# Patient Record
Sex: Female | Born: 1935 | Race: Black or African American | Hispanic: No | Marital: Married | Smoking: Never smoker
Health system: Southern US, Community
[De-identification: ages and names within clinical notes are randomized; demographics above are authoritative.]

## PROBLEM LIST (undated history)

## (undated) DIAGNOSIS — M1990 Unspecified osteoarthritis, unspecified site: Secondary | ICD-10-CM

## (undated) DIAGNOSIS — E876 Hypokalemia: Secondary | ICD-10-CM

## (undated) DIAGNOSIS — E538 Deficiency of other specified B group vitamins: Secondary | ICD-10-CM

## (undated) DIAGNOSIS — N189 Chronic kidney disease, unspecified: Secondary | ICD-10-CM

## (undated) DIAGNOSIS — I10 Essential (primary) hypertension: Secondary | ICD-10-CM

## (undated) DIAGNOSIS — K219 Gastro-esophageal reflux disease without esophagitis: Secondary | ICD-10-CM

## (undated) DIAGNOSIS — M810 Age-related osteoporosis without current pathological fracture: Secondary | ICD-10-CM

## (undated) DIAGNOSIS — M109 Gout, unspecified: Secondary | ICD-10-CM

## (undated) DIAGNOSIS — E559 Vitamin D deficiency, unspecified: Secondary | ICD-10-CM

## (undated) HISTORY — DX: Hypokalemia: E87.6

## (undated) HISTORY — DX: Deficiency of other specified B group vitamins: E53.8

## (undated) HISTORY — PX: JOINT REPLACEMENT: SHX530

## (undated) HISTORY — DX: Gout, unspecified: M10.9

## (undated) HISTORY — DX: Vitamin D deficiency, unspecified: E55.9

## (undated) HISTORY — DX: Essential (primary) hypertension: I10

## (undated) HISTORY — DX: Chronic kidney disease, unspecified: N18.9

## (undated) HISTORY — PX: ABDOMINAL HYSTERECTOMY: SHX81

## (undated) HISTORY — DX: Gastro-esophageal reflux disease without esophagitis: K21.9

## (undated) HISTORY — DX: Unspecified osteoarthritis, unspecified site: M19.90

## (undated) HISTORY — DX: Age-related osteoporosis without current pathological fracture: M81.0

---

## 2008-12-16 ENCOUNTER — Encounter: Payer: Self-pay | Admitting: General Practice

## 2009-01-12 ENCOUNTER — Encounter: Payer: Self-pay | Admitting: General Practice

## 2009-07-06 ENCOUNTER — Ambulatory Visit: Payer: Self-pay | Admitting: General Practice

## 2009-07-22 ENCOUNTER — Inpatient Hospital Stay: Payer: Self-pay | Admitting: General Practice

## 2009-12-15 ENCOUNTER — Ambulatory Visit: Payer: Self-pay | Admitting: General Practice

## 2009-12-30 ENCOUNTER — Inpatient Hospital Stay: Payer: Self-pay | Admitting: General Practice

## 2010-01-05 ENCOUNTER — Encounter: Payer: Self-pay | Admitting: Internal Medicine

## 2010-01-12 ENCOUNTER — Encounter: Payer: Self-pay | Admitting: Internal Medicine

## 2011-06-29 ENCOUNTER — Ambulatory Visit: Payer: Self-pay

## 2011-12-20 DIAGNOSIS — Q809 Congenital ichthyosis, unspecified: Secondary | ICD-10-CM | POA: Diagnosis not present

## 2011-12-20 DIAGNOSIS — G5790 Unspecified mononeuropathy of unspecified lower limb: Secondary | ICD-10-CM | POA: Diagnosis not present

## 2011-12-20 DIAGNOSIS — L819 Disorder of pigmentation, unspecified: Secondary | ICD-10-CM | POA: Diagnosis not present

## 2012-01-16 DIAGNOSIS — R6889 Other general symptoms and signs: Secondary | ICD-10-CM | POA: Diagnosis not present

## 2012-01-16 DIAGNOSIS — E559 Vitamin D deficiency, unspecified: Secondary | ICD-10-CM | POA: Diagnosis not present

## 2012-01-16 DIAGNOSIS — I10 Essential (primary) hypertension: Secondary | ICD-10-CM | POA: Diagnosis not present

## 2012-01-16 DIAGNOSIS — J069 Acute upper respiratory infection, unspecified: Secondary | ICD-10-CM | POA: Diagnosis not present

## 2012-05-03 DIAGNOSIS — H2510 Age-related nuclear cataract, unspecified eye: Secondary | ICD-10-CM | POA: Diagnosis not present

## 2012-06-01 ENCOUNTER — Ambulatory Visit: Payer: Self-pay

## 2012-06-01 DIAGNOSIS — M810 Age-related osteoporosis without current pathological fracture: Secondary | ICD-10-CM | POA: Diagnosis not present

## 2012-06-01 DIAGNOSIS — M109 Gout, unspecified: Secondary | ICD-10-CM | POA: Diagnosis not present

## 2012-06-01 DIAGNOSIS — R918 Other nonspecific abnormal finding of lung field: Secondary | ICD-10-CM | POA: Diagnosis not present

## 2012-06-01 DIAGNOSIS — Z1289 Encounter for screening for malignant neoplasm of other sites: Secondary | ICD-10-CM | POA: Diagnosis not present

## 2012-06-01 DIAGNOSIS — R6889 Other general symptoms and signs: Secondary | ICD-10-CM | POA: Diagnosis not present

## 2012-06-01 DIAGNOSIS — J984 Other disorders of lung: Secondary | ICD-10-CM | POA: Diagnosis not present

## 2012-06-01 DIAGNOSIS — M549 Dorsalgia, unspecified: Secondary | ICD-10-CM | POA: Diagnosis not present

## 2012-06-01 DIAGNOSIS — E876 Hypokalemia: Secondary | ICD-10-CM | POA: Diagnosis not present

## 2012-06-01 DIAGNOSIS — M1710 Unilateral primary osteoarthritis, unspecified knee: Secondary | ICD-10-CM | POA: Diagnosis not present

## 2012-06-01 DIAGNOSIS — I10 Essential (primary) hypertension: Secondary | ICD-10-CM | POA: Diagnosis not present

## 2012-06-01 DIAGNOSIS — Z0000 Encounter for general adult medical examination without abnormal findings: Secondary | ICD-10-CM | POA: Diagnosis not present

## 2012-06-01 DIAGNOSIS — E559 Vitamin D deficiency, unspecified: Secondary | ICD-10-CM | POA: Diagnosis not present

## 2012-06-05 DIAGNOSIS — E559 Vitamin D deficiency, unspecified: Secondary | ICD-10-CM | POA: Diagnosis not present

## 2012-06-12 DIAGNOSIS — E559 Vitamin D deficiency, unspecified: Secondary | ICD-10-CM | POA: Diagnosis not present

## 2012-06-19 DIAGNOSIS — E559 Vitamin D deficiency, unspecified: Secondary | ICD-10-CM | POA: Diagnosis not present

## 2012-06-26 DIAGNOSIS — E559 Vitamin D deficiency, unspecified: Secondary | ICD-10-CM | POA: Diagnosis not present

## 2012-06-29 ENCOUNTER — Ambulatory Visit: Payer: Self-pay

## 2012-06-29 DIAGNOSIS — R928 Other abnormal and inconclusive findings on diagnostic imaging of breast: Secondary | ICD-10-CM | POA: Diagnosis not present

## 2012-06-29 DIAGNOSIS — Z1231 Encounter for screening mammogram for malignant neoplasm of breast: Secondary | ICD-10-CM | POA: Diagnosis not present

## 2012-07-27 DIAGNOSIS — E559 Vitamin D deficiency, unspecified: Secondary | ICD-10-CM | POA: Diagnosis not present

## 2012-08-30 DIAGNOSIS — E559 Vitamin D deficiency, unspecified: Secondary | ICD-10-CM | POA: Diagnosis not present

## 2012-09-28 DIAGNOSIS — E559 Vitamin D deficiency, unspecified: Secondary | ICD-10-CM | POA: Diagnosis not present

## 2012-10-30 DIAGNOSIS — E559 Vitamin D deficiency, unspecified: Secondary | ICD-10-CM | POA: Diagnosis not present

## 2012-12-03 DIAGNOSIS — E538 Deficiency of other specified B group vitamins: Secondary | ICD-10-CM | POA: Diagnosis not present

## 2012-12-03 DIAGNOSIS — I10 Essential (primary) hypertension: Secondary | ICD-10-CM | POA: Diagnosis not present

## 2013-01-30 DIAGNOSIS — L989 Disorder of the skin and subcutaneous tissue, unspecified: Secondary | ICD-10-CM | POA: Diagnosis not present

## 2013-01-30 DIAGNOSIS — L280 Lichen simplex chronicus: Secondary | ICD-10-CM | POA: Diagnosis not present

## 2013-01-30 DIAGNOSIS — L738 Other specified follicular disorders: Secondary | ICD-10-CM | POA: Diagnosis not present

## 2013-06-04 DIAGNOSIS — I10 Essential (primary) hypertension: Secondary | ICD-10-CM | POA: Diagnosis not present

## 2013-06-04 DIAGNOSIS — Z0000 Encounter for general adult medical examination without abnormal findings: Secondary | ICD-10-CM | POA: Diagnosis not present

## 2013-06-04 DIAGNOSIS — E538 Deficiency of other specified B group vitamins: Secondary | ICD-10-CM | POA: Diagnosis not present

## 2013-06-04 DIAGNOSIS — E7800 Pure hypercholesterolemia, unspecified: Secondary | ICD-10-CM | POA: Diagnosis not present

## 2013-07-02 ENCOUNTER — Ambulatory Visit: Payer: Self-pay

## 2013-07-02 DIAGNOSIS — Z1231 Encounter for screening mammogram for malignant neoplasm of breast: Secondary | ICD-10-CM | POA: Diagnosis not present

## 2013-08-22 DIAGNOSIS — H35369 Drusen (degenerative) of macula, unspecified eye: Secondary | ICD-10-CM | POA: Diagnosis not present

## 2013-09-30 DIAGNOSIS — L989 Disorder of the skin and subcutaneous tissue, unspecified: Secondary | ICD-10-CM | POA: Diagnosis not present

## 2013-11-19 DIAGNOSIS — E538 Deficiency of other specified B group vitamins: Secondary | ICD-10-CM | POA: Diagnosis not present

## 2013-11-19 DIAGNOSIS — M109 Gout, unspecified: Secondary | ICD-10-CM | POA: Diagnosis not present

## 2013-11-19 DIAGNOSIS — E559 Vitamin D deficiency, unspecified: Secondary | ICD-10-CM | POA: Diagnosis not present

## 2013-11-19 DIAGNOSIS — I10 Essential (primary) hypertension: Secondary | ICD-10-CM | POA: Diagnosis not present

## 2013-11-19 DIAGNOSIS — I129 Hypertensive chronic kidney disease with stage 1 through stage 4 chronic kidney disease, or unspecified chronic kidney disease: Secondary | ICD-10-CM | POA: Diagnosis not present

## 2013-12-18 DIAGNOSIS — J40 Bronchitis, not specified as acute or chronic: Secondary | ICD-10-CM | POA: Diagnosis not present

## 2013-12-27 ENCOUNTER — Ambulatory Visit: Payer: Self-pay

## 2013-12-27 DIAGNOSIS — K7689 Other specified diseases of liver: Secondary | ICD-10-CM | POA: Diagnosis not present

## 2013-12-27 DIAGNOSIS — J984 Other disorders of lung: Secondary | ICD-10-CM | POA: Diagnosis not present

## 2013-12-27 DIAGNOSIS — R918 Other nonspecific abnormal finding of lung field: Secondary | ICD-10-CM | POA: Diagnosis not present

## 2013-12-27 DIAGNOSIS — N183 Chronic kidney disease, stage 3 unspecified: Secondary | ICD-10-CM | POA: Diagnosis not present

## 2013-12-31 DIAGNOSIS — J189 Pneumonia, unspecified organism: Secondary | ICD-10-CM | POA: Diagnosis not present

## 2014-05-21 DIAGNOSIS — E559 Vitamin D deficiency, unspecified: Secondary | ICD-10-CM | POA: Diagnosis not present

## 2014-05-21 DIAGNOSIS — R7301 Impaired fasting glucose: Secondary | ICD-10-CM | POA: Diagnosis not present

## 2014-05-21 DIAGNOSIS — I10 Essential (primary) hypertension: Secondary | ICD-10-CM | POA: Diagnosis not present

## 2014-05-21 DIAGNOSIS — E538 Deficiency of other specified B group vitamins: Secondary | ICD-10-CM | POA: Diagnosis not present

## 2014-05-21 DIAGNOSIS — Z01419 Encounter for gynecological examination (general) (routine) without abnormal findings: Secondary | ICD-10-CM | POA: Diagnosis not present

## 2014-05-21 DIAGNOSIS — M8440XA Pathological fracture, unspecified site, initial encounter for fracture: Secondary | ICD-10-CM | POA: Diagnosis not present

## 2014-05-21 DIAGNOSIS — I129 Hypertensive chronic kidney disease with stage 1 through stage 4 chronic kidney disease, or unspecified chronic kidney disease: Secondary | ICD-10-CM | POA: Diagnosis not present

## 2014-05-21 DIAGNOSIS — Z0000 Encounter for general adult medical examination without abnormal findings: Secondary | ICD-10-CM | POA: Diagnosis not present

## 2014-05-21 DIAGNOSIS — N183 Chronic kidney disease, stage 3 unspecified: Secondary | ICD-10-CM | POA: Diagnosis not present

## 2014-06-03 DIAGNOSIS — Z1211 Encounter for screening for malignant neoplasm of colon: Secondary | ICD-10-CM | POA: Diagnosis not present

## 2014-06-26 ENCOUNTER — Ambulatory Visit: Payer: Self-pay | Admitting: Gastroenterology

## 2014-06-26 DIAGNOSIS — M810 Age-related osteoporosis without current pathological fracture: Secondary | ICD-10-CM | POA: Diagnosis not present

## 2014-06-26 DIAGNOSIS — D126 Benign neoplasm of colon, unspecified: Secondary | ICD-10-CM | POA: Diagnosis not present

## 2014-06-26 DIAGNOSIS — K5730 Diverticulosis of large intestine without perforation or abscess without bleeding: Secondary | ICD-10-CM | POA: Diagnosis not present

## 2014-06-26 DIAGNOSIS — K219 Gastro-esophageal reflux disease without esophagitis: Secondary | ICD-10-CM | POA: Diagnosis not present

## 2014-06-26 DIAGNOSIS — I10 Essential (primary) hypertension: Secondary | ICD-10-CM | POA: Diagnosis not present

## 2014-06-26 DIAGNOSIS — Z96659 Presence of unspecified artificial knee joint: Secondary | ICD-10-CM | POA: Diagnosis not present

## 2014-06-26 DIAGNOSIS — Z79899 Other long term (current) drug therapy: Secondary | ICD-10-CM | POA: Diagnosis not present

## 2014-06-26 DIAGNOSIS — M109 Gout, unspecified: Secondary | ICD-10-CM | POA: Diagnosis not present

## 2014-06-26 DIAGNOSIS — Z1211 Encounter for screening for malignant neoplasm of colon: Secondary | ICD-10-CM | POA: Diagnosis not present

## 2014-06-26 DIAGNOSIS — M4120 Other idiopathic scoliosis, site unspecified: Secondary | ICD-10-CM | POA: Diagnosis not present

## 2014-06-27 LAB — PATHOLOGY REPORT

## 2014-09-04 ENCOUNTER — Ambulatory Visit: Payer: Self-pay

## 2014-09-04 DIAGNOSIS — Z1231 Encounter for screening mammogram for malignant neoplasm of breast: Secondary | ICD-10-CM | POA: Diagnosis not present

## 2014-12-02 DIAGNOSIS — E538 Deficiency of other specified B group vitamins: Secondary | ICD-10-CM | POA: Diagnosis not present

## 2014-12-02 DIAGNOSIS — M19049 Primary osteoarthritis, unspecified hand: Secondary | ICD-10-CM | POA: Diagnosis not present

## 2014-12-02 DIAGNOSIS — R739 Hyperglycemia, unspecified: Secondary | ICD-10-CM | POA: Diagnosis not present

## 2014-12-02 DIAGNOSIS — I10 Essential (primary) hypertension: Secondary | ICD-10-CM | POA: Diagnosis not present

## 2015-05-28 ENCOUNTER — Other Ambulatory Visit: Payer: Self-pay | Admitting: Family Medicine

## 2015-06-02 ENCOUNTER — Telehealth: Payer: Self-pay | Admitting: Unknown Physician Specialty

## 2015-06-02 MED ORDER — LANSOPRAZOLE 30 MG PO CPDR: 30.0000 mg | DELAYED_RELEASE_CAPSULE | Freq: Every day | ORAL | Status: DC

## 2015-06-02 NOTE — Telephone Encounter (Signed)
Pt called stated she needs refill on Lansoprazole. Pharm is [facility]. Thanks.

## 2015-06-02 NOTE — Telephone Encounter (Signed)
Called and spoke to patient about medication. Patient stated she does not take omeprazole, she used to take prevacid which she stated is a substitute for the lansoprazole. She stated she takes one pill a day and that she got it from here.

## 2015-06-02 NOTE — Telephone Encounter (Signed)
Routing to provider  

## 2015-06-02 NOTE — Telephone Encounter (Signed)
It's not on the list.  Omeprazole is on the list.  Which is she on?  How much does she take? Did she get it from somewhere else?

## 2015-06-10 ENCOUNTER — Encounter: Payer: Self-pay | Admitting: Unknown Physician Specialty

## 2015-06-10 ENCOUNTER — Other Ambulatory Visit: Payer: Self-pay | Admitting: Unknown Physician Specialty

## 2015-06-10 ENCOUNTER — Telehealth: Payer: Self-pay

## 2015-06-10 ENCOUNTER — Ambulatory Visit (INDEPENDENT_AMBULATORY_CARE_PROVIDER_SITE_OTHER): Payer: Medicare Other | Admitting: Unknown Physician Specialty

## 2015-06-10 DIAGNOSIS — E538 Deficiency of other specified B group vitamins: Secondary | ICD-10-CM | POA: Insufficient documentation

## 2015-06-10 DIAGNOSIS — N183 Chronic kidney disease, stage 3 unspecified: Secondary | ICD-10-CM | POA: Insufficient documentation

## 2015-06-10 DIAGNOSIS — E559 Vitamin D deficiency, unspecified: Secondary | ICD-10-CM | POA: Insufficient documentation

## 2015-06-10 DIAGNOSIS — Z0000 Encounter for general adult medical examination without abnormal findings: Secondary | ICD-10-CM

## 2015-06-10 DIAGNOSIS — N644 Mastodynia: Secondary | ICD-10-CM

## 2015-06-10 DIAGNOSIS — M109 Gout, unspecified: Secondary | ICD-10-CM | POA: Insufficient documentation

## 2015-06-10 DIAGNOSIS — Z1239 Encounter for other screening for malignant neoplasm of breast: Secondary | ICD-10-CM

## 2015-06-10 DIAGNOSIS — I129 Hypertensive chronic kidney disease with stage 1 through stage 4 chronic kidney disease, or unspecified chronic kidney disease: Secondary | ICD-10-CM | POA: Diagnosis not present

## 2015-06-10 DIAGNOSIS — I10 Essential (primary) hypertension: Secondary | ICD-10-CM | POA: Insufficient documentation

## 2015-06-10 DIAGNOSIS — R300 Dysuria: Secondary | ICD-10-CM

## 2015-06-10 LAB — UA/M W/RFLX CULTURE, ROUTINE
BILIRUBIN UA: NEGATIVE
GLUCOSE, UA: NEGATIVE
Ketones, UA: NEGATIVE
Leukocytes, UA: NEGATIVE
Nitrite, UA: NEGATIVE
PROTEIN UA: NEGATIVE
RBC UA: NEGATIVE
Specific Gravity, UA: 1.025 (ref 1.005–1.030)
UUROB: 0.2 mg/dL (ref 0.2–1.0)
pH, UA: 5 (ref 5.0–7.5)

## 2015-06-10 MED ORDER — LANSOPRAZOLE 30 MG PO CPDR: 30.0000 mg | DELAYED_RELEASE_CAPSULE | Freq: Every day | ORAL | Status: DC

## 2015-06-10 MED ORDER — NIFEDIPINE ER 60 MG PO TB24: 60.0000 mg | ORAL_TABLET | Freq: Every day | ORAL | Status: DC

## 2015-06-10 NOTE — Progress Notes (Signed)
Patient: Cynthia Hurst, Female    DOB: 16-Mar-1935, 80 y.o.   MRN: [id number]  Visit Date: 06/10/2015  Today's Provider: Kathrine Haddock, NP   Chief Complaint  Patient presents with  . Medicare Wellness    Subjective:   Cynthia Hurst is a 80 y.o. female who presents today for her Subsequent Annual Wellness Visit.   Hypertension This is a chronic (High today.  108/68 last time checked at home) problem. Condition status: Has been stable but high this AM. Pertinent negatives include no anxiety, blurred vision, chest pain, headaches, malaise/fatigue, neck pain, orthopnea, palpitations, peripheral edema, PND, shortness of breath or sweats. There are no associated agents to hypertension. There are no compliance problems.     Review of Systems  Constitutional: Negative for malaise/fatigue.  Eyes: Negative for blurred vision.  Respiratory: Negative for shortness of breath.   Cardiovascular: Negative for chest pain, palpitations, orthopnea and PND.  Musculoskeletal: Negative for neck pain.  Neurological: Negative for headaches.    @MEDHX @  Past Surgical History  Procedure Laterality Date  . Joint replacement  [id number]    KNEE  . Abdominal hysterectomy      PARTIAL    Family History  Problem Relation Age of Onset  . Heart disease Mother   . Cancer Father   . Mental illness Father     History   Social History  . Marital Status: Married    Spouse Name: N/A  . Number of Children: N/A  . Years of Education: N/A   Occupational History  . Not on file.   Social History Main Topics  . Smoking status: Never Smoker   . Smokeless tobacco: Never Used  . Alcohol Use: No  . Drug Use: No  . Sexual Activity: No   Other Topics Concern  . Not on file   Social History Narrative    Outpatient Encounter Prescriptions as of 06/10/2015  Medication Sig  . allopurinol (ZYLOPRIM) 300 MG tablet Take 300 mg by mouth daily.  Marland Kitchen atenolol (TENORMIN) 100 MG tablet Take 100 mg by mouth  daily.  . cholecalciferol (VITAMIN D) 1000 UNITS tablet Take 1,000 Units by mouth daily.  . Cyanocobalamin (VITAMIN B 12 PO) Take 1 capsule by mouth daily.  . lansoprazole (PREVACID) 30 MG capsule Take 1 capsule (30 mg total) by mouth daily at 12 noon.  Marland Kitchen NIFEdipine (PROCARDIA-XL/ADALAT CC) 60 MG 24 hr tablet Take 1 tablet (60 mg total) by mouth daily.  . potassium chloride SA (K-DUR,KLOR-CON) 20 MEQ tablet Take 20 mEq by mouth daily.  . raloxifene (EVISTA) 60 MG tablet TAKE ONE TABLET BY MOUTH ONCE DAILY  . [DISCONTINUED] lansoprazole (PREVACID) 30 MG capsule Take 1 capsule (30 mg total) by mouth daily at 12 noon.  . [DISCONTINUED] NIFEdipine (PROCARDIA-XL/ADALAT CC) 60 MG 24 hr tablet Take 60 mg by mouth daily.   No facility-administered encounter medications on file as of 06/10/2015.    Functional Ability / Safety Screening 1.  Was the timed Get Up and Go test longer than 30 seconds?  no 2.  Does the patient need help with the phone, transportation, shopping,      preparing meals, housework, laundry, medications, or managing money?  no 3.  Does the patient's home have:  loose throw rugs in the hallway?   no      Grab bars in the bathroom? no      Handrails on the stairs?   no      Poor lighting?   no  4.  Has the patient noticed any hearing difficulties?   no  Fall Risk Assessment See under rooming  Depression Screen See under rooming Depression screen [facility] 2/9 06/10/2015  Decreased Interest 0  Down, Depressed, Hopeless 0  PHQ - 2 Score 0    Advanced Directives Does patient have a HCPOA?    yes If yes, name and contact information:  Does patient have a living will or MOST form?  yes  Objective:   Vitals: BP 140/75 mmHg  Pulse 60  Temp(Src) 97.6 F (36.4 C)  Ht 5\' 3"  (1.6 m)  Wt 188 lb 9.6 oz (85.548 kg)  BMI 33.42 kg/m2  SpO2 93%  LMP  (LMP Unknown) Body mass index is 33.42 kg/(m^2). No exam data present   BP 140/75 on recheck  Physical Exam  Constitutional: She  is oriented to person, place, and time. She appears well-developed and well-nourished. No distress.  HENT:  Head: Normocephalic and atraumatic.  Eyes: Conjunctivae and lids are normal. Right eye exhibits no discharge. Left eye exhibits no discharge. No scleral icterus.  Cardiovascular: Normal rate and regular rhythm.   Pulmonary/Chest: Effort normal and breath sounds normal. No respiratory distress.  Abdominal: Normal appearance and bowel sounds are normal. She exhibits no distension. There is no splenomegaly or hepatomegaly. There is no tenderness.  Musculoskeletal: Normal range of motion.  Neurological: She is alert and oriented to person, place, and time.  Skin: Skin is intact. No rash noted. No pallor.  Psychiatric: She has a normal mood and affect. Her behavior is normal. Judgment and thought content normal.   Mood/affect:  normal Appearance:  normal  Cognitive Testing - 6-CIT  Correct? Score   What year is it? yes 0 Yes = 0    No = 4  What month is it? yes 0 Yes = 0    No = 3  Remember:     [place]ia [place], [place], [place]     What time is it? yes 0 Yes = 0    No = 3  Count backwards from 20 to 1 yes 0 Correct = 0    1 error = 2   More than 1 error = 4  Say the months of the year in reverse. yes 0 Correct = 0    1 error = 2   More than 1 error = 4  What address did I ask you to remember? yes 0 Correct = 0  1 error = 2    2 error = 4    3 error = 6    4 error = 8    All wrong = 10       TOTAL SCORE  0/28   Interpretation:  Normal  Normal (0-7) Abnormal (8-28)    Assessment & Plan:     Annual Wellness Visit  Reviewed patient's Family Medical History Reviewed and updated list of patient's medical providers Assessment of cognitive impairment was done Assessed patient's functional ability Established a written schedule for health screening [place] Completed and Reviewed  Exercise Activities and Dietary recommendations Goals    None       Immunization History  Administered Date(s) Administered  . Pneumococcal Polysaccharide-23 03/23/2005    Health Maintenance  Topic Date Due  . TETANUS/TDAP  01/28/1956  . ZOSTAVAX  01/27/1997  . DEXA SCAN  01/27/2002  . PNA vac Low Risk Adult (2 of 2 - PCV13) 03/23/2006  . INFLUENZA VACCINE  07/13/2015  .  COLONOSCOPY  07/02/2024     Discussed health benefits of physical activity, and encouraged her to engage in regular exercise appropriate for her age and condition.     Meds ordered this encounter  Medications  . NIFEdipine (PROCARDIA-XL/ADALAT CC) 60 MG 24 hr tablet    Sig: Take 1 tablet (60 mg total) by mouth daily.    Dispense:  15 tablet    Refill:  0  . lansoprazole (PREVACID) 30 MG capsule    Sig: Take 1 capsule (30 mg total) by mouth daily at 12 noon.    Dispense:  15 capsule    Refill:  0    Current outpatient prescriptions:  .  allopurinol (ZYLOPRIM) 300 MG tablet, Take 300 mg by mouth daily., Disp: , Rfl:  .  atenolol (TENORMIN) 100 MG tablet, Take 100 mg by mouth daily., Disp: , Rfl:  .  cholecalciferol (VITAMIN D) 1000 UNITS tablet, Take 1,000 Units by mouth daily., Disp: , Rfl:  .  Cyanocobalamin (VITAMIN B 12 PO), Take 1 capsule by mouth daily., Disp: , Rfl:  .  lansoprazole (PREVACID) 30 MG capsule, Take 1 capsule (30 mg total) by mouth daily at 12 noon., Disp: 15 capsule, Rfl: 0 .  NIFEdipine (PROCARDIA-XL/ADALAT CC) 60 MG 24 hr tablet, Take 1 tablet (60 mg total) by mouth daily., Disp: 15 tablet, Rfl: 0 .  potassium chloride SA (K-DUR,KLOR-CON) 20 MEQ tablet, Take 20 mEq by mouth daily., Disp: , Rfl:  .  raloxifene (EVISTA) 60 MG tablet, TAKE ONE TABLET BY MOUTH ONCE DAILY, Disp: 90 tablet, Rfl: 0 Medications Discontinued During This Encounter  Medication Reason  . NIFEdipine (PROCARDIA-XL/ADALAT CC) 60 MG 24 hr tablet Reorder  . lansoprazole (PREVACID) 30 MG capsule Reorder    Next Medicare Wellness Visit in 12+ months

## 2015-06-10 NOTE — Telephone Encounter (Signed)
Patient called and stated that her prescriptions were sent to the wrong pharmacy. So I asked cheryl and got them sent to the right pharmacy.

## 2015-06-10 NOTE — Assessment & Plan Note (Signed)
Stable today on recheck

## 2015-06-10 NOTE — Telephone Encounter (Signed)
Called and let patient know that her medications were available at the [facility] on [address].

## 2015-06-11 ENCOUNTER — Telehealth: Payer: Self-pay

## 2015-06-11 ENCOUNTER — Other Ambulatory Visit: Payer: Self-pay | Admitting: Unknown Physician Specialty

## 2015-06-11 DIAGNOSIS — Z1239 Encounter for other screening for malignant neoplasm of breast: Secondary | ICD-10-CM

## 2015-06-11 LAB — COMPREHENSIVE METABOLIC PANEL
A/G RATIO: 1.5 (ref 1.1–2.5)
ALK PHOS: 97 IU/L (ref 39–117)
ALT: 10 IU/L (ref 0–32)
AST: 19 IU/L (ref 0–40)
Albumin: 3.9 g/dL (ref 3.5–4.8)
BUN / CREAT RATIO: 14 (ref 11–26)
BUN: 14 mg/dL (ref 8–27)
Bilirubin Total: 0.6 mg/dL (ref 0.0–1.2)
CHLORIDE: 102 mmol/L (ref 97–108)
CO2: 27 mmol/L (ref 18–29)
Calcium: 9.9 mg/dL (ref 8.7–10.3)
Creatinine, Ser: 1.02 mg/dL — ABNORMAL HIGH (ref 0.57–1.00)
GFR calc Af Amer: 61 mL/min/{1.73_m2} (ref >59)
GFR, EST NON AFRICAN AMERICAN: 53 mL/min/{1.73_m2} — AB (ref >59)
GLUCOSE: 87 mg/dL (ref 65–99)
Globulin, Total: 2.6 g/dL (ref 1.5–4.5)
POTASSIUM: 4.2 mmol/L (ref 3.5–5.2)
SODIUM: 141 mmol/L (ref 134–144)
Total Protein: 6.5 g/dL (ref 6.0–8.5)

## 2015-06-11 LAB — LIPID PANEL W/O CHOL/HDL RATIO
Cholesterol, Total: 153 mg/dL (ref 100–199)
HDL: 56 mg/dL (ref >39)
LDL Calculated: 71 mg/dL (ref 0–99)
TRIGLYCERIDES: 131 mg/dL (ref 0–149)
VLDL Cholesterol Cal: 26 mg/dL (ref 5–40)

## 2015-06-11 LAB — URIC ACID: URIC ACID: 3.4 mg/dL (ref 2.5–7.1)

## 2015-06-11 NOTE — Telephone Encounter (Signed)
Called [facility] home delivery and discontinued medications.

## 2015-06-16 ENCOUNTER — Other Ambulatory Visit: Payer: Self-pay | Admitting: Unknown Physician Specialty

## 2015-06-16 DIAGNOSIS — Z1231 Encounter for screening mammogram for malignant neoplasm of breast: Secondary | ICD-10-CM

## 2015-06-18 ENCOUNTER — Telehealth: Payer: Self-pay | Admitting: Unknown Physician Specialty

## 2015-06-18 NOTE — Telephone Encounter (Signed)
Pt called stated at her last visit she informed Malachy Mood she was out of her medication and was told Malachy Mood would fax it in. Pharm stated they have not yet received it. Pt has been without her medication since her appt.  Medication: Iraloxifene   Pharm: [facility].

## 2015-06-19 NOTE — Telephone Encounter (Signed)
Called patient's pharmacy and asked them about the medication, and the pharmacist told me that the medication was shipped on Tuesday June 16, 2015. So I called and informed the patient about this and let her ow she should be getting the medication soon. I also informed the patient that her mammogram was ordered and gave her the number to call and schedule her appointment.

## 2015-07-09 ENCOUNTER — Telehealth: Payer: Self-pay

## 2015-07-09 NOTE — Telephone Encounter (Signed)
-----   Message from Cynthia Haddock, NP sent at 07/09/2015  9:25 AM EDT ----- Please follow up on her mammogram that is marked overdue

## 2015-07-09 NOTE — Telephone Encounter (Signed)
Patient is not overdue for mammogram as it was done in September of 2015.

## 2015-07-15 ENCOUNTER — Telehealth: Payer: Self-pay | Admitting: Unknown Physician Specialty

## 2015-07-15 MED ORDER — LANSOPRAZOLE 30 MG PO CPDR: 30.0000 mg | DELAYED_RELEASE_CAPSULE | Freq: Every day | ORAL | Status: DC

## 2015-07-15 NOTE — Telephone Encounter (Signed)
Pt received 30 day supply of her prevacaid 30mg , normally she gets 90 days.  She is going out of town and does not have enough medication.  Please call her mail order [phone].

## 2015-07-15 NOTE — Telephone Encounter (Signed)
Tried to call ad ask patient where medication needed to be sent to but patient was not available. The gentleman I spoke to said that the patient would call us back when she got in.

## 2015-07-15 NOTE — Telephone Encounter (Signed)
I wrote for 90.  But, not sure where to send it to.

## 2015-07-17 MED ORDER — LANSOPRAZOLE 30 MG PO CPDR: 30.0000 mg | DELAYED_RELEASE_CAPSULE | Freq: Every day | ORAL | Status: DC

## 2015-07-17 NOTE — Telephone Encounter (Signed)
Patient returned call and stated that [facility] needed [facility] so they can figure out what's going on with this patients medication.

## 2015-07-17 NOTE — Telephone Encounter (Signed)
Called [facility] mail order and they stated they the rx can be sent to the local pharmacy and the patient can pick it up their since she is going out of town. Please send the 90 day supply to [facility] on [address].

## 2015-08-10 DIAGNOSIS — H2513 Age-related nuclear cataract, bilateral: Secondary | ICD-10-CM | POA: Diagnosis not present

## 2015-09-01 ENCOUNTER — Other Ambulatory Visit: Payer: Self-pay | Admitting: Family Medicine

## 2015-09-07 ENCOUNTER — Other Ambulatory Visit: Payer: Self-pay | Admitting: Unknown Physician Specialty

## 2015-09-07 ENCOUNTER — Ambulatory Visit
Admission: RE | Admit: 2015-09-07 | Discharge: 2015-09-07 | Disposition: A | Discharge status: Home / Self Care | Payer: Medicare Other | Source: Ambulatory Visit | Attending: Unknown Physician Specialty | Admitting: Unknown Physician Specialty

## 2015-09-07 DIAGNOSIS — Z1231 Encounter for screening mammogram for malignant neoplasm of breast: Secondary | ICD-10-CM | POA: Diagnosis not present

## 2015-09-07 MED ORDER — NIFEDIPINE ER 60 MG PO TB24: 60.0000 mg | ORAL_TABLET | Freq: Every day | ORAL | Status: DC

## 2015-09-07 NOTE — Telephone Encounter (Signed)
Pt called needs refill on: raloxitene, and nifedipine. Pharm is [facility]. Thanks.

## 2015-09-07 NOTE — Telephone Encounter (Signed)
Dr. Jeananne Rama just refilled the raloxifine on 09/01/15. Patient was last seen 06/10/15 and pharmacy is the [facility].

## 2015-09-08 ENCOUNTER — Telehealth: Payer: Self-pay | Admitting: Unknown Physician Specialty

## 2015-09-08 MED ORDER — NIFEDIPINE ER 60 MG PO TB24: 60.0000 mg | ORAL_TABLET | Freq: Every day | ORAL | Status: DC

## 2015-09-08 NOTE — Telephone Encounter (Signed)
Called and let patient know rx was re-written for 90 day supply.

## 2015-09-08 NOTE — Telephone Encounter (Signed)
Pt called concerned that the medication that was sent in for her was only a 30 day supply, she normally gets 90 day supply (nifedipine). Please call pt ASAP. Thanks.

## 2015-09-08 NOTE — Telephone Encounter (Signed)
Rewritten for #90

## 2015-09-08 NOTE — Telephone Encounter (Signed)
Routing to provider. 15 day supply was written yesterday, patient requests 90 day supply.

## 2015-10-07 ENCOUNTER — Telehealth: Payer: Self-pay | Admitting: Unknown Physician Specialty

## 2015-10-07 NOTE — Telephone Encounter (Signed)
Pt would like a call back so she can verify the rx for her vitamin b12

## 2015-10-08 NOTE — Telephone Encounter (Signed)
Called and spoke to patient about medication. She states she gets OTC vitamin b12 and when she took the last pill, she accidentally threw the bottle away so she called to find out the dosage of the vitamin b12. I looked in her chart and let her know that it was 1000 mcg.

## 2015-10-30 ENCOUNTER — Other Ambulatory Visit: Payer: Self-pay | Admitting: Family Medicine

## 2015-10-30 ENCOUNTER — Other Ambulatory Visit: Payer: Self-pay | Admitting: Unknown Physician Specialty

## 2015-10-30 NOTE — Telephone Encounter (Signed)
LAST VISIT: 06/10/2015 UPCOMING APPT: 12/11/2015  Request for allopurinol 300 mg tab

## 2015-11-19 ENCOUNTER — Other Ambulatory Visit: Payer: Self-pay | Admitting: Unknown Physician Specialty

## 2015-11-24 ENCOUNTER — Other Ambulatory Visit: Payer: Self-pay | Admitting: Unknown Physician Specialty

## 2015-11-24 MED ORDER — RALOXIFENE HCL 60 MG PO TABS: 60.0000 mg | ORAL_TABLET | Freq: Every day | ORAL | Status: DC

## 2015-11-24 NOTE — Telephone Encounter (Signed)
Patient has upcoming appointment 12/11/15 and pharmacy is [facility].

## 2015-11-24 NOTE — Telephone Encounter (Signed)
Pt came by stated she needs a refill on Raloxifene. Pharm is [facility]. Thanks.

## 2015-12-11 ENCOUNTER — Encounter: Payer: Self-pay | Admitting: Unknown Physician Specialty

## 2015-12-11 ENCOUNTER — Ambulatory Visit (INDEPENDENT_AMBULATORY_CARE_PROVIDER_SITE_OTHER): Payer: Medicare Other | Admitting: Unknown Physician Specialty

## 2015-12-11 VITALS — BP 129/74 | HR 59 | Temp 97.4°F | Ht 62.7 in | Wt 183.8 lb

## 2015-12-11 DIAGNOSIS — E785 Hyperlipidemia, unspecified: Secondary | ICD-10-CM | POA: Diagnosis not present

## 2015-12-11 DIAGNOSIS — N183 Chronic kidney disease, stage 3 unspecified: Secondary | ICD-10-CM

## 2015-12-11 DIAGNOSIS — M109 Gout, unspecified: Secondary | ICD-10-CM | POA: Diagnosis not present

## 2015-12-11 DIAGNOSIS — R252 Cramp and spasm: Secondary | ICD-10-CM | POA: Diagnosis not present

## 2015-12-11 DIAGNOSIS — I129 Hypertensive chronic kidney disease with stage 1 through stage 4 chronic kidney disease, or unspecified chronic kidney disease: Secondary | ICD-10-CM | POA: Diagnosis not present

## 2015-12-11 DIAGNOSIS — N189 Chronic kidney disease, unspecified: Secondary | ICD-10-CM | POA: Diagnosis not present

## 2015-12-11 LAB — LIPID PANEL PICCOLO, WAIVED
CHOL/HDL RATIO PICCOLO,WAIVE: 2.9 mg/dL
CHOLESTEROL PICCOLO, WAIVED: 170 mg/dL (ref <200)
HDL CHOL PICCOLO, WAIVED: 58 mg/dL (ref >59)
LDL CHOL CALC PICCOLO WAIVED: 76 mg/dL (ref <100)
TRIGLYCERIDES PICCOLO,WAIVED: 179 mg/dL — AB (ref <150)
VLDL CHOL CALC PICCOLO,WAIVE: 36 mg/dL — AB (ref <30)

## 2015-12-11 LAB — MICROALBUMIN, URINE WAIVED
Creatinine, Urine Waived: 300 mg/dL (ref 10–300)
Microalb, Ur Waived: 30 mg/L — ABNORMAL HIGH (ref 0–19)

## 2015-12-11 MED ORDER — ATENOLOL 100 MG PO TABS: 100.0000 mg | ORAL_TABLET | Freq: Every day | ORAL | Status: DC

## 2015-12-11 MED ORDER — OMEPRAZOLE 20 MG PO CPDR: 20.0000 mg | DELAYED_RELEASE_CAPSULE | Freq: Every day | ORAL | Status: DC

## 2015-12-11 MED ORDER — ALLOPURINOL 300 MG PO TABS: 300.0000 mg | ORAL_TABLET | Freq: Every day | ORAL | Status: DC

## 2015-12-11 MED ORDER — RALOXIFENE HCL 60 MG PO TABS: 60.0000 mg | ORAL_TABLET | Freq: Every day | ORAL | Status: DC

## 2015-12-11 MED ORDER — NIFEDIPINE ER 60 MG PO TB24: 60.0000 mg | ORAL_TABLET | Freq: Every day | ORAL | Status: DC

## 2015-12-11 MED ORDER — POTASSIUM CHLORIDE CRYS ER 20 MEQ PO TBCR: 20.0000 meq | EXTENDED_RELEASE_TABLET | Freq: Every day | ORAL | Status: DC

## 2015-12-11 NOTE — Assessment & Plan Note (Signed)
Stable, continue present medications.   

## 2015-12-11 NOTE — Assessment & Plan Note (Signed)
OTC treatments discussed

## 2015-12-11 NOTE — Assessment & Plan Note (Signed)
Check uric acid. 

## 2015-12-11 NOTE — Patient Instructions (Signed)
f you have chronic pain and are looking for alternatives to medication and surgery, you have a lot of options.   However, not all alternative pain treatments work. Some can even be risky. Some alternative treatments may help with pain from bad backs, osteoarthritis, and headaches, but have no effect on chronic pain from fibromyalgia or diabetic nerve damage.  Here's a rundown of the most commonly used alternative treatments for chronic pain.  Acupuncture. Once seen as bizarre, acupuncture is rapidly becoming a mainstream treatment for pain. Studies have found that it works for pain caused by many conditions, including fibromyalgia, osteoarthritis, back injuries, and sports injuries. How does it work? No one's quite sure. It could release pain-numbing chemicals in the body. Or it might block the pain signals coming from the nerves.  Exercise. Motion is lotion Going for a walk or swim isn't a treatment, exactly. But regular physical activity has big benefits for people with many different painful conditions. Study after study has found that physical activity can help relieve chronic pain, as well as boost energy and mood.   This is particularly true of pain related to arthritis .    Chiropractic manipulation. Although mainstream medicine has traditionally regarded spinal manipulation with suspicion, it's becoming a more accepted treatment.  I think many people respond will th chiropractic treatment.    Supplements and vitamins. There is evidence that certain dietary supplements and vitamins can help with certain types of pain. Fish oil and flaxseed oil is often used to reduce pain associated with swelling. Topical capsaicin, derived from chili peppers, may help with arthritis, diabetic nerve pain, and other conditions. There's evidence that glucosamine can help relieve moderate to severe pain from osteoarthritis in the knee.  A spice Tumeric is used my many to treat chronic pain.  Curcumin is the active  ingredient and thought to have anti-inflammatory effects and reduces pain and stiffness.  This can be found as capsules or extract (more likely to be free of contaminants). For OA: Capsule, typically 400 mg to 600 mg, three times per day; or 0.5 g to 1 g of powdered root up to 3 g per day. For RA: 500 mg twice daily.  It's best absorbed if it contains black pepper.  Tart cherry juice is a natural anti-inflammatory agent.  We sometimes hear from people who would like to know where to find cherries out of season. Some report that their local market does not carry cherry juice. There are a number of reputable online vendors who could supply cherry concentrate so you can use cherry juice to see if it helps your arthritic joints. Studies have been done with powdered [place] cherries, CherryPURE. The usual dose is 480 mg/day.  But when it comes to supplements, you have to be careful. High doses of B6 can damage the nerves.   Some studies suggest that supplements such as ginkgo biloba and ginseng can thin the blood and increase the risk of bleeding. This could lead to serious consequences for anyone getting surgery for chronic pain.  Finally, supplements don't always contain what they claim as supplement manufacturers are not regulated by the FDA.  I get very confused with the thousands of supplements available and which to choose.  Brands to consider: [employer], Nature[employer] Way, [employer], Garden of Life, [employer], [employer] and [employer]   I usually go on Amazon and look for the highly rated products.  [employer] or [employer] are great resources and have done the research   for you.     Cognitive Behavioral Therapy. Some people with chronic pain balk at the idea of seeing a therapist -- they think it implies that their pain isn't real. But studies show that depression and chronic pain often go together. Chronic pain can cause or worsen depression; depression can lower a person's tolerance for  pain.  Stress-reduction techniques. There are number of approaches, including: Yoga. There's good evidence that yoga can help with chronic pain,  specifically fibromyalgia, neck pain, back pain, and arthritis. Relaxation therapy. This is actually a category of techniques that help people calm the body and release tension -- a process that might also reduce pain. Some approaches teach people how to focus on their breathing. Research shows that relaxation therapy can help with fibromyalgia, headache, osteoarthritis, and other conditions. Hypnosis. Studies have found this approach helpful with different sorts of pain, like back pain, repetitive strain injuries, and cancer pain. Guided imagery. Research shows that guided imagery can help with conditions like headache pain, cancer pain, osteoarthritis, and fibromyalgia. How does it work? An expert would teach you ways to direct your thoughts by focusing on specific images. Music therapy. This approach gets people to either perform or listen to music. Studies have found that it can help with many different pain conditions, like osteoarthritis and cancer pain. Biofeedback. This approach teaches you how to control normally unconscious bodily functions, like blood pressure or your heart rate. Studies have found that it can help with headaches, fibromyalgia, and other conditions. Massage. It's undeniably relaxing. And there's some evidence that massage can help ease pain from rheumatoid arthritis, neck and back injuries, and fibromyalgia.  Risky Alternative Pain Treatments Experts say you should keep your expectations for alternative pain treatments modest -- especially when it comes to "miracle cures." Controlling chronic pain is not simple. A single supplement, device, or treatment is not going to make your chronic pain disappear. You a need to be suspicious of anyone pushing a treatment when the financial motive is blatant. That doesn't only apply to pain  treatments advertised on dubious web sites asking for your credit card number.  Experts say that you should try to keep up to date with research into alternative treatments for chronic pain. The options for people with chronic pain are always growing -- and some of the odder treatments of today might become the mainstream treatments of tomorrow.   

## 2015-12-11 NOTE — Progress Notes (Signed)
BP 129/74 mmHg  Pulse 59  Temp(Src) 97.4 F (36.3 C)  Ht 5' 2.7" (1.593 m)  Wt 183 lb 12.8 oz (83.371 kg)  BMI 32.85 kg/m2  SpO2 94%  LMP  (LMP Unknown)   Subjective:    Patient ID: Cynthia Hurst, female    DOB: 1935/07/14, 80 y.o.   MRN: [id number]  HPI: Cynthia Hurst is a 80 y.o. female  Chief Complaint  Patient presents with  . Hypertension  . Chronic Kidney Disease  . Pain    pt states she has been having pain in her joints, mainly in hands and legs  . Medication Refill    pt states she needs refills on allopurinol, atenolol, and raloxifene   Hypertension Using medications without difficulty Average home BPs: "pretty good"   No problems or lightheadedness No chest pain with exertion or shortness of breath No Edema C/o cramps in legs and fingers.  This happens various times during the day but not everyday.  She does mustard at night.    GERD Well controlled but insurance not covering Lansoprazole and needs an alternative.    Physical done 6 months ago  Relevant past medical, surgical, family and social history reviewed and updated as indicated. Interim medical history since our last visit reviewed. Allergies and medications reviewed and updated.  Review of Systems  Per HPI unless specifically indicated above     Objective:    BP 129/74 mmHg  Pulse 59  Temp(Src) 97.4 F (36.3 C)  Ht 5' 2.7" (1.593 m)  Wt 183 lb 12.8 oz (83.371 kg)  BMI 32.85 kg/m2  SpO2 94%  LMP  (LMP Unknown)  Wt Readings from Last 3 Encounters:  12/11/15 183 lb 12.8 oz (83.371 kg)  06/10/15 188 lb 9.6 oz (85.548 kg)  12/02/14 185 lb (83.915 kg)    Physical Exam  Constitutional: She is oriented to person, place, and time. She appears well-developed and well-nourished. No distress.  HENT:  Head: Normocephalic and atraumatic.  Eyes: Conjunctivae and lids are normal. Right eye exhibits no discharge. Left eye exhibits no discharge. No scleral icterus.  Neck: Normal range of  motion. Neck supple. No JVD present. Carotid bruit is not present.  Cardiovascular: Normal rate, regular rhythm and normal heart sounds.   Pulmonary/Chest: Effort normal and breath sounds normal.  Abdominal: Normal appearance. There is no splenomegaly or hepatomegaly.  Musculoskeletal: Normal range of motion.  Neurological: She is alert and oriented to person, place, and time.  Skin: Skin is warm, dry and intact. No rash noted. No pallor.  Psychiatric: She has a normal mood and affect. Her behavior is normal. Judgment and thought content normal.      Assessment & Plan:   Problem List Items Addressed This Visit      Unprioritized   Gout - Primary    Check uric acid      CKD (chronic kidney disease), stage III    Check CMP      Relevant Orders   Comprehensive metabolic panel   Muscle cramps    OTC treatments discussed      Benign hypertension with chronic kidney disease    Stable, continue present medications.        Relevant Medications   atenolol (TENORMIN) 100 MG tablet   NIFEdipine (PROCARDIA-XL/ADALAT CC) 60 MG 24 hr tablet   Other Relevant Orders   Lipid Panel Piccolo, Waived   Microalbumin, Urine Waived    Other Visit Diagnoses    Hyperlipidemia  Relevant Medications    atenolol (TENORMIN) 100 MG tablet    NIFEdipine (PROCARDIA-XL/ADALAT CC) 60 MG 24 hr tablet    Other Relevant Orders    Microalbumin, Urine Waived        Follow up plan: Return in about 6 months (around 06/10/2016) for physical.

## 2015-12-11 NOTE — Assessment & Plan Note (Signed)
Check CMP.  ?

## 2015-12-12 LAB — COMPREHENSIVE METABOLIC PANEL
A/G RATIO: 1.6 (ref 1.1–2.5)
ALT: 12 IU/L (ref 0–32)
AST: 24 IU/L (ref 0–40)
Albumin: 4.1 g/dL (ref 3.5–4.8)
Alkaline Phosphatase: 105 IU/L (ref 39–117)
BILIRUBIN TOTAL: 0.6 mg/dL (ref 0.0–1.2)
BUN/Creatinine Ratio: 15 (ref 11–26)
BUN: 15 mg/dL (ref 8–27)
CALCIUM: 9.6 mg/dL (ref 8.7–10.3)
CO2: 25 mmol/L (ref 18–29)
Chloride: 104 mmol/L (ref 96–106)
Creatinine, Ser: 0.97 mg/dL (ref 0.57–1.00)
GFR, EST AFRICAN AMERICAN: 65 mL/min/{1.73_m2} (ref >59)
GFR, EST NON AFRICAN AMERICAN: 56 mL/min/{1.73_m2} — AB (ref >59)
GLOBULIN, TOTAL: 2.6 g/dL (ref 1.5–4.5)
Glucose: 92 mg/dL (ref 65–99)
POTASSIUM: 4.2 mmol/L (ref 3.5–5.2)
SODIUM: 143 mmol/L (ref 134–144)
TOTAL PROTEIN: 6.7 g/dL (ref 6.0–8.5)

## 2015-12-16 ENCOUNTER — Telehealth: Payer: Self-pay | Admitting: Unknown Physician Specialty

## 2015-12-16 NOTE — Telephone Encounter (Signed)
Both of these medications [facility]11[facility]15, but they had the medications on hold. [facility] [address] and explained to them that they were sent to the mail order instead of the local pharmacy. They stated they could still see the prescriptions in the system since [facility] [address] [facility] so I called the patient and let her know that she could pick them up soon.

## 2015-12-16 NOTE — Telephone Encounter (Signed)
Pt called stated she needs a refill on:  Nifedipine Raloxefine  Pharm [facility] [address]. Pt is out of these medications.   Thanks.

## 2015-12-28 ENCOUNTER — Other Ambulatory Visit: Payer: Self-pay | Admitting: Unknown Physician Specialty

## 2015-12-29 ENCOUNTER — Telehealth: Payer: Self-pay | Admitting: Unknown Physician Specialty

## 2015-12-29 NOTE — Telephone Encounter (Signed)
Pt has a question about her medication that was ordered from her home delivery pharmacy.

## 2015-12-29 NOTE — Telephone Encounter (Signed)
Called and spoke to patient about medications. She wanted to know if Malachy Mood could send 2 of her prescriptions in so she would know how much she would have to pay for them. I told the patient that both medications she asked about were sent in on 12/11/15 for a year supply on both medications. Patient stated she was going to call them back and try to find out what was going on. I asked for her to please give [place] a call if she has any issues.

## 2016-01-26 ENCOUNTER — Telehealth: Payer: Self-pay | Admitting: Unknown Physician Specialty

## 2016-01-26 NOTE — Telephone Encounter (Signed)
Called pharmacy because patient should not have been out of medications. The pharmacy said that they put the prescriptions on hold that were sent in on 12/11/15 because the patient just got a 3 month supply filled in November so insurance would not pay for the prescriptions sent in 12/11/15. They said that they are going to get the prescriptions they had on hold ready because she should be able to get them now.

## 2016-01-26 NOTE — Telephone Encounter (Signed)
Helene Kelp: This is not my patient

## 2016-01-26 NOTE — Telephone Encounter (Signed)
Called and left patient a voicemail letting her know that I talked to the pharmacy and that they are getting her prescriptions ready to send out to her today. I asked for her to give [place] a call back if we can do anything else for her.

## 2016-01-26 NOTE — Telephone Encounter (Signed)
atenolol (TENORMIN) 100 MG tablet allopurinol (ZYLOPRIM) 300 MG tablet  Patient called need refill on her medications above sent to mail order, please call patient when this is done, thanks.

## 2016-03-17 ENCOUNTER — Other Ambulatory Visit: Payer: Self-pay | Admitting: Unknown Physician Specialty

## 2016-03-17 NOTE — Telephone Encounter (Signed)
Your patient 

## 2016-03-28 ENCOUNTER — Telehealth: Payer: Self-pay

## 2016-03-28 NOTE — Telephone Encounter (Signed)
Called and let patient know what Malachy Mood suggested. I asked for the patient to please call us after she has been taking the magnesium for a while if it is not helping so we can see what else we could do.

## 2016-03-28 NOTE — Telephone Encounter (Signed)
I usually recommend Magnesium supplements.  400-800/day

## 2016-03-28 NOTE — Telephone Encounter (Signed)
Patient called and left me a voicemail stating that she is having cramps in her fingers and ankles. She states this is very painful and wants to know if there is something that Cynthia Hurst recommends that she can take or if she needs to come in to be seen.

## 2016-05-02 ENCOUNTER — Other Ambulatory Visit: Payer: Self-pay | Admitting: Unknown Physician Specialty

## 2016-06-10 ENCOUNTER — Ambulatory Visit (INDEPENDENT_AMBULATORY_CARE_PROVIDER_SITE_OTHER): Payer: Medicare Other | Admitting: Unknown Physician Specialty

## 2016-06-10 ENCOUNTER — Encounter: Payer: Self-pay | Admitting: Unknown Physician Specialty

## 2016-06-10 VITALS — BP 138/80 | HR 59 | Temp 97.4°F | Ht 63.0 in | Wt 183.2 lb

## 2016-06-10 DIAGNOSIS — E538 Deficiency of other specified B group vitamins: Secondary | ICD-10-CM

## 2016-06-10 DIAGNOSIS — R252 Cramp and spasm: Secondary | ICD-10-CM

## 2016-06-10 DIAGNOSIS — Z0000 Encounter for general adult medical examination without abnormal findings: Secondary | ICD-10-CM

## 2016-06-10 DIAGNOSIS — N189 Chronic kidney disease, unspecified: Secondary | ICD-10-CM

## 2016-06-10 DIAGNOSIS — E559 Vitamin D deficiency, unspecified: Secondary | ICD-10-CM

## 2016-06-10 DIAGNOSIS — I129 Hypertensive chronic kidney disease with stage 1 through stage 4 chronic kidney disease, or unspecified chronic kidney disease: Secondary | ICD-10-CM

## 2016-06-10 NOTE — Assessment & Plan Note (Signed)
Check CBC and B12 

## 2016-06-10 NOTE — Patient Instructions (Addendum)
Cramps For prevention: take a magnesium supplements end with the suffix "ate"  Magnesium Citrate, Magenesium Malate.  Magnesium Oxide is not as well absorbed  Try putting a bar of soap underneath your sheets.    If the cramp hits, take a swig of mustard or apple cider vinegar.    Advance Directive Advance directives are the legal documents that allow you to make choices about your health care and medical treatment if you cannot speak for yourself. Advance directives are a way for you to communicate your wishes to family, friends, and health care providers. The specified people can then convey your decisions about end-of-life care to avoid confusion if you should become unable to communicate. Ideally, the process of discussing and writing advance directives should happen over time rather than making decisions all at once. Advance directives can be modified as your situation changes, and you can change your mind at any time, even after you have signed the advance directives. Each state has its own laws regarding advance directives. You may want to check with your health care provider, attorney, or state representative about the law in your state. Below are some examples of advance directives. LIVING WILL A living will is a set of instructions documenting your wishes about medical care when you cannot care for yourself. It is used if you become:  Terminally ill.  Incapacitated.  Unable to communicate.  Unable to make decisions. Items to consider in your living will include:  The use or non-use of life-sustaining equipment, such as dialysis machines and breathing machines (ventilators).  A do not resuscitate (DNR) order, which is the instruction not to use cardiopulmonary resuscitation (CPR) if breathing or heartbeat stops.  Tube feeding.  Withholding of food and fluids.  Comfort (palliative) care when the goal becomes comfort rather than a cure.  Organ and tissue donation. A living  will does not give instructions about distribution of your money and property if you should pass away. It is advisable to seek the expert advice of a lawyer in drawing up a will regarding your possessions. Decisions about taxes, beneficiaries, and asset distribution will be legally binding. This process can relieve your family and friends of any burdens surrounding disputes or questions that may come up about the allocation of your assets. DO NOT RESUSCITATE (DNR) A do not resuscitate (DNR) order is a request to not have CPR in the event that your heart stops beating or you stop breathing. Unless given other instructions, a health care provider will try to help any patient whose heart has stopped or who has stopped breathing.  HEALTH CARE PROXY AND DURABLE POWER OF ATTORNEY FOR HEALTH CARE A health care proxy is a person (agent) appointed to make medical decisions for you if you cannot. Generally, people choose someone they know well and trust to represent their preferences when they can no longer do so. You should be sure to ask this person for agreement to act as your agent. An agent may have to exercise judgment in the event of a medical decision for which your wishes are not known. The durable power of attorney for health care is the legal document that names your health care proxy. Once written, it should be:  Signed.  Notarized.  Dated.  Copied.  Witnessed.  Incorporated into your medical record. You may also want to appoint someone to manage your financial affairs if you cannot. This is called a durable power of attorney for finances. It is a separate legal document  from the durable power of attorney for health care. You may choose the same person or someone different from your health care proxy to act as your agent in financial matters.   This information is not intended to replace advice given to you by your health care provider. Make sure you discuss any questions you have with your  health care provider.   Document Released: 03/06/2008 Document Revised: 12/03/2013 Document Reviewed: 04/17/2013 Elsevier Interactive Patient Education [employer].

## 2016-06-10 NOTE — Assessment & Plan Note (Signed)
Stable, continue present medications.  Check CMP

## 2016-06-10 NOTE — Assessment & Plan Note (Signed)
Taking Vit D supplements

## 2016-06-10 NOTE — Progress Notes (Signed)
BP 138/80 mmHg  Pulse 59  Temp(Src) 97.4 F (36.3 C)  Ht 5\' 3"  (1.6 m)  Wt 183 lb 3.2 oz (83.099 kg)  BMI 32.46 kg/m2  SpO2 95%  LMP  (LMP Unknown)   Subjective:    Patient ID: Cynthia Hurst, female    DOB: 12/24/1934, 81 y.o.   MRN: [id number]  HPI: Cynthia Hurst is a 81 y.o. female  Chief Complaint  Patient presents with  . Medicare Wellness   Depression screen [facility] 2/9 06/10/2016 06/10/2015  Decreased Interest 0 0  Down, Depressed, Hopeless 0 0  PHQ - 2 Score 0 0    Fall Risk  06/10/2016 06/10/2015  Falls in the past year? No No   Hypertension Using medications without difficulty Average home BPs   No problems or lightheadedness No chest pain with exertion or shortness of breath No Edema   GERD Stable  Cramps States she suffers with hand and leg cramps.  This comes and goes.    Mini cog with some problems recalling words.    Social History   Social History  . Marital Status: Married    Spouse Name: N/A  . Number of Children: N/A  . Years of Education: N/A   Occupational History  . Not on file.   Social History Main Topics  . Smoking status: Never Smoker   . Smokeless tobacco: Never Used  . Alcohol Use: No  . Drug Use: No  . Sexual Activity: No   Other Topics Concern  . Not on file   Social History Narrative   Family History  Problem Relation Age of Onset  . Heart disease Mother   . Cancer Father   . Mental illness Father     Family History  Problem Relation Age of Onset  . Heart disease Mother   . Cancer Father   . Mental illness Father    Past Surgical History  Procedure Laterality Date  . Joint replacement  [id number]    KNEE  . Abdominal hysterectomy      PARTIAL     Relevant past medical, surgical, family and social history reviewed and updated as indicated. Interim medical history since our last visit reviewed. Allergies and medications reviewed and updated.  Review of Systems  Per HPI unless specifically  indicated above     Objective:    BP 138/80 mmHg  Pulse 59  Temp(Src) 97.4 F (36.3 C)  Ht 5\' 3"  (1.6 m)  Wt 183 lb 3.2 oz (83.099 kg)  BMI 32.46 kg/m2  SpO2 95%  LMP  (LMP Unknown)  Wt Readings from Last 3 Encounters:  06/10/16 183 lb 3.2 oz (83.099 kg)  12/11/15 183 lb 12.8 oz (83.371 kg)  06/10/15 188 lb 9.6 oz (85.548 kg)    Physical Exam  Constitutional: She is oriented to person, place, and time. She appears well-developed and well-nourished. No distress.  HENT:  Head: Normocephalic and atraumatic.  Eyes: Conjunctivae and lids are normal. Right eye exhibits no discharge. Left eye exhibits no discharge. No scleral icterus.  Neck: Normal range of motion. Neck supple. No JVD present. Carotid bruit is not present.  Cardiovascular: Normal rate, regular rhythm and normal heart sounds.   Pulmonary/Chest: Effort normal and breath sounds normal.  Abdominal: Normal appearance. There is no splenomegaly or hepatomegaly.  Musculoskeletal: Normal range of motion.  Neurological: She is alert and oriented to person, place, and time.  Skin: Skin is warm, dry and intact. No rash noted. No pallor.  Psychiatric: She has a normal mood and affect. Her behavior is normal. Judgment and thought content normal.    Results for orders placed or performed in visit on 12/11/15  Comprehensive metabolic panel  Result Value Ref Range   Glucose 92 65 - 99 mg/dL   BUN 15 8 - 27 mg/dL   Creatinine, Ser 0.97 0.57 - 1.00 mg/dL   GFR calc non Af Amer 56 (L) >59 mL/min/1.73   GFR calc Af Amer 65 >59 mL/min/1.73   BUN/Creatinine Ratio 15 11 - 26   Sodium 143 134 - 144 mmol/L   Potassium 4.2 3.5 - 5.2 mmol/L   Chloride 104 96 - 106 mmol/L   CO2 25 18 - 29 mmol/L   Calcium 9.6 8.7 - 10.3 mg/dL   Total Protein 6.7 6.0 - 8.5 g/dL   Albumin 4.1 3.5 - 4.8 g/dL   Globulin, Total 2.6 1.5 - 4.5 g/dL   Albumin/Globulin Ratio 1.6 1.1 - 2.5   Bilirubin Total 0.6 0.0 - 1.2 mg/dL   Alkaline Phosphatase 105 39 -  117 IU/L   AST 24 0 - 40 IU/L   ALT 12 0 - 32 IU/L  Lipid Panel Piccolo, Waived  Result Value Ref Range   Cholesterol Piccolo, Waived 170 <200 mg/dL   HDL Chol Piccolo, Waived 58 >59 mg/dL   Triglycerides Piccolo,Waived 179 (H) <150 mg/dL   Chol/HDL Ratio Piccolo,Waive 2.9 mg/dL   LDL Chol Calc Piccolo Waived 76 <100 mg/dL   VLDL Chol Calc Piccolo,Waive 36 (H) <30 mg/dL  Microalbumin, Urine Waived  Result Value Ref Range   Microalb, Ur Waived 30 (H) 0 - 19 mg/L   Creatinine, Urine Waived 300 10 - 300 mg/dL   Microalb/Creat Ratio <30 <30 mg/g      Assessment & Plan:   Problem List Items Addressed This Visit      Unprioritized   Benign hypertension with chronic kidney disease    Stable, continue present medications.  Check CMP      Relevant Orders   Comprehensive metabolic panel   Lipid Panel w/o Chol/HDL Ratio   Muscle cramps    Pt education      Relevant Orders   TSH   Vitamin B12 deficiency    Check CBC and B12      Relevant Orders   CBC with Differential/Platelet   Vitamin D deficiency    Taking Vit D supplements       Other Visit Diagnoses    Annual physical exam    -  Primary        Follow up plan: Return in about 6 months (around 12/10/2016).

## 2016-06-10 NOTE — Assessment & Plan Note (Signed)
Pt education

## 2016-06-11 LAB — CBC WITH DIFFERENTIAL/PLATELET
Basophils Absolute: 0 10*3/uL (ref 0.0–0.2)
Basos: 0 %
EOS (ABSOLUTE): 0.1 10*3/uL (ref 0.0–0.4)
Eos: 2 %
HEMATOCRIT: 38.1 % (ref 34.0–46.6)
HEMOGLOBIN: 12.4 g/dL (ref 11.1–15.9)
IMMATURE GRANS (ABS): 0 10*3/uL (ref 0.0–0.1)
Immature Granulocytes: 0 %
LYMPHS ABS: 1.8 10*3/uL (ref 0.7–3.1)
Lymphs: 33 %
MCH: 30.6 pg (ref 26.6–33.0)
MCHC: 32.5 g/dL (ref 31.5–35.7)
MCV: 94 fL (ref 79–97)
MONOCYTES: 7 %
Monocytes Absolute: 0.4 10*3/uL (ref 0.1–0.9)
NEUTROS ABS: 3.2 10*3/uL (ref 1.4–7.0)
Neutrophils: 58 %
Platelets: 158 10*3/uL (ref 150–379)
RBC: 4.05 x10E6/uL (ref 3.77–5.28)
RDW: 15.4 % (ref 12.3–15.4)
WBC: 5.5 10*3/uL (ref 3.4–10.8)

## 2016-06-11 LAB — COMPREHENSIVE METABOLIC PANEL
ALBUMIN: 4.1 g/dL (ref 3.5–4.8)
ALT: 11 IU/L (ref 0–32)
AST: 18 IU/L (ref 0–40)
Albumin/Globulin Ratio: 1.4 (ref 1.2–2.2)
Alkaline Phosphatase: 100 IU/L (ref 39–117)
BILIRUBIN TOTAL: 0.7 mg/dL (ref 0.0–1.2)
BUN / CREAT RATIO: 14 (ref 12–28)
BUN: 14 mg/dL (ref 8–27)
CHLORIDE: 102 mmol/L (ref 96–106)
CO2: 25 mmol/L (ref 18–29)
Calcium: 9.8 mg/dL (ref 8.7–10.3)
Creatinine, Ser: 1 mg/dL (ref 0.57–1.00)
GFR calc non Af Amer: 54 mL/min/{1.73_m2} — ABNORMAL LOW (ref >59)
GFR, EST AFRICAN AMERICAN: 62 mL/min/{1.73_m2} (ref >59)
GLOBULIN, TOTAL: 2.9 g/dL (ref 1.5–4.5)
GLUCOSE: 94 mg/dL (ref 65–99)
Potassium: 4.3 mmol/L (ref 3.5–5.2)
SODIUM: 142 mmol/L (ref 134–144)
TOTAL PROTEIN: 7 g/dL (ref 6.0–8.5)

## 2016-06-11 LAB — LIPID PANEL W/O CHOL/HDL RATIO
Cholesterol, Total: 166 mg/dL (ref 100–199)
HDL: 59 mg/dL (ref >39)
LDL CALC: 82 mg/dL (ref 0–99)
Triglycerides: 127 mg/dL (ref 0–149)
VLDL CHOLESTEROL CAL: 25 mg/dL (ref 5–40)

## 2016-06-11 LAB — TSH: TSH: 3.7 u[IU]/mL (ref 0.450–4.500)

## 2016-06-13 ENCOUNTER — Encounter: Payer: Self-pay | Admitting: Unknown Physician Specialty

## 2016-06-21 ENCOUNTER — Other Ambulatory Visit: Payer: Self-pay | Admitting: Unknown Physician Specialty

## 2016-07-29 ENCOUNTER — Other Ambulatory Visit: Payer: Self-pay | Admitting: Unknown Physician Specialty

## 2016-09-19 ENCOUNTER — Ambulatory Visit (INDEPENDENT_AMBULATORY_CARE_PROVIDER_SITE_OTHER): Payer: Medicare Other | Admitting: Unknown Physician Specialty

## 2016-09-19 ENCOUNTER — Encounter: Payer: Self-pay | Admitting: Unknown Physician Specialty

## 2016-09-19 ENCOUNTER — Other Ambulatory Visit: Payer: Self-pay | Admitting: Unknown Physician Specialty

## 2016-09-19 VITALS — BP 107/61 | HR 66 | Temp 97.7°F | Ht 63.3 in | Wt 184.6 lb

## 2016-09-19 DIAGNOSIS — J3089 Other allergic rhinitis: Secondary | ICD-10-CM | POA: Diagnosis not present

## 2016-09-19 DIAGNOSIS — M7989 Other specified soft tissue disorders: Secondary | ICD-10-CM

## 2016-09-19 DIAGNOSIS — J309 Allergic rhinitis, unspecified: Secondary | ICD-10-CM | POA: Insufficient documentation

## 2016-09-19 MED ORDER — NIFEDIPINE ER 60 MG PO TB24: 60.0000 mg | ORAL_TABLET | Freq: Every day | ORAL | 1 refills | Status: DC

## 2016-09-19 NOTE — Patient Instructions (Signed)
Try OTC Clariton or Allegra

## 2016-09-19 NOTE — Assessment & Plan Note (Signed)
Recommended Clariton

## 2016-09-19 NOTE — Progress Notes (Signed)
BP 107/61 (BP Location: Left Arm, Patient Position: Sitting, Cuff Size: Large)   Pulse 66   Temp 97.7 F (36.5 C)   Ht 5' 3.3" (1.608 m)   Wt 184 lb 9.6 oz (83.7 kg)   LMP  (LMP Unknown)   SpO2 91%   BMI 32.39 kg/m    Subjective:    Patient ID: Cynthia Hurst, female    DOB: 1935-08-11, 81 y.o.   MRN: [id number]  HPI: Cynthia Hurst is a 81 y.o. female  Chief Complaint  Patient presents with  . Foot Swelling    pt states she has been having bilateral foot swelling for about a month  . scratchy throat    pt states her throat has been scratchy for while now   . Medication Refill    pt states she needs refill on raloxifene and nifedipine   Hypertension Using medications without difficulty   No problems or lightheadedness No chest pain with exertion or shortness of breath Noting feet swelling and wonders why.    Scratchy throat Congestion in throat but not sick.    Relevant past medical, surgical, family and social history reviewed and updated as indicated. Interim medical history since our last visit reviewed. Allergies and medications reviewed and updated.  Review of Systems  Per HPI unless specifically indicated above     Objective:    BP 107/61 (BP Location: Left Arm, Patient Position: Sitting, Cuff Size: Large)   Pulse 66   Temp 97.7 F (36.5 C)   Ht 5' 3.3" (1.608 m)   Wt 184 lb 9.6 oz (83.7 kg)   LMP  (LMP Unknown)   SpO2 91%   BMI 32.39 kg/m   Wt Readings from Last 3 Encounters:  09/19/16 184 lb 9.6 oz (83.7 kg)  06/10/16 183 lb 3.2 oz (83.1 kg)  12/11/15 183 lb 12.8 oz (83.4 kg)    Physical Exam  Constitutional: She is oriented to person, place, and time. She appears well-developed and well-nourished. No distress.  HENT:  Head: Normocephalic and atraumatic.  Eyes: Conjunctivae and lids are normal. Right eye exhibits no discharge. Left eye exhibits no discharge. No scleral icterus.  Neck: Normal range of motion. Neck supple. No JVD present.  Carotid bruit is not present.  Cardiovascular: Normal rate, regular rhythm and normal heart sounds.   Trace edema bilateral feet  Pulmonary/Chest: Effort normal and breath sounds normal.  Abdominal: Normal appearance. There is no splenomegaly or hepatomegaly.  Musculoskeletal: Normal range of motion.  Neurological: She is alert and oriented to person, place, and time.  Skin: Skin is warm, dry and intact. No rash noted. No pallor.  Psychiatric: She has a normal mood and affect. Her behavior is normal. Judgment and thought content normal.    Results for orders placed or performed in visit on 06/10/16  CBC with Differential/Platelet  Result Value Ref Range   WBC 5.5 3.4 - 10.8 x10E3/uL   RBC 4.05 3.77 - 5.28 x10E6/uL   Hemoglobin 12.4 11.1 - 15.9 g/dL   Hematocrit 38.1 34.0 - 46.6 %   MCV 94 79 - 97 fL   MCH 30.6 26.6 - 33.0 pg   MCHC 32.5 31.5 - 35.7 g/dL   RDW 15.4 12.3 - 15.4 %   Platelets 158 150 - 379 x10E3/uL   Neutrophils 58 %   Lymphs 33 %   Monocytes 7 %   Eos 2 %   Basos 0 %   Neutrophils Absolute 3.2 1.4 - 7.0  x10E3/uL   Lymphocytes Absolute 1.8 0.7 - 3.1 x10E3/uL   Monocytes Absolute 0.4 0.1 - 0.9 x10E3/uL   EOS (ABSOLUTE) 0.1 0.0 - 0.4 x10E3/uL   Basophils Absolute 0.0 0.0 - 0.2 x10E3/uL   Immature Granulocytes 0 %   Immature Grans (Abs) 0.0 0.0 - 0.1 x10E3/uL  Comprehensive metabolic panel  Result Value Ref Range   Glucose 94 65 - 99 mg/dL   BUN 14 8 - 27 mg/dL   Creatinine, Ser 1.00 0.57 - 1.00 mg/dL   GFR calc non Af Amer 54 (L) >59 mL/min/1.73   GFR calc Af Amer 62 >59 mL/min/1.73   BUN/Creatinine Ratio 14 12 - 28   Sodium 142 134 - 144 mmol/L   Potassium 4.3 3.5 - 5.2 mmol/L   Chloride 102 96 - 106 mmol/L   CO2 25 18 - 29 mmol/L   Calcium 9.8 8.7 - 10.3 mg/dL   Total Protein 7.0 6.0 - 8.5 g/dL   Albumin 4.1 3.5 - 4.8 g/dL   Globulin, Total 2.9 1.5 - 4.5 g/dL   Albumin/Globulin Ratio 1.4 1.2 - 2.2   Bilirubin Total 0.7 0.0 - 1.2 mg/dL   Alkaline  Phosphatase 100 39 - 117 IU/L   AST 18 0 - 40 IU/L   ALT 11 0 - 32 IU/L  Lipid Panel w/o Chol/HDL Ratio  Result Value Ref Range   Cholesterol, Total 166 100 - 199 mg/dL   Triglycerides 127 0 - 149 mg/dL   HDL 59 >39 mg/dL   VLDL Cholesterol Cal 25 5 - 40 mg/dL   LDL Calculated 82 0 - 99 mg/dL  TSH  Result Value Ref Range   TSH 3.700 0.450 - 4.500 uIU/mL      Assessment & Plan:   Problem List Items Addressed This Visit      Unprioritized   Allergic rhinitis    Recommended Clariton       Other Visit Diagnoses    Bilateral swelling of feet    -  Primary   Reassured a side effect of the CCB she is on for her BP       Follow up plan: No Follow-up on file.

## 2016-09-20 LAB — COMPREHENSIVE METABOLIC PANEL
A/G RATIO: 1.7 (ref 1.2–2.2)
ALT: 10 IU/L (ref 0–32)
AST: 19 IU/L (ref 0–40)
Albumin: 4 g/dL (ref 3.5–4.8)
Alkaline Phosphatase: 107 IU/L (ref 39–117)
BUN/Creatinine Ratio: 16 (ref 12–28)
BUN: 16 mg/dL (ref 8–27)
Bilirubin Total: 0.4 mg/dL (ref 0.0–1.2)
CALCIUM: 9.6 mg/dL (ref 8.7–10.3)
CO2: 26 mmol/L (ref 18–29)
Chloride: 104 mmol/L (ref 96–106)
Creatinine, Ser: 1.01 mg/dL — ABNORMAL HIGH (ref 0.57–1.00)
GFR, EST AFRICAN AMERICAN: 61 mL/min/{1.73_m2} (ref >59)
GFR, EST NON AFRICAN AMERICAN: 53 mL/min/{1.73_m2} — AB (ref >59)
Globulin, Total: 2.4 g/dL (ref 1.5–4.5)
Glucose: 101 mg/dL — ABNORMAL HIGH (ref 65–99)
POTASSIUM: 4.4 mmol/L (ref 3.5–5.2)
Sodium: 142 mmol/L (ref 134–144)
TOTAL PROTEIN: 6.4 g/dL (ref 6.0–8.5)

## 2016-09-28 ENCOUNTER — Other Ambulatory Visit: Payer: Self-pay | Admitting: Unknown Physician Specialty

## 2016-09-28 DIAGNOSIS — Z1231 Encounter for screening mammogram for malignant neoplasm of breast: Secondary | ICD-10-CM

## 2016-09-29 ENCOUNTER — Other Ambulatory Visit: Payer: Self-pay | Admitting: Unknown Physician Specialty

## 2016-09-30 ENCOUNTER — Telehealth: Payer: Self-pay | Admitting: Unknown Physician Specialty

## 2016-09-30 NOTE — Telephone Encounter (Signed)
Please let her know her results are similar to her previous results.  Just mild decrease with kidney function.  Stable is good.

## 2016-09-30 NOTE — Telephone Encounter (Signed)
Your patient 

## 2016-09-30 NOTE — Telephone Encounter (Signed)
Patient notified

## 2016-09-30 NOTE — Telephone Encounter (Signed)
Pt would like a call back regarding her lab results

## 2016-09-30 NOTE — Telephone Encounter (Signed)
Routing to provider  

## 2016-10-04 ENCOUNTER — Other Ambulatory Visit: Payer: Self-pay | Admitting: Unknown Physician Specialty

## 2016-10-04 NOTE — Telephone Encounter (Signed)
Routing to provider  

## 2016-10-04 NOTE — Telephone Encounter (Signed)
Pt called needs refill Raloxefine. Pharm is [facility]. Pt stated pharmacy told her the RX was discontinued. Pt stated nobody informed her of this. Please call pt ASAP. Thanks.

## 2016-10-05 MED ORDER — RALOXIFENE HCL 60 MG PO TABS: 60.0000 mg | ORAL_TABLET | Freq: Every day | ORAL | 3 refills | Status: DC

## 2016-10-05 NOTE — Telephone Encounter (Signed)
Called and let patient know rx was sent in.  

## 2016-10-05 NOTE — Telephone Encounter (Signed)
This is not discontinued and I filled it

## 2016-11-01 ENCOUNTER — Ambulatory Visit
Admission: RE | Admit: 2016-11-01 | Discharge: 2016-11-01 | Disposition: A | Discharge status: Home / Self Care | Payer: Medicare Other | Source: Ambulatory Visit | Attending: Unknown Physician Specialty | Admitting: Unknown Physician Specialty

## 2016-11-01 DIAGNOSIS — Z1231 Encounter for screening mammogram for malignant neoplasm of breast: Secondary | ICD-10-CM | POA: Diagnosis not present

## 2016-11-02 ENCOUNTER — Other Ambulatory Visit: Payer: Self-pay | Admitting: Unknown Physician Specialty

## 2016-11-02 DIAGNOSIS — N6489 Other specified disorders of breast: Secondary | ICD-10-CM

## 2016-11-11 ENCOUNTER — Ambulatory Visit
Admission: RE | Admit: 2016-11-11 | Discharge: 2016-11-11 | Disposition: A | Discharge status: Home / Self Care | Payer: Medicare Other | Source: Ambulatory Visit | Attending: Unknown Physician Specialty | Admitting: Unknown Physician Specialty

## 2016-11-11 DIAGNOSIS — R928 Other abnormal and inconclusive findings on diagnostic imaging of breast: Secondary | ICD-10-CM | POA: Diagnosis not present

## 2016-11-11 DIAGNOSIS — N6489 Other specified disorders of breast: Secondary | ICD-10-CM

## 2016-11-11 DIAGNOSIS — N6001 Solitary cyst of right breast: Secondary | ICD-10-CM | POA: Diagnosis not present

## 2016-11-24 ENCOUNTER — Telehealth: Payer: Self-pay | Admitting: Unknown Physician Specialty

## 2016-11-24 NOTE — Telephone Encounter (Signed)
Patient returned your call.    Thanks Cynthia Hurst

## 2016-11-24 NOTE — Telephone Encounter (Signed)
Brit: I think this is your patient

## 2016-11-24 NOTE — Telephone Encounter (Signed)
Called and left patient a VM asking for her to please return my call to discuss form she dropped off recently.

## 2016-11-24 NOTE — Telephone Encounter (Signed)
Patient returned your call.  She said she has to leave her house around 830 but if you do not reach her she may stop by here to speak with you.  Thank you Santiago Glad

## 2016-11-24 NOTE — Telephone Encounter (Signed)
Called and spoke to patient. I explained to her that she has to set up her account with the mail order pharmacy and then we will start sending her medications to them at the beginning of the year. Patient stated she would come by and pick up form if she cannot find her copy at home.

## 2016-11-28 ENCOUNTER — Other Ambulatory Visit: Payer: Self-pay

## 2016-11-28 MED ORDER — POTASSIUM CHLORIDE CRYS ER 20 MEQ PO TBCR: 20.0000 meq | EXTENDED_RELEASE_TABLET | Freq: Every day | ORAL | 3 refills | Status: DC

## 2016-11-28 MED ORDER — RALOXIFENE HCL 60 MG PO TABS: 60.0000 mg | ORAL_TABLET | Freq: Every day | ORAL | 3 refills | Status: DC

## 2016-11-28 MED ORDER — OMEPRAZOLE 20 MG PO CPDR
20.0000 mg | DELAYED_RELEASE_CAPSULE | Freq: Every day | ORAL | 3 refills | Status: DC
Start: 2016-11-28 — End: 2016-12-16

## 2016-11-28 MED ORDER — NIFEDIPINE ER 60 MG PO TB24: 60.0000 mg | ORAL_TABLET | Freq: Every day | ORAL | 2 refills | Status: DC

## 2016-11-28 MED ORDER — ATENOLOL 100 MG PO TABS: 100.0000 mg | ORAL_TABLET | Freq: Every day | ORAL | 3 refills | Status: DC

## 2016-11-28 MED ORDER — ALLOPURINOL 300 MG PO TABS: 300.0000 mg | ORAL_TABLET | Freq: Every day | ORAL | 1 refills | Status: DC

## 2016-11-28 NOTE — Telephone Encounter (Signed)
Patient is switching pharmacies per [facility]. Please send new prescriptions to new pharmacy, [facility].

## 2016-12-06 ENCOUNTER — Other Ambulatory Visit: Payer: Self-pay

## 2016-12-06 ENCOUNTER — Encounter: Payer: Self-pay | Admitting: Family Medicine

## 2016-12-06 ENCOUNTER — Ambulatory Visit (INDEPENDENT_AMBULATORY_CARE_PROVIDER_SITE_OTHER): Payer: Medicare Other | Admitting: Family Medicine

## 2016-12-06 VITALS — BP 146/72 | HR 69 | Temp 97.7°F | Wt 181.0 lb

## 2016-12-06 DIAGNOSIS — M159 Polyosteoarthritis, unspecified: Secondary | ICD-10-CM

## 2016-12-06 DIAGNOSIS — M549 Dorsalgia, unspecified: Secondary | ICD-10-CM | POA: Diagnosis not present

## 2016-12-06 DIAGNOSIS — M545 Low back pain: Secondary | ICD-10-CM | POA: Diagnosis not present

## 2016-12-06 LAB — MICROSCOPIC EXAMINATION
BACTERIA UA: NONE SEEN
Epithelial Cells (non renal): NONE SEEN /hpf (ref 0–10)
WBC UA: NONE SEEN /HPF (ref 0–?)

## 2016-12-06 LAB — UA/M W/RFLX CULTURE, ROUTINE
BILIRUBIN UA: NEGATIVE
Glucose, UA: NEGATIVE
KETONES UA: NEGATIVE
Leukocytes, UA: NEGATIVE
Nitrite, UA: NEGATIVE
PH UA: 5.5 (ref 5.0–7.5)
PROTEIN UA: NEGATIVE
Specific Gravity, UA: 1.025 (ref 1.005–1.030)
UUROB: 0.2 mg/dL (ref 0.2–1.0)

## 2016-12-06 MED ORDER — TAMSULOSIN HCL 0.4 MG PO CAPS: 0.4000 mg | ORAL_CAPSULE | Freq: Every day | ORAL | 0 refills | Status: DC

## 2016-12-06 MED ORDER — CYCLOBENZAPRINE HCL 10 MG PO TABS: 10.0000 mg | ORAL_TABLET | Freq: Three times a day (TID) | ORAL | 0 refills | Status: DC | PRN

## 2016-12-06 NOTE — Progress Notes (Signed)
BP (!) 146/72   Pulse 69   Temp 97.7 F (36.5 C)   Wt 181 lb (82.1 kg)   LMP  (LMP Unknown)   SpO2 96%   BMI 31.76 kg/m    Subjective:    Patient ID: Cynthia Hurst, female    DOB: [birth year], 81 y.o.   MRN: [id number]  HPI: Cynthia Hurst is a 81 y.o. female  Chief Complaint  Patient presents with  . Back Pain    lower left side x week. No known injury. No urinary symptoms.  Does not radiate. Heating pad eases the pain a little bit.    Patient presents with left sided mid-back pain that does not radiate x about a week. The pain is mostly constant, but sometimes intermittent per patient. Unsure if it is sharp or dull. Movements do not seem to bring it on, no pattern noticed. No known injury or movements that are out of the ordinary. No urinary sxs. Did have some constipation earlier on in the week but had some laxatives and that resolved but the pain did not resolve. Heating pad and massage with icy hot helps for a short time.   Also c/o hand and leg joint pains and stiffness, especially when she rolls over in the night. Does have a hx of arthritis. Not currently taking anything OTC because she states she didn't know what she could take. No redness or swelling in joints. No fever, chills.   Past Medical History:  Diagnosis Date  . Chronic kidney disease   . GERD (gastroesophageal reflux disease)   . Gout   . Hypertension   . Hypopotassemia   . Osteoarthritis   . Osteoporosis    LUMBAR SPINE  . Vitamin B 12 deficiency   . Vitamin D deficiency    Social History   Social History  . Marital status: Married    Spouse name: N/A  . Number of children: N/A  . Years of education: N/A   Occupational History  . Not on file.   Social History Main Topics  . Smoking status: Never Smoker  . Smokeless tobacco: Never Used  . Alcohol use No  . Drug use: No  . Sexual activity: No   Other Topics Concern  . Not on file   Social History Narrative  . No narrative on file     Relevant past medical, surgical, family and social history reviewed and updated as indicated. Interim medical history since our last visit reviewed. Allergies and medications reviewed and updated.  Review of Systems  Constitutional: Negative.   HENT: Negative.   Eyes: Negative.   Respiratory: Negative.   Cardiovascular: Negative.   Gastrointestinal: Negative.   Genitourinary: Negative.   Musculoskeletal: Positive for arthralgias and back pain.  Neurological: Negative.   Psychiatric/Behavioral: Negative.     Per HPI unless specifically indicated above     Objective:    BP (!) 146/72   Pulse 69   Temp 97.7 F (36.5 C)   Wt 181 lb (82.1 kg)   LMP  (LMP Unknown)   SpO2 96%   BMI 31.76 kg/m   Wt Readings from Last 3 Encounters:  12/06/16 181 lb (82.1 kg)  09/19/16 184 lb 9.6 oz (83.7 kg)  06/10/16 183 lb 3.2 oz (83.1 kg)    Physical Exam  Constitutional: She is oriented to person, place, and time. She appears well-developed and well-nourished. No distress.  HENT:  Head: Atraumatic.  Eyes: Conjunctivae are normal. Pupils are equal,  round, and reactive to light. No scleral icterus.  Neck: Normal range of motion. Neck supple.  Cardiovascular: Normal rate and normal heart sounds.   Pulmonary/Chest: Effort normal and breath sounds normal. No respiratory distress.  Abdominal: Soft. Bowel sounds are normal. There is no tenderness.  Musculoskeletal: Normal range of motion. She exhibits no edema, tenderness or deformity.  Non-tender to palpation in left mid back ROM intact without pain No CVA tenderness b/l Non-tender over spine  Grip strength full and equal b/l hands  Neurological: She is alert and oriented to person, place, and time.  Skin: Skin is warm and dry.  Psychiatric: She has a normal mood and affect. Her behavior is normal.  Nursing note and vitals reviewed.     Assessment & Plan:   Problem List Items Addressed This Visit    None    Visit Diagnoses     Mid back pain on left side    -  Primary   U/A with 2+ RBCs, but non-specific sxs. Will treat with flomax and flexeril to cover muscle strain or possible urinary stone. Aleve, heat pad, massage   Relevant Medications   cyclobenzaprine (FLEXERIL) 10 MG tablet   Other Relevant Orders   UA/M w/rflx Culture, Routine (STAT)   Osteoarthritis of multiple joints, unspecified osteoarthritis type       Suspect weather-related flare. Can take tylenol, aleve, or alternate the two. Keep joints mobile to avoid locking up/stiffness.    Relevant Medications   cyclobenzaprine (FLEXERIL) 10 MG tablet       Follow up plan: Return if symptoms worsen or fail to improve.

## 2016-12-06 NOTE — Patient Instructions (Signed)
Can alternate aleve (or advil or ibuprofen) with tylenol

## 2016-12-16 ENCOUNTER — Ambulatory Visit
Admission: RE | Admit: 2016-12-16 | Discharge: 2016-12-16 | Disposition: A | Discharge status: Home / Self Care | Payer: Medicare Other | Source: Ambulatory Visit | Attending: Unknown Physician Specialty | Admitting: Unknown Physician Specialty

## 2016-12-16 ENCOUNTER — Encounter: Payer: Self-pay | Admitting: Unknown Physician Specialty

## 2016-12-16 ENCOUNTER — Ambulatory Visit (INDEPENDENT_AMBULATORY_CARE_PROVIDER_SITE_OTHER): Payer: Medicare Other | Admitting: Unknown Physician Specialty

## 2016-12-16 ENCOUNTER — Other Ambulatory Visit: Payer: Self-pay | Admitting: Unknown Physician Specialty

## 2016-12-16 VITALS — BP 127/73 | HR 85 | Temp 97.9°F | Ht 63.1 in | Wt 182.2 lb

## 2016-12-16 DIAGNOSIS — M545 Low back pain, unspecified: Secondary | ICD-10-CM

## 2016-12-16 DIAGNOSIS — M109 Gout, unspecified: Secondary | ICD-10-CM | POA: Diagnosis not present

## 2016-12-16 DIAGNOSIS — M546 Pain in thoracic spine: Secondary | ICD-10-CM | POA: Insufficient documentation

## 2016-12-16 DIAGNOSIS — M4184 Other forms of scoliosis, thoracic region: Secondary | ICD-10-CM | POA: Diagnosis not present

## 2016-12-16 DIAGNOSIS — M2578 Osteophyte, vertebrae: Secondary | ICD-10-CM | POA: Diagnosis not present

## 2016-12-16 DIAGNOSIS — I700 Atherosclerosis of aorta: Secondary | ICD-10-CM | POA: Insufficient documentation

## 2016-12-16 DIAGNOSIS — K219 Gastro-esophageal reflux disease without esophagitis: Secondary | ICD-10-CM | POA: Insufficient documentation

## 2016-12-16 DIAGNOSIS — I129 Hypertensive chronic kidney disease with stage 1 through stage 4 chronic kidney disease, or unspecified chronic kidney disease: Secondary | ICD-10-CM | POA: Diagnosis not present

## 2016-12-16 DIAGNOSIS — E559 Vitamin D deficiency, unspecified: Secondary | ICD-10-CM | POA: Diagnosis not present

## 2016-12-16 DIAGNOSIS — M47814 Spondylosis without myelopathy or radiculopathy, thoracic region: Secondary | ICD-10-CM | POA: Insufficient documentation

## 2016-12-16 LAB — UA/M W/RFLX CULTURE, ROUTINE
Bilirubin, UA: NEGATIVE
Glucose, UA: NEGATIVE
KETONES UA: NEGATIVE
LEUKOCYTES UA: NEGATIVE
Nitrite, UA: NEGATIVE
Protein, UA: NEGATIVE
Specific Gravity, UA: 1.01 (ref 1.005–1.030)
Urobilinogen, Ur: 0.2 mg/dL (ref 0.2–1.0)
pH, UA: 5.5 (ref 5.0–7.5)

## 2016-12-16 LAB — CBC WITH DIFFERENTIAL/PLATELET
HEMATOCRIT: 40.2 % (ref 34.0–46.6)
HEMOGLOBIN: 13.7 g/dL (ref 11.1–15.9)
LYMPHS ABS: 2 10*3/uL (ref 0.7–3.1)
Lymphs: 29 %
MCH: 31.6 pg (ref 26.6–33.0)
MCHC: 34.1 g/dL (ref 31.5–35.7)
MCV: 93 fL (ref 79–97)
MID (Absolute): 0.7 10*3/uL (ref 0.1–1.6)
MID: 10 %
NEUTROS ABS: 4.2 10*3/uL (ref 1.4–7.0)
NEUTROS PCT: 61 %
Platelets: 159 10*3/uL (ref 150–379)
RBC: 4.33 x10E6/uL (ref 3.77–5.28)
RDW: 14.8 % (ref 12.3–15.4)
WBC: 6.9 10*3/uL (ref 3.4–10.8)

## 2016-12-16 LAB — MICROSCOPIC EXAMINATION

## 2016-12-16 MED ORDER — OMEPRAZOLE 20 MG PO CPDR: 20.0000 mg | DELAYED_RELEASE_CAPSULE | Freq: Every day | ORAL | 3 refills | Status: DC

## 2016-12-16 NOTE — Progress Notes (Signed)
BP 127/73 (BP Location: Left Arm, Patient Position: Sitting, Cuff Size: Large)   Pulse 85   Temp 97.9 F (36.6 C)   Ht 5' 3.1" (1.603 m)   Wt 182 lb 3.2 oz (82.6 kg)   LMP  (LMP Unknown)   SpO2 95%   BMI 32.17 kg/m    Subjective:    Patient ID: Cynthia Hurst, female    DOB: 07-20-1935, 82 y.o.   MRN: [id number]  HPI: Cynthia Hurst is a 82 y.o. female  Chief Complaint  Patient presents with  . Hypertension  . Gout   Hypertension Using medications without difficulty Average home BPs   No problems or lightheadedness No chest pain with exertion or shortness of breath No Edema  Gout No gout flares  Mid back pain Left sided.  Seen here about a week ago.  Noted blood in urine and given fFomax and Flexeril. Hurts with movement and with sneezing.  Sitting still makes it better.  No help with medications given.  No change in pain from 1 week ago.        GERD No heartburn  Relevant past medical, surgical, family and social history reviewed and updated as indicated. Interim medical history since our last visit reviewed. Allergies and medications reviewed and updated.  Review of Systems  Per HPI unless specifically indicated above     Objective:    BP 127/73 (BP Location: Left Arm, Patient Position: Sitting, Cuff Size: Large)   Pulse 85   Temp 97.9 F (36.6 C)   Ht 5' 3.1" (1.603 m)   Wt 182 lb 3.2 oz (82.6 kg)   LMP  (LMP Unknown)   SpO2 95%   BMI 32.17 kg/m   Wt Readings from Last 3 Encounters:  12/16/16 182 lb 3.2 oz (82.6 kg)  12/06/16 181 lb (82.1 kg)  09/19/16 184 lb 9.6 oz (83.7 kg)    Physical Exam  Constitutional: She is oriented to person, place, and time. She appears well-developed and well-nourished. No distress.  HENT:  Head: Normocephalic and atraumatic.  Eyes: Conjunctivae and lids are normal. Right eye exhibits no discharge. Left eye exhibits no discharge. No scleral icterus.  Neck: Normal range of motion. Neck supple. No JVD present.  Carotid bruit is not present.  Cardiovascular: Normal rate, regular rhythm and normal heart sounds.   Pulmonary/Chest: Effort normal and breath sounds normal.  Abdominal: Normal appearance. There is no splenomegaly or hepatomegaly.  Musculoskeletal:  Pt with increased pain with twisting and lateral bending.  Noted kyphosis and scoliosis  Neurological: She is alert and oriented to person, place, and time.  Skin: Skin is warm, dry and intact. No rash noted. No pallor.  Psychiatric: She has a normal mood and affect. Her behavior is normal. Judgment and thought content normal.   3 plus blood on dipstick but negative on microscopic.    Results for orders placed or performed in visit on 12/06/16  Microscopic Examination  Result Value Ref Range   WBC, UA None seen 0 - 5 /hpf   RBC, UA 0-2 0 - 2 /hpf   Epithelial Cells (non renal) None seen 0 - 10 /hpf   Bacteria, UA None seen None seen/Few  UA/M w/rflx Culture, Routine (STAT)  Result Value Ref Range   Specific Gravity, UA 1.025 1.005 - 1.030   pH, UA 5.5 5.0 - 7.5   Color, UA Yellow Yellow   Appearance Ur Clear Clear   Leukocytes, UA Negative Negative   Protein,  UA Negative Negative/Trace   Glucose, UA Negative Negative   Ketones, UA Negative Negative   RBC, UA 2+ (A) Negative   Bilirubin, UA Negative Negative   Urobilinogen, Ur 0.2 0.2 - 1.0 mg/dL   Nitrite, UA Negative Negative   Microscopic Examination See below:       Assessment & Plan:   Problem List Items Addressed This Visit      Unprioritized   Benign hypertension with chronic kidney disease   Relevant Orders   Comprehensive metabolic panel   Lipid Panel w/o Chol/HDL Ratio   GERD (gastroesophageal reflux disease)    Stable, continue present medications.        Relevant Medications   omeprazole (PRILOSEC) 20 MG capsule   Gout    Stable, continue present medications.        Vitamin D deficiency    Other Visit Diagnoses    Acute left-sided low back pain  without sciatica    -  Primary   Order chest x-ray and thoracic x-ray.  No blood in urine at this time.  Rx for Tylenol.  Stop Flexeril due to side effects and Flomax for no indication   Relevant Orders   CBC With Differential/Platelet   UA/M w/rflx Culture, Routine   DG Chest 2 View   DG Thoracic Spine W/Swimmers      PT if x-rays are negative  Follow up plan: Return in about 6 months (around 06/15/2017).

## 2016-12-16 NOTE — Assessment & Plan Note (Signed)
Stable, continue present medications.   

## 2016-12-17 LAB — COMPREHENSIVE METABOLIC PANEL
ALT: 9 IU/L (ref 0–32)
AST: 17 IU/L (ref 0–40)
Albumin/Globulin Ratio: 1.3 (ref 1.2–2.2)
Albumin: 3.9 g/dL (ref 3.5–4.8)
Alkaline Phosphatase: 111 IU/L (ref 39–117)
BUN/Creatinine Ratio: 17 (ref 12–28)
BUN: 17 mg/dL (ref 8–27)
Bilirubin Total: 0.5 mg/dL (ref 0.0–1.2)
CALCIUM: 9.9 mg/dL (ref 8.7–10.3)
CO2: 25 mmol/L (ref 18–29)
CREATININE: 1.01 mg/dL — AB (ref 0.57–1.00)
Chloride: 102 mmol/L (ref 96–106)
GFR calc Af Amer: 61 mL/min/{1.73_m2} (ref >59)
GFR, EST NON AFRICAN AMERICAN: 53 mL/min/{1.73_m2} — AB (ref >59)
GLOBULIN, TOTAL: 3.1 g/dL (ref 1.5–4.5)
GLUCOSE: 90 mg/dL (ref 65–99)
Potassium: 4.4 mmol/L (ref 3.5–5.2)
Sodium: 140 mmol/L (ref 134–144)
Total Protein: 7 g/dL (ref 6.0–8.5)

## 2016-12-17 LAB — LIPID PANEL W/O CHOL/HDL RATIO
CHOLESTEROL TOTAL: 178 mg/dL (ref 100–199)
HDL: 60 mg/dL (ref >39)
LDL CALC: 90 mg/dL (ref 0–99)
TRIGLYCERIDES: 141 mg/dL (ref 0–149)
VLDL Cholesterol Cal: 28 mg/dL (ref 5–40)

## 2016-12-19 ENCOUNTER — Encounter: Payer: Self-pay | Admitting: Family Medicine

## 2016-12-20 ENCOUNTER — Telehealth: Payer: Self-pay | Admitting: Unknown Physician Specialty

## 2016-12-20 NOTE — Telephone Encounter (Signed)
ERROR

## 2016-12-20 NOTE — Telephone Encounter (Signed)
Called patient last week and let her know about her x-ray (documentation in chart). I let her know the results again and that Malachy Mood entered the PT referral. I let her know that they should be calling her with an appointment. As for the medication, I let the patient know that we sent it in last month. I asked for the patient to give [place] a call if the pharmacy does not have it.

## 2016-12-20 NOTE — Telephone Encounter (Signed)
Patient had xray which was ordered by Malachy Mood and she would like to get her results if possible from that.  I did explain that Malachy Mood was out sick.  She also needs a refill sent to her mail order pharmacy on [address].  Thank You Santiago Glad

## 2016-12-20 NOTE — Telephone Encounter (Signed)
Called and left patient a VM asking for her to please return my call.  

## 2016-12-21 NOTE — Telephone Encounter (Signed)
Tried calling patient and a man answered and said the patient was not in. I gave the patient my name and asked for him to please have the patient call us we she could.

## 2016-12-23 NOTE — Telephone Encounter (Signed)
Called and left patient a VM asking for her to please return my call.  

## 2016-12-26 NOTE — Telephone Encounter (Signed)
Patient returned call.  Explained to patient you were in Clinic. Patient said she'd be around today around 10:00 if you could call her back.

## 2016-12-26 NOTE — Telephone Encounter (Signed)
Called and spoke to patient. She stated that she has sent off the money for her medication and stated that she only has 3 tablets left. I let the patient know that all of her prescriptions were sent in on 11/28/16 to [facility] so they should have her prescriptions. Patient stated that she was going to call them today to make sure they did and I asked for her to please call us back if they do not have her prescriptions.

## 2016-12-29 ENCOUNTER — Ambulatory Visit: Payer: Medicare Other

## 2017-01-02 ENCOUNTER — Ambulatory Visit: Payer: Medicare Other | Attending: Unknown Physician Specialty

## 2017-01-02 VITALS — BP 143/77 | HR 75

## 2017-01-02 DIAGNOSIS — M546 Pain in thoracic spine: Secondary | ICD-10-CM

## 2017-01-02 DIAGNOSIS — M545 Low back pain, unspecified: Secondary | ICD-10-CM

## 2017-01-02 DIAGNOSIS — M6281 Muscle weakness (generalized): Secondary | ICD-10-CM | POA: Diagnosis not present

## 2017-01-02 NOTE — Therapy (Signed)
[facility] [address] [place], [address] Phone: [phone]   Fax:  [phone]  Physical Therapy Evaluation  Patient Details  Name: Cynthia Hurst MRN: [id number] Date of Birth: Mar 04, 1935 Referring Provider: Kathrine Haddock, NP  Encounter Date: 01/02/2017      PT End of Session - 01/02/17 0928    Visit Number 1   Number of Visits 9   Date for PT Re-Evaluation 02/02/17   Authorization Type 1   Authorization Time Period of 10 g code   PT Start Time 0929   PT Stop Time 1033   PT Time Calculation (min) 64 min   Activity Tolerance Patient tolerated treatment well   Behavior During Therapy [facility] for tasks assessed/performed      Past Medical History:  Diagnosis Date  . Chronic kidney disease   . GERD (gastroesophageal reflux disease)   . Gout   . Hypertension   . Hypopotassemia   . Osteoarthritis   . Osteoporosis    LUMBAR SPINE  . Vitamin B 12 deficiency   . Vitamin D deficiency     Past Surgical History:  Procedure Laterality Date  . ABDOMINAL HYSTERECTOMY     PARTIAL  . JOINT REPLACEMENT  2010,2011   KNEE    Vitals:   01/02/17 0936  BP: (!) 143/77  Pulse: 75         Subjective Assessment - 01/02/17 0935    Subjective L back pain: 2/10 (pt sitting when pt turned to the R eases quickly with rest); 0/10 at best; 4/10 (at worst, pt does not know what increased it)    Pertinent History L low back pain. Sudden onset since a little over a month ago. Feels pain in her L trunk. Feels better since initial onset. Had an x-ray in which her MD thinks her pain is due to arthritis. Has difficulty sleeping at night due to positioning. Using the head pad helps.  Denies loss of bladder or bowel control. Denies LE tingling and numbness. Pt also adds feeling cramps in her L LE (L gastroc).    Patient Stated Goals Walk better   Currently in Pain? Yes   Pain Location Back   Pain Orientation  Left;Lateral   Pain Descriptors / Indicators Throbbing;Tender;Aching   Pain Type Acute pain   Pain Onset More than a month ago   Pain Frequency Occasional   Aggravating Factors  laying on her L side, supine, turning to the L and to the R   Pain Relieving Factors R S/L, heating pad, sitting too long, massager            [facility] PT Assessment - 01/02/17 0952      Assessment   Medical Diagnosis L sided thoracic back pain   Referring Provider Kathrine Haddock, NP   Onset Date/Surgical Date 11/11/16  a little over a month ago, no date provided   Prior Therapy No known physical therapy for current position     Precautions   Precaution Comments osteoporosis lumbar spine per medical history     Restrictions   Other Position/Activity Restrictions no known restrictions     Balance Screen   Has the patient fallen in the past 6 months No   Has the patient had a decrease in activity level because of a fear of falling?  No   Is the patient reluctant to leave their home because of a fear of falling?  No  Home Environment   Additional Comments Patient lives in a 1 story home with her husband, no stairs     Prior Function   Vocation Retired   Biomedical scientist PLOF: better able to turn, lie down on her L side and on her back with less back pain.      Observation/Other Assessments   Observations (-) Slump test   Modified [place] 10%     Posture/Postural Control   Posture Comments Slight R hip ER, bilateral foot prontation, slight R trunk rotation, R lateral shift, bilaterally protracted shoulders and neck, movement crease around L3/L4 area     AROM   Lumbar Flexion limited with R posterior thigh tightness   Lumbar Extension limited (no thoracic extension)   Lumbar - Right Side [facility] Dt with reproduction of symptoms (took longer to ease off when after returning to starting position the second R trunk side bend movement attempt)    Lumbar - Left Side [facility] with L posterior arm  catching sensation   Lumbar - Right Rotation WFL with L trunk pulling sensation   Lumbar - Left Rotation [facility]     Strength   Right Hip Flexion 4/5   Right Hip Extension 4+/5  seated, manually resisted   Right Hip ABduction 4-/5   Left Hip Flexion 4/5   Left Hip Extension 4/5  seated, manually resisted   Left Hip ABduction 4/5   Right Knee Flexion 5/5   Right Knee Extension 5/5   Left Knee Flexion 5/5   Left Knee Extension 5/5     Palpation   Patella mobility --   Palpation comment Bilateral lumbar paraspinal muscle tension R > L; TTP L lateral lower ribs with reproduction of symptoms (no symptoms with breathing).  No TTP L or R lateral hip     Ambulation/Gait   Gait Comments lateral lean during ipsilateral stance phase; minimal trunk rotation, decreased base of support.   No pain with walking.       Objectives   There-ex  L shoulder adduction resisting yellow band 10x2, then 10x5 seconds   Standing  R lateral shift correction 10x5 seconds for 2 sets.   Standing bilateral scapular retraction 10x2  Reviewed and given as part of her HEP. Pt demonstrated and verbalized understanding.   Improved exercise technique, movement at target joints, use of target muscles after mod verbal, visual, tactile cues.                         PT Education - 01/02/17 1317    Education provided Yes   Education Details ther-ex, HEP, plan of care   Person(s) Educated Patient   Methods Explanation;Demonstration;Tactile cues;Verbal cues;Handout   Comprehension Returned demonstration;Verbalized understanding             PT Long Term Goals - 01/02/17 1251      PT LONG TERM GOAL #1   Title Patient will have a decrease in back pain to 2/10 or less at worst to promote ability to perform functional tasks, turn, and tolerate positions such as lying on her L side.    Baseline 4/10 at worst (01/02/2017)   Time 4   Period Weeks   Status New     PT LONG TERM GOAL #2   Title  Patient will improve bilateral hip abduction and extension strength by at least 1/2 MMT to promote ability to perform functional tasks.    Time 4   Period Weeks  Status New     PT LONG TERM GOAL #3   Title Patient will improve her Modified Oswestry Low Back Pain Disability Questionnaire by at least 6% as a demonstration of improved function.    Baseline 10% (01/02/2017)   Time 6   Period Weeks   Status New               Plan - 01/02/17 1242    Clinical Impression Statement Patient is a 82 year old female who came to physical therapy secondary to L low back pain which began about 1 month ago. She also presents with altered gait pattern and posture, bilateral hip weakness, reproduction of symptoms with lumbar side bending and rotation; TTP, and difficulty performing functional tasks such as turning, as well as tolerating position such as lying on her L side. Patient will benefit from skilled physical therapy services to address the aforementioned deficits.    Rehab Potential Good   Clinical Impairments Affecting Rehab Potential scoliosis   PT Frequency 2x / week   PT Duration 4 weeks   PT Treatment/Interventions Therapeutic exercise;Therapeutic activities;Manual techniques;Neuromuscular re-education;Dry needling;Patient/family education;Iontophoresis 4mg /ml Dexamethasone;Traction;Ultrasound;Electrical Stimulation;Aquatic Therapy   PT Next Visit Plan scapular strengthening, gentle thoracic extension, hip strengthening   Consulted and Agree with Plan of Care Patient      Patient will benefit from skilled therapeutic intervention in order to improve the following deficits and impairments:  Pain, Improper body mechanics, Postural dysfunction, Decreased strength  Visit Diagnosis: Pain in thoracic spine - Plan: PT plan of care cert/re-cert  Acute left-sided low back pain without sciatica - Plan: PT plan of care cert/re-cert  Muscle weakness (generalized) - Plan: PT plan of care  cert/re-cert      G-Codes - [id number]    Functional Assessment Tool Used Modified Oswestry Low Back Pain Disability Questionnaire, patient interview, clinical presentation   Functional Limitation Mobility: Walking and moving around   Mobility: Walking and Moving Around Current Status [id number]) At least 1 percent but less than 20 percent impaired, limited or restricted   Mobility: Walking and Moving Around Goal Status [id number]) 0 percent impaired, limited or restricted       Problem List Patient Active Problem List   Diagnosis Date Noted  . GERD (gastroesophageal reflux disease) 12/16/2016  . Allergic rhinitis 09/19/2016  . Muscle cramps 12/11/2015  . Benign hypertension with chronic kidney disease 12/11/2015  . Gout 06/10/2015  . CKD (chronic kidney disease), stage III 06/10/2015  . Vitamin B12 deficiency 06/10/2015  . Vitamin D deficiency 06/10/2015    Joneen Boers PT, DPT   01/02/2017, 1:23 PM  [facility] [facility] [address] [place], [address] Phone: [phone]   Fax:  [phone]  Name: Cynthia Hurst MRN: [id number] Date of Birth: 03-05-1935

## 2017-01-02 NOTE — Patient Instructions (Signed)
  Scapular Retraction: Rowing (Eccentric) - Arms - 45 Degrees (Resistance Band)     Scapular Retraction (Standing)   With arms at sides, pinch shoulder blades together. Hold for 5 seconds. Repeat __10__ times per set. Do __3__ sets per session.     Copyright  VHI. All rights reserved.

## 2017-01-04 ENCOUNTER — Ambulatory Visit: Payer: Medicare Other

## 2017-01-04 DIAGNOSIS — M546 Pain in thoracic spine: Secondary | ICD-10-CM | POA: Diagnosis not present

## 2017-01-04 DIAGNOSIS — M6281 Muscle weakness (generalized): Secondary | ICD-10-CM | POA: Diagnosis not present

## 2017-01-04 DIAGNOSIS — M545 Low back pain, unspecified: Secondary | ICD-10-CM

## 2017-01-04 NOTE — Therapy (Signed)
[facility] [address] [place], [address] Phone: [phone]   Fax:  [phone]  Physical Therapy Treatment  Patient Details  Name: Cynthia Hurst MRN: [id number] Date of Birth: [birth year] Referring Provider: Kathrine Haddock, NP  Encounter Date: 01/04/2017      PT End of Session - 01/04/17 0801    Visit Number 2   Number of Visits 9   Date for PT Re-Evaluation 02/02/17   Authorization Type 2   Authorization Time Period of 10 g code   PT Start Time 0801   PT Stop Time 0842   PT Time Calculation (min) 41 min   Activity Tolerance Patient tolerated treatment well   Behavior During Therapy [facility] for tasks assessed/performed      Past Medical History:  Diagnosis Date  . Chronic kidney disease   . GERD (gastroesophageal reflux disease)   . Gout   . Hypertension   . Hypopotassemia   . Osteoarthritis   . Osteoporosis    LUMBAR SPINE  . Vitamin B 12 deficiency   . Vitamin D deficiency     Past Surgical History:  Procedure Laterality Date  . ABDOMINAL HYSTERECTOMY     PARTIAL  . JOINT REPLACEMENT  2010,2011   KNEE    There were no vitals filed for this visit.      Subjective Assessment - 01/04/17 0803    Subjective L back feels sore. 3-4/10 pain currently.    Pertinent History L low back pain. Sudden onset since a little over a month ago. Feels pain in her L trunk. Feels better since initial onset. Had an x-ray in which her MD thinks her pain is due to arthritis. Has difficulty sleeping at night due to positioning. Using the head pad helps.  Denies loss of bladder or bowel control. Denies LE tingling and numbness. Pt also adds feeling cramps in her L LE (L gastroc).    Patient Stated Goals Walk better   Currently in Pain? Yes   Pain Score 4   3-4/10   Pain Onset More than a month ago                                 PT Education - 01/04/17 0843    Education provided  Yes   Education Details ther-ex, HEP   Person(s) Educated Patient   Methods Explanation;Demonstration;Tactile cues;Verbal cues;Handout   Comprehension Verbalized understanding;Returned demonstration       Objectives  There-ex  Directed patient with seated R trunk side bend 10x5 seconds. Increased L sided soreness Seated L trunk side bend 10x5 seconds for 2 sets. Decreased soreness. Standing L shoulder adduction resisting yellow band 10x2 with 5 second holds Standing/sitting L scapular retraction mid rows resisting yellow band 10x 5 seconds for 2 sets. No low back catching when performed in sitting.  Seated L scapular retraction 10x5 seconds for 2 sets Supine open books to promote pecotralis muscle stretch 5x2 with 5 second holds Log rolling sit <> supine 2x  Improved exercise technique, movement at target joints, use of target muscles after mod verbal, visual, tactile cues.     Pt demonstrates L trunk side bend directional preference with reduction of symptoms. Cues needed to promote posterior tipping of L scapula and decreasing L shoulder shrug during scapular retraction. 0/10 back soreness after session.        PT Long Term Goals -  01/02/17 1251      PT LONG TERM GOAL #1   Title Patient will have a decrease in back pain to 2/10 or less at worst to promote ability to perform functional tasks, turn, and tolerate positions such as lying on her L side.    Baseline 4/10 at worst (01/02/2017)   Time 4   Period Weeks   Status New     PT LONG TERM GOAL #2   Title Patient will improve bilateral hip abduction and extension strength by at least 1/2 MMT to promote ability to perform functional tasks.    Time 4   Period Weeks   Status New     PT LONG TERM GOAL #3   Title Patient will improve her Modified Oswestry Low Back Pain Disability Questionnaire by at least 6% as a demonstration of improved function.    Baseline 10% (01/02/2017)   Time 6   Period Weeks   Status New                Plan - 01/04/17 1609    Clinical Impression Statement Pt demonstrates L trunk side bend directional preference with reduction of symptoms. Cues needed to promote posterior tipping of L scapula and decreasing L shoulder shrug during scapular retraction. 0/10 back soreness after session.    Rehab Potential Good   Clinical Impairments Affecting Rehab Potential scoliosis   PT Frequency 2x / week   PT Duration 4 weeks   PT Treatment/Interventions Therapeutic exercise;Therapeutic activities;Manual techniques;Neuromuscular re-education;Dry needling;Patient/family education;Iontophoresis 4mg /ml Dexamethasone;Traction;Ultrasound;Electrical Stimulation;Aquatic Therapy   PT Next Visit Plan scapular strengthening, gentle thoracic extension, hip strengthening   Consulted and Agree with Plan of Care Patient      Patient will benefit from skilled therapeutic intervention in order to improve the following deficits and impairments:  Pain, Improper body mechanics, Postural dysfunction, Decreased strength  Visit Diagnosis: Pain in thoracic spine  Acute left-sided low back pain without sciatica  Muscle weakness (generalized)     Problem List Patient Active Problem List   Diagnosis Date Noted  . GERD (gastroesophageal reflux disease) 12/16/2016  . Allergic rhinitis 09/19/2016  . Muscle cramps 12/11/2015  . Benign hypertension with chronic kidney disease 12/11/2015  . Gout 06/10/2015  . CKD (chronic kidney disease), stage III 06/10/2015  . Vitamin B12 deficiency 06/10/2015  . Vitamin D deficiency 06/10/2015    Joneen Boers PT, DPT   01/04/2017, 4:13 PM  [place] [facility] [address], [place], [address] Phone: [phone]   Fax:  [phone]  Name: JERINA TAFFE MRN: [id number] Date of Birth: 09-15-1935

## 2017-01-04 NOTE — Patient Instructions (Signed)
Seated Stretch: Side Bend     Stay on your chair or bed. With feet on floor and buttocks firmly on chair or bed, slowly slide left arm toward floor to feel a stretch on your right low back. Hold _5 seconds___ seconds.  Repeat __10__ times. Do __2__ sessions per day.  http://[email or web address].exer.us/801   Copyright  VHI. All rights reserved.

## 2017-01-09 ENCOUNTER — Telehealth: Payer: Self-pay | Admitting: Unknown Physician Specialty

## 2017-01-10 ENCOUNTER — Ambulatory Visit: Payer: Medicare Other

## 2017-01-10 DIAGNOSIS — M545 Low back pain, unspecified: Secondary | ICD-10-CM

## 2017-01-10 DIAGNOSIS — M6281 Muscle weakness (generalized): Secondary | ICD-10-CM | POA: Diagnosis not present

## 2017-01-10 DIAGNOSIS — M546 Pain in thoracic spine: Secondary | ICD-10-CM

## 2017-01-10 NOTE — Patient Instructions (Signed)
  Pt was recommended to sleep on her R side to help with her posture so long as she is comfortable with it. Pt demonstrated and verbalized understanding.

## 2017-01-10 NOTE — Telephone Encounter (Signed)
Called and left patient a VM asking for her to please return my call.  

## 2017-01-10 NOTE — Therapy (Signed)
[facility] [address] [place], [address] Phone: [phone]   Fax:  [phone]  Physical Therapy Treatment  Patient Details  Name: Cynthia Hurst MRN: [id number] Date of Birth: October 23, 1935 Referring Provider: Kathrine Haddock, NP  Encounter Date: 01/10/2017      PT End of Session - 01/10/17 0933    Visit Number 3   Number of Visits 9   Date for PT Re-Evaluation 02/02/17   Authorization Type 3   Authorization Time Period of 10 g code   PT Start Time 0933   PT Stop Time 1017   PT Time Calculation (min) 44 min   Activity Tolerance Patient tolerated treatment well   Behavior During Therapy [facility] for tasks assessed/performed      Past Medical History:  Diagnosis Date  . Chronic kidney disease   . GERD (gastroesophageal reflux disease)   . Gout   . Hypertension   . Hypopotassemia   . Osteoarthritis   . Osteoporosis    LUMBAR SPINE  . Vitamin B 12 deficiency   . Vitamin D deficiency     Past Surgical History:  Procedure Laterality Date  . ABDOMINAL HYSTERECTOMY     PARTIAL  . JOINT REPLACEMENT  2010,2011   KNEE    There were no vitals filed for this visit.      Subjective Assessment - 01/10/17 0934    Subjective L back feels good now but sometimes the pain feels like it goes and comes. 3/10 currently. 4/10 at most for the past 7 days.    Pertinent History L low back pain. Sudden onset since a little over a month ago. Feels pain in her L trunk. Feels better since initial onset. Had an x-ray in which her MD thinks her pain is due to arthritis. Has difficulty sleeping at night due to positioning. Using the head pad helps.  Denies loss of bladder or bowel control. Denies LE tingling and numbness. Pt also adds feeling cramps in her L LE (L gastroc).    Patient Stated Goals Walk better   Currently in Pain? Yes   Pain Score 3    Pain Onset More than a month ago                                  PT Education - 01/10/17 0935    Education provided Yes   Education Details ther-ex, sleeping on R side if able   Person(s) Educated Patient   Methods Explanation;Demonstration;Tactile cues;Verbal cues   Comprehension Returned demonstration;Verbalized understanding        Objectives  There-ex  Directed patient with seated L trunk side bend 10x5 seconds for 2 sets. Decreased soreness. Pt was recommended to not lean as much to the L when performing this HEP. Pt demonstrated and verbalized understanding.   Seated L scapular retraction 10x5 seconds for 2 sets Standing L shoulder adduction resisting yellow band 10x3 with 5 second holds sitting L scapular retraction mid rows resisting yellow band 10x 5 seconds for 2 sets. R S/L clam shell 5x2 for glute med strength and decrease R lateral shift posture.   Supine open books to promote pecotralis muscle stretch 5x2 with 5 second holds to promote gentle thoracic extension as well.   Standing bilateral shoulder extension resisting yellow band 10x3 to promote gentle trunk strengthening and thoracic extension.   Side stepping 32 ft to the  R and 32 ft to the L for glute med strengthening.   Sit <> stand from low mat table without UE assist 10x to promote LE strengthening.    Improved exercise technique, movement at target joints, use of target muscles after mod verbal, visual, tactile cues.      0/10 back pain at end of session. Continue working on gentle L trunk side bending, gentle thoracic extension, trunk and hip strengthening, posture.         PT Long Term Goals - 01/02/17 1251      PT LONG TERM GOAL #1   Title Patient will have a decrease in back pain to 2/10 or less at worst to promote ability to perform functional tasks, turn, and tolerate positions such as lying on her L side.    Baseline 4/10 at worst (01/02/2017)   Time 4   Period Weeks   Status New     PT LONG  TERM GOAL #2   Title Patient will improve bilateral hip abduction and extension strength by at least 1/2 MMT to promote ability to perform functional tasks.    Time 4   Period Weeks   Status New     PT LONG TERM GOAL #3   Title Patient will improve her Modified Oswestry Low Back Pain Disability Questionnaire by at least 6% as a demonstration of improved function.    Baseline 10% (01/02/2017)   Time 6   Period Weeks   Status New               Plan - 01/10/17 [id number]    Clinical Impression Statement 0/10 back pain at end of session. Continue working on gentle L trunk side bending, gentle thoracic extension, trunk and hip strengthening, posture.    Rehab Potential Good   Clinical Impairments Affecting Rehab Potential scoliosis   PT Frequency 2x / week   PT Duration 4 weeks   PT Treatment/Interventions Therapeutic exercise;Therapeutic activities;Manual techniques;Neuromuscular re-education;Dry needling;Patient/family education;Iontophoresis 4mg /ml Dexamethasone;Traction;Ultrasound;Electrical Stimulation;Aquatic Therapy   PT Next Visit Plan scapular strengthening, gentle thoracic extension, hip strengthening   Consulted and Agree with Plan of Care Patient      Patient will benefit from skilled therapeutic intervention in order to improve the following deficits and impairments:  Pain, Improper body mechanics, Postural dysfunction, Decreased strength  Visit Diagnosis: Pain in thoracic spine  Acute left-sided low back pain without sciatica  Muscle weakness (generalized)     Problem List Patient Active Problem List   Diagnosis Date Noted  . GERD (gastroesophageal reflux disease) 12/16/2016  . Allergic rhinitis 09/19/2016  . Muscle cramps 12/11/2015  . Benign hypertension with chronic kidney disease 12/11/2015  . Gout 06/10/2015  . CKD (chronic kidney disease), stage III 06/10/2015  . Vitamin B12 deficiency 06/10/2015  . Vitamin D deficiency 06/10/2015    Joneen Boers PT,  DPT   01/10/2017, 10:26 AM  [facility] [address], [place], [address] Phone: [phone]   Fax:  [phone]  Name: Cynthia Hurst MRN: [id number] Date of Birth: 02-10-1935

## 2017-01-11 NOTE — Telephone Encounter (Signed)
Called and left patient a VM asking for her to please return my call.  

## 2017-01-12 ENCOUNTER — Ambulatory Visit: Payer: Medicare Other | Attending: Unknown Physician Specialty

## 2017-01-12 DIAGNOSIS — M546 Pain in thoracic spine: Secondary | ICD-10-CM | POA: Insufficient documentation

## 2017-01-12 DIAGNOSIS — M545 Low back pain, unspecified: Secondary | ICD-10-CM

## 2017-01-12 DIAGNOSIS — M6281 Muscle weakness (generalized): Secondary | ICD-10-CM | POA: Diagnosis not present

## 2017-01-12 NOTE — Therapy (Signed)
[facility] [address], [place], [address] Phone: [phone]   Fax:  [phone]  Physical Therapy Treatment  Patient Details  Name: Cynthia Hurst MRN: [id number] Date of Birth: [birth year] Referring Provider: Kathrine Haddock, NP  Encounter Date: 01/12/2017      PT End of Session - 01/12/17 1031    Visit Number 4   Number of Visits 9   Date for PT Re-Evaluation 02/02/17   Authorization Type 4   Authorization Time Period of 10 g code   PT Start Time 1031   PT Stop Time 1116   PT Time Calculation (min) 45 min   Activity Tolerance Patient tolerated treatment well   Behavior During Therapy [facility] for tasks assessed/performed      Past Medical History:  Diagnosis Date  . Chronic kidney disease   . GERD (gastroesophageal reflux disease)   . Gout   . Hypertension   . Hypopotassemia   . Osteoarthritis   . Osteoporosis    LUMBAR SPINE  . Vitamin B 12 deficiency   . Vitamin D deficiency     Past Surgical History:  Procedure Laterality Date  . ABDOMINAL HYSTERECTOMY     PARTIAL  . JOINT REPLACEMENT  2010,2011   KNEE    There were no vitals filed for this visit.      Subjective Assessment - 01/12/17 1032    Subjective Back is doing ok. Did not get to do her HEP as musch as she could due to her stomach not feeling well. Better today. No fever or vomiting. No back pain currently.    Pertinent History L low back pain. Sudden onset since a little over a month ago. Feels pain in her L trunk. Feels better since initial onset. Had an x-ray in which her MD thinks her pain is due to arthritis. Has difficulty sleeping at night due to positioning. Using the head pad helps.  Denies loss of bladder or bowel control. Denies LE tingling and numbness. Pt also adds feeling cramps in her L LE (L gastroc).    Patient Stated Goals Walk better   Currently in Pain? No/denies   Pain Score 0-No pain   Pain Onset More than  a month ago                                 PT Education - 01/12/17 1049    Education provided Yes   Education Details ther-ex   [employer]) Educated Patient   Methods Explanation;Demonstration;Tactile cues;Verbal cues   Comprehension Verbalized understanding;Returned demonstration        Objectives  There-ex  Directed patient with standing L shoulder adduction resisting yellow band 10x3 with 5 second holds   Standing bilateral shoulder extension resisting yellow band 10x3 to promote gentle trunk strengthening and thoracic extension.   Standing pallof press resisting L trunk rotation resisting yellow band 10x5 seconds.     seated L scapular retraction 10x5 seconds for 2 sets  R lateral neck discomfort which eases with L scapular protraction as well as R cervical rotation  Seated L scapular protraction 10x5 seconds  Seated R cervical rotation 10x  Decreased R lateral neck pain   R S/L clam shell 5x3 for glute med strength and decrease R lateral shift posture.   Supine open books to promote pecotralis muscle stretch 10x2 with 5 second holds to promote gentle thoracic extension as  well.   Side stepping 32 ft to the R and 32 ft to the L for glute med strengthening.     Improved exercise technique, movement at target joints, use of target muscles after mod verbal, visual, tactile cues.      Good carry over of decreased back pain from last session. No back pain throughout session. Just tiredness in her back. R lateral neck discomfort after L scapular retraction exercise which eases with L scapular protraction as well as R cervical rotation.                    PT Long Term Goals - 01/02/17 1251      PT LONG TERM GOAL #1   Title Patient will have a decrease in back pain to 2/10 or less at worst to promote ability to perform functional tasks, turn, and tolerate positions such as lying on her L side.    Baseline 4/10 at worst  (01/02/2017)   Time 4   Period Weeks   Status New     PT LONG TERM GOAL #2   Title Patient will improve bilateral hip abduction and extension strength by at least 1/2 MMT to promote ability to perform functional tasks.    Time 4   Period Weeks   Status New     PT LONG TERM GOAL #3   Title Patient will improve her Modified Oswestry Low Back Pain Disability Questionnaire by at least 6% as a demonstration of improved function.    Baseline 10% (01/02/2017)   Time 6   Period Weeks   Status New               Plan - 01/12/17 1054    Clinical Impression Statement Good carry over of decreased back pain from last session. No back pain throughout session. Just tiredness in her back. R lateral neck discomfort after L scapular retraction exercise which eases with L scapular protraction as well as R cervical rotation.    Rehab Potential Good   Clinical Impairments Affecting Rehab Potential scoliosis   PT Frequency 2x / week   PT Duration 4 weeks   PT Treatment/Interventions Therapeutic exercise;Therapeutic activities;Manual techniques;Neuromuscular re-education;Dry needling;Patient/family education;Iontophoresis 4mg /ml Dexamethasone;Traction;Ultrasound;Electrical Stimulation;Aquatic Therapy   PT Next Visit Plan scapular strengthening, gentle thoracic extension, hip strengthening   Consulted and Agree with Plan of Care Patient      Patient will benefit from skilled therapeutic intervention in order to improve the following deficits and impairments:  Pain, Improper body mechanics, Postural dysfunction, Decreased strength  Visit Diagnosis: Pain in thoracic spine  Acute left-sided low back pain without sciatica  Muscle weakness (generalized)     Problem List Patient Active Problem List   Diagnosis Date Noted  . GERD (gastroesophageal reflux disease) 12/16/2016  . Allergic rhinitis 09/19/2016  . Muscle cramps 12/11/2015  . Benign hypertension with chronic kidney disease 12/11/2015   . Gout 06/10/2015  . CKD (chronic kidney disease), stage III 06/10/2015  . Vitamin B12 deficiency 06/10/2015  . Vitamin D deficiency 06/10/2015    Joneen Boers PT, DPT   01/12/2017, 1:38 PM   [facility] [address] [place], [address] Phone: [phone]   Fax:  [phone]  Name: KORRINE PERL MRN: [id number] Date of Birth: 20-Dec-1934

## 2017-01-12 NOTE — Telephone Encounter (Signed)
Called and spoke with a gentlemen. He stated that the patient was not in right now so I gave him my name and phone number and asked to have the patient return my call when she could. The gentlemen stated that he would tell her.

## 2017-01-16 NOTE — Telephone Encounter (Signed)
Called pharmacy to see what the issue may be after multiple attempts at reaching the patient. Pharmacy told me and patient's nifedipine, raloxifene, and omeprazole have already been sent out to the patient. Pharmacy stated that they have the patient's other medications on hold because they are waiting on a check from the patient. Pharmacy stated that patient was supposed to be sending in a check to pay for her medications. Will try to call patient again.

## 2017-01-16 NOTE — Telephone Encounter (Signed)
Called and spoke to patient. I let her know what the pharmacy told me and patient stated she was going to call them herself. I asked for her to please give me a call back if I could do anything else for her.

## 2017-01-17 ENCOUNTER — Ambulatory Visit: Payer: Medicare Other

## 2017-01-17 DIAGNOSIS — M545 Low back pain, unspecified: Secondary | ICD-10-CM

## 2017-01-17 DIAGNOSIS — M6281 Muscle weakness (generalized): Secondary | ICD-10-CM

## 2017-01-17 DIAGNOSIS — M546 Pain in thoracic spine: Secondary | ICD-10-CM

## 2017-01-17 NOTE — Therapy (Signed)
[facility] [address], [place], [address] Phone: [phone]   Fax:  [phone]  Physical Therapy Treatment  Patient Details  Name: Cynthia Hurst MRN: [id number] Date of Birth: 04-Mar-1935 Referring Provider: Kathrine Haddock, NP  Encounter Date: 01/17/2017      PT End of Session - 01/17/17 1032    Visit Number 5   Number of Visits 9   Date for PT Re-Evaluation 02/02/17   Authorization Type 5   Authorization Time Period of 10 g code   PT Start Time 1032   PT Stop Time 1112   PT Time Calculation (min) 40 min   Activity Tolerance Patient tolerated treatment well   Behavior During Therapy [facility] for tasks assessed/performed      Past Medical History:  Diagnosis Date  . Chronic kidney disease   . GERD (gastroesophageal reflux disease)   . Gout   . Hypertension   . Hypopotassemia   . Osteoarthritis   . Osteoporosis    LUMBAR SPINE  . Vitamin B 12 deficiency   . Vitamin D deficiency     Past Surgical History:  Procedure Laterality Date  . ABDOMINAL HYSTERECTOMY     PARTIAL  . JOINT REPLACEMENT  2010,2011   KNEE    There were no vitals filed for this visit.      Subjective Assessment - 01/17/17 1034    Subjective Back is doing good. No pain currently. Had a couple of pains Sunday but did not last longer than an hour which is less than usual. Able to sleep on her R side better.  4/10 at most for the past 7 days but does not last as long.    Pertinent History L low back pain. Sudden onset since a little over a month ago. Feels pain in her L trunk. Feels better since initial onset. Had an x-ray in which her MD thinks her pain is due to arthritis. Has difficulty sleeping at night due to positioning. Using the head pad helps.  Denies loss of bladder or bowel control. Denies LE tingling and numbness. Pt also adds feeling cramps in her L LE (L gastroc).    Patient Stated Goals Walk better   Currently in  Pain? No/denies   Pain Score 0-No pain   Pain Onset More than a month ago                                 PT Education - 01/17/17 1041    Education provided Yes   Education Details ther-ex   [employer]) Educated Patient   Methods Explanation;Demonstration;Tactile cues;Verbal cues   Comprehension Verbalized understanding;Returned demonstration        Objectives  There-ex  Directed patient with seated gentle thoracic extension over chair 10x5 seconds for 3 sets  Supine open books to promote pecotralis muscle stretch 10x2 with 5 second holds, then 5x5 seconds to promote gentle thoracic extension as well.   R S/L clam shell 5x3 for glute med strength and decrease R lateral shift posture.   Standing pallof press resisting L trunk rotation resisting yellow band 10x5 seconds for 2 sets  standing L shoulder adduction resisting yellow band 10x3with 5 second holds   Standing bilateral shoulder extension resisting yellow band 10x5 seconds, then 10x to promote gentle trunk strengthening and thoracic extension.   Standing hip abduction with bilateral UE assist 10x, then 5x each  LE to promote glute med strengthening.    Improved exercise technique, movement at target joints, use of target muscles after min to mod verbal, visual, tactile cues.     Good carry over of decreased current pain level from last session. Decreasing duration of pain level at worst. Pt making good progress with physical therapy towards pain goals.               PT Long Term Goals - 01/02/17 1251      PT LONG TERM GOAL #1   Title Patient will have a decrease in back pain to 2/10 or less at worst to promote ability to perform functional tasks, turn, and tolerate positions such as lying on her L side.    Baseline 4/10 at worst (01/02/2017)   Time 4   Period Weeks   Status New     PT LONG TERM GOAL #2   Title Patient will improve bilateral hip abduction and extension strength  by at least 1/2 MMT to promote ability to perform functional tasks.    Time 4   Period Weeks   Status New     PT LONG TERM GOAL #3   Title Patient will improve her Modified Oswestry Low Back Pain Disability Questionnaire by at least 6% as a demonstration of improved function.    Baseline 10% (01/02/2017)   Time 6   Period Weeks   Status New               Plan - 01/17/17 1043    Clinical Impression Statement Good carry over of decreased current pain level from last session. Decreasing duration of pain level at worst. Pt making good progress with physical therapy towards pain goals.    Rehab Potential Good   Clinical Impairments Affecting Rehab Potential scoliosis   PT Frequency 2x / week   PT Duration 4 weeks   PT Treatment/Interventions Therapeutic exercise;Therapeutic activities;Manual techniques;Neuromuscular re-education;Dry needling;Patient/family education;Iontophoresis 4mg /ml Dexamethasone;Traction;Ultrasound;Electrical Stimulation;Aquatic Therapy   PT Next Visit Plan scapular strengthening, gentle thoracic extension, hip strengthening   Consulted and Agree with Plan of Care Patient      Patient will benefit from skilled therapeutic intervention in order to improve the following deficits and impairments:  Pain, Improper body mechanics, Postural dysfunction, Decreased strength  Visit Diagnosis: Pain in thoracic spine  Acute left-sided low back pain without sciatica  Muscle weakness (generalized)     Problem List Patient Active Problem List   Diagnosis Date Noted  . GERD (gastroesophageal reflux disease) 12/16/2016  . Allergic rhinitis 09/19/2016  . Muscle cramps 12/11/2015  . Benign hypertension with chronic kidney disease 12/11/2015  . Gout 06/10/2015  . CKD (chronic kidney disease), stage III 06/10/2015  . Vitamin B12 deficiency 06/10/2015  . Vitamin D deficiency 06/10/2015   Joneen Boers PT, DPT   01/17/2017, 11:19 AM  [facility] [address] [place], [address] Phone: [phone]   Fax:  [phone]  Name: Cynthia Hurst MRN: [id number] Date of Birth: 09/28/1935

## 2017-01-19 ENCOUNTER — Ambulatory Visit: Payer: Medicare Other

## 2017-01-19 DIAGNOSIS — M545 Low back pain, unspecified: Secondary | ICD-10-CM

## 2017-01-19 DIAGNOSIS — M6281 Muscle weakness (generalized): Secondary | ICD-10-CM

## 2017-01-19 DIAGNOSIS — M546 Pain in thoracic spine: Secondary | ICD-10-CM

## 2017-01-19 NOTE — Therapy (Signed)
[facility] [address], [place], [address] Phone: [phone]   Fax:  [phone]  Physical Therapy Treatment  Patient Details  Name: Cynthia Hurst MRN: [id number] Date of Birth: [birth year] Referring Provider: Kathrine Haddock, NP  Encounter Date: 01/19/2017      PT End of Session - 01/19/17 1123    Visit Number 6   Number of Visits 9   Date for PT Re-Evaluation 02/02/17   Authorization Type 6   Authorization Time Period of 10 g code   PT Start Time [id number]  pt arrived late   PT Stop Time 1206   PT Time Calculation (min) 43 min   Activity Tolerance Patient tolerated treatment well   Behavior During Therapy [facility] for tasks assessed/performed      Past Medical History:  Diagnosis Date  . Chronic kidney disease   . GERD (gastroesophageal reflux disease)   . Gout   . Hypertension   . Hypopotassemia   . Osteoarthritis   . Osteoporosis    LUMBAR SPINE  . Vitamin B 12 deficiency   . Vitamin D deficiency     Past Surgical History:  Procedure Laterality Date  . ABDOMINAL HYSTERECTOMY     PARTIAL  . JOINT REPLACEMENT  2010,2011   KNEE    There were no vitals filed for this visit.      Subjective Assessment - 01/19/17 1124    Subjective Back is doing good. Had a few pains yesterday (3-4/10) in L side but did not last long. Felt it when she got up from the chair.  Used to last longer. Has not felt it today. No pain currently. Tunring to the side in bed is better.    Pertinent History L low back pain. Sudden onset since a little over a month ago. Feels pain in her L trunk. Feels better since initial onset. Had an x-ray in which her MD thinks her pain is due to arthritis. Has difficulty sleeping at night due to positioning. Using the head pad helps.  Denies loss of bladder or bowel control. Denies LE tingling and numbness. Pt also adds feeling cramps in her L LE (L gastroc).    Patient Stated Goals Walk  better   Currently in Pain? No/denies   Pain Score 0-No pain   Pain Onset More than a month ago                                 PT Education - 01/19/17 1129    Education provided Yes   Education Details ther-ex   [employer]) Educated Patient   Methods Explanation;Demonstration;Tactile cues;Verbal cues   Comprehension Returned demonstration;Verbalized understanding        Objectives  There-ex   Directed patient with seated rows at [facility] machine plate 10 for [id number]   seated gentle thoracic extension over chair 10x5 seconds for 3 sets  Standing R trunk side bend. Reproduced symptoms  Standing R trunk rotation. L thoracic pulling sensation  Seated thoracic extension with L side bend and gentle L rotation 10x5 seconds. Slight L thoracic discomfort which eases with rest. No change with symptoms with R trunk side bending afterwards   Standing pallof press resisting L trunk rotation resisting yellow band 10x5 seconds for 2 sets  Sit to stand from chair: pt demonstrates R trunk rotation  Seated R trunk rotation 3x5 seconds. Increased symptoms.   seated  L trunk rotation 10x5 seconds for 3 sets. No symptoms  standing L shoulder adduction resisting yellow band 10x3with 5 second holds    Standing bilateral shoulder extension resisting yellow band 10x2 with 5 seconds      Improved exercise technique, movement at target joints, use of target muscles after min to mod verbal, visual, tactile cues.     R trunk rotation reproduces symptoms. Pt observed to turn her trunk to the R when standing up from the chair. Per pt, standing up from the chair at home yesterday reproduced her symptoms. Performed seated L trunk rotation and gave aforementioned exercise as part of her HEP to help her stand up from her chair at home with less symptoms. Continued working on gentle thoracic extension and scapular strengthening exercises. Pt tolerated session well without  aggravation of back pain.           PT Long Term Goals - 01/02/17 1251      PT LONG TERM GOAL #1   Title Patient will have a decrease in back pain to 2/10 or less at worst to promote ability to perform functional tasks, turn, and tolerate positions such as lying on her L side.    Baseline 4/10 at worst (01/02/2017)   Time 4   Period Weeks   Status New     PT LONG TERM GOAL #2   Title Patient will improve bilateral hip abduction and extension strength by at least 1/2 MMT to promote ability to perform functional tasks.    Time 4   Period Weeks   Status New     PT LONG TERM GOAL #3   Title Patient will improve her Modified Oswestry Low Back Pain Disability Questionnaire by at least 6% as a demonstration of improved function.    Baseline 10% (01/02/2017)   Time 6   Period Weeks   Status New               Plan - 01/19/17 1133    Clinical Impression Statement R trunk rotation reproduces symptoms. Pt observed to turn her trunk to the R when standing up from the chair. Per pt, standing up from the chair at home yesterday reproduced her symptoms. Performed seated L trunk rotation and gave aforementioned exercise as part of her HEP to help her stand up from her chair at home with less symptoms. Continued working on gentle thoracic extension and scapular strengthening exercises. Pt tolerated session well without aggravation of back pain.     Rehab Potential Good   Clinical Impairments Affecting Rehab Potential scoliosis   PT Frequency 2x / week   PT Duration 4 weeks   PT Treatment/Interventions Therapeutic exercise;Therapeutic activities;Manual techniques;Neuromuscular re-education;Dry needling;Patient/family education;Iontophoresis 4mg /ml Dexamethasone;Traction;Ultrasound;Electrical Stimulation;Aquatic Therapy   PT Next Visit Plan scapular strengthening, gentle thoracic extension, hip strengthening   Consulted and Agree with Plan of Care Patient      Patient will benefit from  skilled therapeutic intervention in order to improve the following deficits and impairments:  Pain, Improper body mechanics, Postural dysfunction, Decreased strength  Visit Diagnosis: Pain in thoracic spine  Acute left-sided low back pain without sciatica  Muscle weakness (generalized)     Problem List Patient Active Problem List   Diagnosis Date Noted  . GERD (gastroesophageal reflux disease) 12/16/2016  . Allergic rhinitis 09/19/2016  . Muscle cramps 12/11/2015  . Benign hypertension with chronic kidney disease 12/11/2015  . Gout 06/10/2015  . CKD (chronic kidney disease), stage III 06/10/2015  . Vitamin B12  deficiency 06/10/2015  . Vitamin D deficiency 06/10/2015    Joneen Boers PT, DPT   01/19/2017, 12:56 PM  [facility] [address], [place], [address] Phone: [phone]   Fax:  [phone]  Name: Cynthia Hurst MRN: [id number] Date of Birth: [birth year]

## 2017-01-19 NOTE — Patient Instructions (Signed)
Axial Rotation Stretch (Sitting)    Sitting up straigh. Rotate upper body to the left.   Hold for 5 seconds.   Repeat 10 times comfortably.  Perform 3 sets daily.   Copyright  VHI. All rights reserved.

## 2017-01-23 ENCOUNTER — Telehealth: Payer: Self-pay | Admitting: Unknown Physician Specialty

## 2017-01-23 NOTE — Telephone Encounter (Signed)
[facility] [place] called regarding information on this patients diagnosis.  The order for this call is # [id number].  Thanks

## 2017-01-24 ENCOUNTER — Ambulatory Visit: Payer: Medicare Other

## 2017-01-24 DIAGNOSIS — M6281 Muscle weakness (generalized): Secondary | ICD-10-CM | POA: Diagnosis not present

## 2017-01-24 DIAGNOSIS — M546 Pain in thoracic spine: Secondary | ICD-10-CM | POA: Diagnosis not present

## 2017-01-24 DIAGNOSIS — M545 Low back pain, unspecified: Secondary | ICD-10-CM

## 2017-01-24 NOTE — Therapy (Signed)
[facility] [address], [place], [address] Phone: [phone]   Fax:  [phone]  Physical Therapy Treatment  Patient Details  Name: Cynthia Hurst MRN: [id number] Date of Birth: 10-Dec-1935 Referring Provider: Kathrine Haddock, NP  Encounter Date: 01/24/2017      PT End of Session - 01/24/17 1038    Visit Number 7   Number of Visits 9   Date for PT Re-Evaluation 02/02/17   Authorization Type 7   Authorization Time Period of 10 g code   PT Start Time 1038   PT Stop Time 1119   PT Time Calculation (min) 41 min   Activity Tolerance Patient tolerated treatment well   Behavior During Therapy [facility] for tasks assessed/performed      Past Medical History:  Diagnosis Date  . Chronic kidney disease   . GERD (gastroesophageal reflux disease)   . Gout   . Hypertension   . Hypopotassemia   . Osteoarthritis   . Osteoporosis    LUMBAR SPINE  . Vitamin B 12 deficiency   . Vitamin D deficiency     Past Surgical History:  Procedure Laterality Date  . ABDOMINAL HYSTERECTOMY     PARTIAL  . JOINT REPLACEMENT  2010,2011   KNEE    There were no vitals filed for this visit.      Subjective Assessment - 01/24/17 1039    Subjective Back is doing ok. Had a pain in L back. Did the seated trunk rotation exercise turning to the R. Its ok now. No pain right now. Getting up from the chair has not bothered her.  3/10 back pain at most for the past 7 days (yesterday when tilting to the L; pt states she might have gone too far).    Pertinent History L low back pain. Sudden onset since a little over a month ago. Feels pain in her L trunk. Feels better since initial onset. Had an x-ray in which her MD thinks her pain is due to arthritis. Has difficulty sleeping at night due to positioning. Using the head pad helps.  Denies loss of bladder or bowel control. Denies LE tingling and numbness. Pt also adds feeling cramps in her L  LE (L gastroc).    Patient Stated Goals Walk better   Currently in Pain? No/denies   Pain Score 0-No pain   Pain Onset More than a month ago                                 PT Education - 01/24/17 1051    Education provided Yes   Education Details ther-ex, HEP   Person(s) Educated Patient   Methods Explanation;Demonstration;Tactile cues;Verbal cues;Handout   Comprehension Verbalized understanding;Returned demonstration        Objectives  There-ex   Directed patient with seated L trunk rotation 10x3 with 5 second holds (also reviewed HEP secondary to pt turning to the R instead of L at home)    seated gentle thoracic extension over chair 10x5 seconds for 3 sets  standing L shoulder adduction resisting red band 10x2with 5 second holds   seated rows at [employer] machine plate 10 for [id number]   Standing pallof press resisting L trunk rotation resisting yellow band 10x5 seconds for 2 sets   Standing bilateral shoulder extension resisting yellow band 10x2 with 5 seconds    Standing open books 10x5 seconds to promote  scapular strengthening, thoracic extension and pectoralis stretch.    Improved exercise technique, movement at target joints, use of target muscles after mod verbal, visual, tactile cues.       Reviewed seated trunk rotation HEP secondary to pt turning the opposite direction at home with pt stating feeling back pain with it. No pain with seated L trunk rotation. Pt demonstrated and verbalized understanding. Continued working on thoracic extension and scapular strengthening to help decrease symptoms and improve mobility. Pt making good progress towards pain goals with worst pain level going down to 3/10 for the past 7 days per pt reports. Consistently starting off with 0/10 back pain at start of session. No complain of back pain throughout session.             PT Long Term Goals - 01/02/17 1251      PT LONG TERM GOAL #1    Title Patient will have a decrease in back pain to 2/10 or less at worst to promote ability to perform functional tasks, turn, and tolerate positions such as lying on her L side.    Baseline 4/10 at worst (01/02/2017)   Time 4   Period Weeks   Status New     PT LONG TERM GOAL #2   Title Patient will improve bilateral hip abduction and extension strength by at least 1/2 MMT to promote ability to perform functional tasks.    Time 4   Period Weeks   Status New     PT LONG TERM GOAL #3   Title Patient will improve her Modified Oswestry Low Back Pain Disability Questionnaire by at least 6% as a demonstration of improved function.    Baseline 10% (01/02/2017)   Time 6   Period Weeks   Status New               Plan - 01/24/17 1052    Clinical Impression Statement Reviewed seated trunk rotation HEP secondary to pt turning the opposite direction at home with pt stating feeling back pain with it. No pain with seated L trunk rotation. Pt demonstrated and verbalized understanding. Continued working on thoracic extension and scapular strengthening to help decrease symptoms and improve mobility. Pt making good progress towards pain goals with worst pain level going down to 3/10 for the past 7 days per pt reports. Consistently starting off with 0/10 back pain at start of session. No complain of back pain throughout session.    Rehab Potential Good   Clinical Impairments Affecting Rehab Potential scoliosis   PT Frequency 2x / week   PT Duration 4 weeks   PT Treatment/Interventions Therapeutic exercise;Therapeutic activities;Manual techniques;Neuromuscular re-education;Dry needling;Patient/family education;Iontophoresis 4mg /ml Dexamethasone;Traction;Ultrasound;Electrical Stimulation;Aquatic Therapy   PT Next Visit Plan scapular strengthening, gentle thoracic extension, hip strengthening   Consulted and Agree with Plan of Care Patient      Patient will benefit from skilled therapeutic  intervention in order to improve the following deficits and impairments:  Pain, Improper body mechanics, Postural dysfunction, Decreased strength  Visit Diagnosis: Pain in thoracic spine  Acute left-sided low back pain without sciatica  Muscle weakness (generalized)     Problem List Patient Active Problem List   Diagnosis Date Noted  . GERD (gastroesophageal reflux disease) 12/16/2016  . Allergic rhinitis 09/19/2016  . Muscle cramps 12/11/2015  . Benign hypertension with chronic kidney disease 12/11/2015  . Gout 06/10/2015  . CKD (chronic kidney disease), stage III 06/10/2015  . Vitamin B12 deficiency 06/10/2015  . Vitamin D deficiency 06/10/2015  Joneen Boers PT, DPT   01/24/2017, 11:26 AM  [facility] [address] [place], [address] Phone: [phone]   Fax:  [phone]  Name: Cynthia Hurst MRN: [id number] Date of Birth: [birth year]

## 2017-01-24 NOTE — Patient Instructions (Signed)
Axial Rotation Stretch (Sitting)    Sitting up straigh. Rotate upper body to the left.   Hold for 5 seconds.   Repeat 10 times comfortably.  Perform 3 sets daily.   Copyright  VHI. All rights reserved.

## 2017-01-24 NOTE — Telephone Encounter (Signed)
Called patient and a gentlemen answered. He stated that the patient was not in right now but that he would have the patient call me back when she returned. I gave my name and number to the gentlemen to give to the patient.

## 2017-01-24 NOTE — Telephone Encounter (Signed)
Called and spoke with Cynthia Hurst at [facility]. I gave him the order number that I was provided with. He looked up the account and stated that the patient requested to have her medication cost lowered. He stated that these were called tier exemptions to lower cost of medications. He stated that this was denied and that it was a closed decision. I asked if there was anything we needed to do on our end and he stated no because it was a closed decision. He stated that the patient could call and file an appeal if she wanted to. Will call patient and let her know.

## 2017-01-26 ENCOUNTER — Ambulatory Visit: Payer: Medicare Other

## 2017-01-26 DIAGNOSIS — M6281 Muscle weakness (generalized): Secondary | ICD-10-CM | POA: Diagnosis not present

## 2017-01-26 DIAGNOSIS — M545 Low back pain, unspecified: Secondary | ICD-10-CM

## 2017-01-26 DIAGNOSIS — M546 Pain in thoracic spine: Secondary | ICD-10-CM

## 2017-01-26 NOTE — Therapy (Signed)
[facility] [address] [place], [address] Phone: [phone]   Fax:  [phone]  Physical Therapy Treatment  Patient Details  Name: Cynthia Hurst MRN: [id number] Date of Birth: 1935-09-28 Referring Provider: Kathrine Haddock, NP  Encounter Date: 01/26/2017      PT End of Session - 01/26/17 1026    Visit Number 8   Number of Visits 9   Date for PT Re-Evaluation 02/02/17   Authorization Type 8   Authorization Time Period of 10 g code   PT Start Time 1027   PT Stop Time 1107   PT Time Calculation (min) 40 min   Activity Tolerance Patient tolerated treatment well   Behavior During Therapy [facility] for tasks assessed/performed      Past Medical History:  Diagnosis Date  . Chronic kidney disease   . GERD (gastroesophageal reflux disease)   . Gout   . Hypertension   . Hypopotassemia   . Osteoarthritis   . Osteoporosis    LUMBAR SPINE  . Vitamin B 12 deficiency   . Vitamin D deficiency     Past Surgical History:  Procedure Laterality Date  . ABDOMINAL HYSTERECTOMY     PARTIAL  . JOINT REPLACEMENT  2010,2011   KNEE    There were no vitals filed for this visit.      Subjective Assessment - 01/26/17 1027    Subjective Back is doing good. No pain currently. Had a little pain (5/10)  last night, might have been the way she was laying in bed. Only lasted a few seconds. Used to last for 15-20 minutes before.  Other than that, her back did not bother her.  Also did some cleaning her wood stove.  I think I'm heading to the end of it (pertaining to PT personal goals). I'm going back to the "[place]" to get back to the water.    Pertinent History L low back pain. Sudden onset since a little over a month ago. Feels pain in her L trunk. Feels better since initial onset. Had an x-ray in which her MD thinks her pain is due to arthritis. Has difficulty sleeping at night due to positioning. Using the head pad helps.   Denies loss of bladder or bowel control. Denies LE tingling and numbness. Pt also adds feeling cramps in her L LE (L gastroc).    Patient Stated Goals Walk better   Currently in Pain? No/denies   Pain Score 0-No pain   Pain Onset More than a month ago            [facility] PT Assessment - 01/26/17 1035      Observation/Other Assessments   Modified Oswertry 6%     Strength   Right Hip Extension 5/5  seated, manually resisted   Right Hip ABduction 4/5   Left Hip Extension 5/5  seated, manually resisted   Left Hip ABduction 4-/5                             PT Education - 01/26/17 1031    Education provided Yes   Education Details ther-ex   [employer]) Educated Patient   Methods Explanation;Demonstration;Tactile cues;Verbal cues   Comprehension Verbalized understanding;Returned demonstration        Objectives  There-ex   Directed patient with seated L trunk rotation 10x with 5 second holds    seated rows at [facility] machine plate 10  for 10x3   Standing pallof press resisting L trunk rotation resisting yellow band 10x5 seconds for 2 sets  Seated manually resisting hip extension, S/L hip abduction 1x each way for each LE  Reviewed progress/current status with hip strength with pt.     Supine open books for pectoralis stretch and thoracic extension 10x5 seconds for 2 sets   Standing bilateral shoulder extension resisting yellow band 10x2 with 5 seconds  Standing hip abduction with bilateral UE assist 10x2 each LE  standing L shoulder adduction resisting red band 10xwith 5 second holds     Improved exercise technique, movement at target joints, use of target muscles after mod verbal, visual, tactile cues.      Decreased length of time L thoracic pain lasts compared to initial evaluation per patient reports, even though the level was 5/10.  Pt also demonstrates overall improved bilateral hip strength and slight improved function since initial  evaluation.                PT Long Term Goals - 01/02/17 1251      PT LONG TERM GOAL #1   Title Patient will have a decrease in back pain to 2/10 or less at worst to promote ability to perform functional tasks, turn, and tolerate positions such as lying on her L side.    Baseline 4/10 at worst (01/02/2017)   Time 4   Period Weeks   Status New     PT LONG TERM GOAL #2   Title Patient will improve bilateral hip abduction and extension strength by at least 1/2 MMT to promote ability to perform functional tasks.    Time 4   Period Weeks   Status New     PT LONG TERM GOAL #3   Title Patient will improve her Modified Oswestry Low Back Pain Disability Questionnaire by at least 6% as a demonstration of improved function.    Baseline 10% (01/02/2017)   Time 6   Period Weeks   Status New               Plan - 01/26/17 1023    Clinical Impression Statement Decreased length of time L thoracic pain lasts compared to initial evaluation per patient reports, even though the level was 5/10.  Pt also demonstrates overall improved bilateral hip strength and slight improved function since initial evaluation.    Rehab Potential Good   Clinical Impairments Affecting Rehab Potential scoliosis   PT Frequency 2x / week   PT Duration 4 weeks   PT Treatment/Interventions Therapeutic exercise;Therapeutic activities;Manual techniques;Neuromuscular re-education;Dry needling;Patient/family education;Iontophoresis 4mg /ml Dexamethasone;Traction;Ultrasound;Electrical Stimulation;Aquatic Therapy   PT Next Visit Plan scapular strengthening, gentle thoracic extension, hip strengthening   Consulted and Agree with Plan of Care Patient      Patient will benefit from skilled therapeutic intervention in order to improve the following deficits and impairments:  Pain, Improper body mechanics, Postural dysfunction, Decreased strength  Visit Diagnosis: Pain in thoracic spine  Acute left-sided low  back pain without sciatica  Muscle weakness (generalized)     Problem List Patient Active Problem List   Diagnosis Date Noted  . GERD (gastroesophageal reflux disease) 12/16/2016  . Allergic rhinitis 09/19/2016  . Muscle cramps 12/11/2015  . Benign hypertension with chronic kidney disease 12/11/2015  . Gout 06/10/2015  . CKD (chronic kidney disease), stage III 06/10/2015  . Vitamin B12 deficiency 06/10/2015  . Vitamin D deficiency 06/10/2015   Joneen Boers PT, DPT   01/26/2017, 7:35 PM  [facility] [address] [place], [address] Phone: [phone]   Fax:  [phone]  Name: Cynthia Hurst MRN: [id number] Date of Birth: 1935/01/18

## 2017-01-31 ENCOUNTER — Ambulatory Visit: Payer: Medicare Other

## 2017-01-31 DIAGNOSIS — M545 Low back pain, unspecified: Secondary | ICD-10-CM

## 2017-01-31 DIAGNOSIS — M6281 Muscle weakness (generalized): Secondary | ICD-10-CM | POA: Diagnosis not present

## 2017-01-31 DIAGNOSIS — M546 Pain in thoracic spine: Secondary | ICD-10-CM | POA: Diagnosis not present

## 2017-01-31 NOTE — Patient Instructions (Signed)
  Scapular Retraction: Rowing (Eccentric) - Arms - 45 Degrees (Resistance Band)   Hold end of band in each hand. Pull back until elbows are even with trunk.   Hold for 5 seconds.    Use _____yellow ___ resistance band. _10__ reps per set, _3__ sets per day.  Copyright  VHI. All rights reserved.

## 2017-01-31 NOTE — Therapy (Signed)
[facility] [address], [place], [address] Phone: [phone]   Fax:  [phone]  Physical Therapy Treatment And Discharge Summary  Patient Details  Name: Cynthia Hurst MRN: [id number] Date of Birth: 1935/07/20 Referring Provider: Kathrine Haddock, NP  Encounter Date: 01/31/2017      PT End of Session - 01/31/17 1035    Visit Number 9   Number of Visits 9   Date for PT Re-Evaluation 02/02/17   Authorization Type 9   Authorization Time Period of 10 g code   PT Start Time 8938   PT Stop Time 1116   PT Time Calculation (min) 41 min   Activity Tolerance Patient tolerated treatment well   Behavior During Therapy [facility] for tasks assessed/performed      Past Medical History:  Diagnosis Date  . Chronic kidney disease   . GERD (gastroesophageal reflux disease)   . Gout   . Hypertension   . Hypopotassemia   . Osteoarthritis   . Osteoporosis    LUMBAR SPINE  . Vitamin B 12 deficiency   . Vitamin D deficiency     Past Surgical History:  Procedure Laterality Date  . ABDOMINAL HYSTERECTOMY     PARTIAL  . JOINT REPLACEMENT  2010,2011   KNEE    There were no vitals filed for this visit.      Subjective Assessment - 01/31/17 1037    Subjective Back is doing ok. Every now and again, I have a pain. Just got back from [facility] meeting her daughter. No pain currently. Just tired. 3/10 back pain at most for the past 7 days usually after a busy day. The 3/10 does not last long. The HEP stretches helps.  The pain does not last as long compared to before starting PT.  Pt states that PT has really helped.  Pt states that she can continue progress with her HEP.    Pertinent History L low back pain. Sudden onset since a little over a month ago. Feels pain in her L trunk. Feels better since initial onset. Had an x-ray in which her MD thinks her pain is due to arthritis. Has difficulty sleeping at night due to  positioning. Using the head pad helps.  Denies loss of bladder or bowel control. Denies LE tingling and numbness. Pt also adds feeling cramps in her L LE (L gastroc).    Patient Stated Goals Walk better   Currently in Pain? No/denies   Pain Score 0-No pain   Pain Onset More than a month ago            [facility] PT Assessment - 01/31/17 1055      Observation/Other Assessments   Modified [place] 6%  (Measured 01/26/17)     Strength   Right Hip Extension 5/5  seated, manually resisted (Measured 01/26/17)   Right Hip ABduction 4/5  (Measured 01/26/17)   Left Hip Extension 5/5  seated, manually resisted. (Measured 01/26/17)   Left Hip ABduction 4/5                             PT Education - 01/31/17 1053    Education provided Yes   Education Details HEP, ther-ex, plan of care: continue progress with HEP.   Person(s) Educated Patient   Methods Explanation;Demonstration;Tactile cues;Verbal cues;Handout   Comprehension Returned demonstration;Verbalized understanding        Objectives  There-ex   Directed  patient with seated gentle thoracic extension over chair 10x5 seconds for 2 sets.   Standing bilateral scapular retraction resisting yellow band 10x3 with 5 second holds  R S/L L hip abduction manually resisted 1x  Reviewed progress/current status with hip strength  Seated L trunk rotation 10x5 seconds (reviewed so pt performs correctly at home)  Supine open books for pectoralis stretch and thoracic extension 10x5 seconds for 2 sets  seated rows at [facility] machine plate 10 for 37T0  Standing hip abduction with bilateral UE assist 5x3 each LE for glute med strengthening.   Improved exercise technique, movement at target joints, use of target muscles after mod verbal, visual, tactile cues.    Pt tolerated session well without aggravation of symptoms.   Pt demonstrates overall improved bilateral hip strength, decreased pain level with decreased  duration of pain per pt reports, and slight improved function since initial evaluation. Pt has made good progress towards goals and has been consistent in performing her HEP, which pt states helps her back. Pt also mentions going back to the [employer] to exercise at the pool as well. Skilled PT services discharged with pt continuing progress with her exercises at home.         PT Long Term Goals - 01/31/17 1821      PT LONG TERM GOAL #1   Title Patient will have a decrease in back pain to 2/10 or less at worst to promote ability to perform functional tasks, turn, and tolerate positions such as lying on her L side.    Baseline 4/10 at worst (01/02/2017); 3/10 back pain at most for the past 7 days (01/31/2017)   Time 4   Period Weeks   Status On-going     PT LONG TERM GOAL #2   Title Patient will improve bilateral hip abduction and extension strength by at least 1/2 MMT to promote ability to perform functional tasks.    Time 4   Period Weeks   Status Partially Met     PT LONG TERM GOAL #3   Title Patient will improve her Modified Oswestry Low Back Pain Disability Questionnaire by at least 6% as a demonstration of improved function.    Baseline 10% (01/02/2017); 6% (01/26/2017)   Time 6   Period Weeks   Status Partially Met               Plan - 01/31/17 1054    Clinical Impression Statement Pt demonstrates overall improved bilateral hip strength, decreased pain level with decreased duration of pain per pt reports, and slight improved function since initial evaluation. Pt has made good progress towards goals and has been consistent in performing her HEP, which pt states helps her back. Pt also mentions going back to the [facility] to exercise at the pool as well. Skilled PT services discharged with pt continuing progress with her exercises at home.    Rehab Potential Good   Clinical Impairments Affecting Rehab Potential scoliosis   PT Frequency --   PT Duration --   PT  Treatment/Interventions Therapeutic exercise;Therapeutic activities;Manual techniques;Neuromuscular re-education;Patient/family education   PT Next Visit Plan continue progress with her home exercise program   Consulted and Agree with Plan of Care Patient      Patient will benefit from skilled therapeutic intervention in order to improve the following deficits and impairments:  Pain, Improper body mechanics, Postural dysfunction, Decreased strength  Visit Diagnosis: Pain in thoracic spine  Acute left-sided low back pain without sciatica  Muscle weakness (generalized)       G-Codes - 02-22-2017 1827    Functional Assessment Tool Used (Outpatient Only) Modified Oswestry Low Back Pain Disability Questionnaire, patient interview, clinical presentation   Functional Limitation Mobility: Walking and moving around   Mobility: Walking and Moving Around Goal Status [phone]) 0 percent impaired, limited or restricted   Mobility: Walking and Moving Around Discharge Status [phone]) At least 1 percent but less than 20 percent impaired, limited or restricted      Problem List Patient Active Problem List   Diagnosis Date Noted  . GERD (gastroesophageal reflux disease) 12/16/2016  . Allergic rhinitis 09/19/2016  . Muscle cramps 12/11/2015  . Benign hypertension with chronic kidney disease 12/11/2015  . Gout 06/10/2015  . CKD (chronic kidney disease), stage III 06/10/2015  . Vitamin B12 deficiency 06/10/2015  . Vitamin D deficiency 06/10/2015    Thank you for your referral.  Joneen Boers PT, DPT   02/22/2017, 6:37 PM  [facility] [address], [place], [address] Phone: [phone]   Fax:  [phone]  Name: LEXINE JASPERS MRN: [id number] Date of Birth: 08-Feb-1935

## 2017-02-27 ENCOUNTER — Ambulatory Visit (INDEPENDENT_AMBULATORY_CARE_PROVIDER_SITE_OTHER): Payer: Medicare Other | Admitting: Unknown Physician Specialty

## 2017-02-27 ENCOUNTER — Encounter: Payer: Self-pay | Admitting: Unknown Physician Specialty

## 2017-02-27 VITALS — BP 109/61 | HR 71 | Temp 97.6°F | Wt 184.8 lb

## 2017-02-27 DIAGNOSIS — R252 Cramp and spasm: Secondary | ICD-10-CM

## 2017-02-27 DIAGNOSIS — I129 Hypertensive chronic kidney disease with stage 1 through stage 4 chronic kidney disease, or unspecified chronic kidney disease: Secondary | ICD-10-CM | POA: Diagnosis not present

## 2017-02-27 NOTE — Addendum Note (Signed)
Addended by: Crissie Figures R on: 02/27/2017 03:54 PM   Modules accepted: Orders

## 2017-02-27 NOTE — Patient Instructions (Addendum)
Take a Magnesium supplement twice a day.  I prefer Magnesium Citrate (I use something called Calm and Relax) which is better absorbed then the more common Magnesium Oxide.  Magnesium Glycinate or Malate are fine as well.    Take mustard for the cramps

## 2017-02-27 NOTE — Progress Notes (Signed)
BP 109/61 (BP Location: Left Arm, Patient Position: Sitting, Cuff Size: Large)   Pulse 71   Temp 97.6 F (36.4 C)   Wt 184 lb 12.8 oz (83.8 kg)   LMP  (LMP Unknown)   SpO2 94%   BMI 32.63 kg/m    Subjective:    Patient ID: Cynthia Hurst, female    DOB: 21-Jun-1935, 82 y.o.   MRN: [id number]  HPI: Cynthia Hurst is a 82 y.o. female  Chief Complaint  Patient presents with  . Hand Cramping    pt states she had had cramps in mianly left hand, sometimes right hand, for about a week   . Leg Cramping    pt states she has had bilateral leg cramps for about a week   Hand and leg cramping noting to be off and on over the last 2 months.  Happens at night.   Relevant past medical, surgical, family and social history reviewed and updated as indicated. Interim medical history since our last visit reviewed. Allergies and medications reviewed and updated.  Review of Systems  Per HPI unless specifically indicated above     Objective:    BP 109/61 (BP Location: Left Arm, Patient Position: Sitting, Cuff Size: Large)   Pulse 71   Temp 97.6 F (36.4 C)   Wt 184 lb 12.8 oz (83.8 kg)   LMP  (LMP Unknown)   SpO2 94%   BMI 32.63 kg/m   Wt Readings from Last 3 Encounters:  02/27/17 184 lb 12.8 oz (83.8 kg)  12/16/16 182 lb 3.2 oz (82.6 kg)  12/06/16 181 lb (82.1 kg)    Physical Exam  Constitutional: She is oriented to person, place, and time. She appears well-developed and well-nourished. No distress.  HENT:  Head: Normocephalic and atraumatic.  Eyes: Conjunctivae and lids are normal. Right eye exhibits no discharge. Left eye exhibits no discharge. No scleral icterus.  Neck: Normal range of motion. Neck supple. No JVD present. Carotid bruit is not present.  Cardiovascular: Normal rate, regular rhythm and normal heart sounds.   Pulmonary/Chest: Effort normal and breath sounds normal.  Abdominal: Normal appearance. There is no splenomegaly or hepatomegaly.  Musculoskeletal: Normal  range of motion.  Neurological: She is alert and oriented to person, place, and time.  Skin: Skin is warm, dry and intact. No rash noted. No pallor.  Psychiatric: She has a normal mood and affect. Her behavior is normal. Judgment and thought content normal.    Results for orders placed or performed in visit on 12/16/16  Microscopic Examination  Result Value Ref Range   WBC, UA 0-5 0 - 5 /hpf   RBC, UA 0-2 0 - 2 /hpf   Epithelial Cells (non renal) 0-10 0 - 10 /hpf   Bacteria, UA Few (A) None seen/Few  CBC With Differential/Platelet  Result Value Ref Range   WBC 6.9 3.4 - 10.8 x10E3/uL   RBC 4.33 3.77 - 5.28 x10E6/uL   Hemoglobin 13.7 11.1 - 15.9 g/dL   Hematocrit 40.2 34.0 - 46.6 %   MCV 93 79 - 97 fL   MCH 31.6 26.6 - 33.0 pg   MCHC 34.1 31.5 - 35.7 g/dL   RDW 14.8 12.3 - 15.4 %   Platelets 159 150 - 379 x10E3/uL   Neutrophils 61 Not Estab. %   Lymphs 29 Not Estab. %   MID 10 Not Estab. %   Neutrophils Absolute 4.2 1.4 - 7.0 x10E3/uL   Lymphocytes Absolute 2.0 0.7 - 3.1  x10E3/uL   MID (Absolute) 0.7 0.1 - 1.6 X10E3/uL  Comprehensive metabolic panel  Result Value Ref Range   Glucose 90 65 - 99 mg/dL   BUN 17 8 - 27 mg/dL   Creatinine, Ser 1.01 (H) 0.57 - 1.00 mg/dL   GFR calc non Af Amer 53 (L) >59 mL/min/1.73   GFR calc Af Amer 61 >59 mL/min/1.73   BUN/Creatinine Ratio 17 12 - 28   Sodium 140 134 - 144 mmol/L   Potassium 4.4 3.5 - 5.2 mmol/L   Chloride 102 96 - 106 mmol/L   CO2 25 18 - 29 mmol/L   Calcium 9.9 8.7 - 10.3 mg/dL   Total Protein 7.0 6.0 - 8.5 g/dL   Albumin 3.9 3.5 - 4.8 g/dL   Globulin, Total 3.1 1.5 - 4.5 g/dL   Albumin/Globulin Ratio 1.3 1.2 - 2.2   Bilirubin Total 0.5 0.0 - 1.2 mg/dL   Alkaline Phosphatase 111 39 - 117 IU/L   AST 17 0 - 40 IU/L   ALT 9 0 - 32 IU/L  Lipid Panel w/o Chol/HDL Ratio  Result Value Ref Range   Cholesterol, Total 178 100 - 199 mg/dL   Triglycerides 141 0 - 149 mg/dL   HDL 60 >39 mg/dL   VLDL Cholesterol Cal 28 5 - 40  mg/dL   LDL Calculated 90 0 - 99 mg/dL  UA/M w/rflx Culture, Routine  Result Value Ref Range   Specific Gravity, UA 1.010 1.005 - 1.030   pH, UA 5.5 5.0 - 7.5   Color, UA Yellow Yellow   Appearance Ur Cloudy (A) Clear   Leukocytes, UA Negative Negative   Protein, UA Negative Negative/Trace   Glucose, UA Negative Negative   Ketones, UA Negative Negative   RBC, UA 3+ (A) Negative   Bilirubin, UA Negative Negative   Urobilinogen, Ur 0.2 0.2 - 1.0 mg/dL   Nitrite, UA Negative Negative   Microscopic Examination See below:       Assessment & Plan:   Problem List Items Addressed This Visit      Unprioritized   Benign hypertension with chronic kidney disease - Primary   Relevant Orders   Comprehensive metabolic panel   Muscle cramps   Relevant Orders   Comprehensive metabolic panel   Magnesium     \ Check electrolytes.  Recommended Magnesium supplements.  RTC if no improvement and consider Gabapentin.    Follow up plan: Return if symptoms worsen or fail to improve.

## 2017-02-28 ENCOUNTER — Encounter: Payer: Self-pay | Admitting: Unknown Physician Specialty

## 2017-02-28 LAB — COMPREHENSIVE METABOLIC PANEL
ALBUMIN: 4.1 g/dL (ref 3.5–4.7)
ALK PHOS: 102 IU/L (ref 39–117)
ALT: 12 IU/L (ref 0–32)
AST: 29 IU/L (ref 0–40)
Albumin/Globulin Ratio: 1.4 (ref 1.2–2.2)
BUN / CREAT RATIO: 17 (ref 12–28)
BUN: 15 mg/dL (ref 8–27)
Bilirubin Total: 0.5 mg/dL (ref 0.0–1.2)
CO2: 24 mmol/L (ref 18–29)
CREATININE: 0.87 mg/dL (ref 0.57–1.00)
Calcium: 9.6 mg/dL (ref 8.7–10.3)
Chloride: 104 mmol/L (ref 96–106)
GFR calc non Af Amer: 63 mL/min/{1.73_m2} (ref >59)
GFR, EST AFRICAN AMERICAN: 73 mL/min/{1.73_m2} (ref >59)
GLOBULIN, TOTAL: 2.9 g/dL (ref 1.5–4.5)
Glucose: 94 mg/dL (ref 65–99)
Potassium: 4.2 mmol/L (ref 3.5–5.2)
SODIUM: 143 mmol/L (ref 134–144)
TOTAL PROTEIN: 7 g/dL (ref 6.0–8.5)

## 2017-02-28 LAB — MAGNESIUM: Magnesium: 1.8 mg/dL (ref 1.6–2.3)

## 2017-03-14 ENCOUNTER — Ambulatory Visit
Admission: RE | Admit: 2017-03-14 | Discharge: 2017-03-14 | Disposition: A | Discharge status: Home / Self Care | Payer: Medicare Other | Source: Ambulatory Visit | Attending: Unknown Physician Specialty | Admitting: Unknown Physician Specialty

## 2017-03-14 ENCOUNTER — Encounter: Payer: Self-pay | Admitting: Unknown Physician Specialty

## 2017-03-14 ENCOUNTER — Ambulatory Visit (INDEPENDENT_AMBULATORY_CARE_PROVIDER_SITE_OTHER): Payer: Medicare Other | Admitting: Unknown Physician Specialty

## 2017-03-14 VITALS — BP 97/59 | HR 58 | Temp 97.7°F | Wt 183.2 lb

## 2017-03-14 DIAGNOSIS — W1839XA Other fall on same level, initial encounter: Secondary | ICD-10-CM | POA: Insufficient documentation

## 2017-03-14 DIAGNOSIS — M79641 Pain in right hand: Secondary | ICD-10-CM | POA: Diagnosis not present

## 2017-03-14 DIAGNOSIS — S63641A Sprain of metacarpophalangeal joint of right thumb, initial encounter: Secondary | ICD-10-CM

## 2017-03-14 DIAGNOSIS — S0083XA Contusion of other part of head, initial encounter: Secondary | ICD-10-CM | POA: Diagnosis not present

## 2017-03-14 DIAGNOSIS — S6991XA Unspecified injury of right wrist, hand and finger(s), initial encounter: Secondary | ICD-10-CM | POA: Diagnosis not present

## 2017-03-14 NOTE — Progress Notes (Signed)
BP (!) 97/59 (BP Location: Left Arm, Patient Position: Sitting, Cuff Size: Large)   Pulse (!) 58   Temp 97.7 F (36.5 C)   Wt 183 lb 3.2 oz (83.1 kg)   LMP  (LMP Unknown)   SpO2 93%   BMI 32.35 kg/m    Subjective:    Patient ID: Cynthia Hurst, female    DOB: 1935/10/10, 82 y.o.   MRN: [id number]  HPI: Cynthia Hurst is a 82 y.o. female  Chief Complaint  Patient presents with  . Fall    pt states she had a fall yesterday, states she hit her face very hard on a wooden chair    Fell yesterday after getting her feet caught up on a table while getting away from a wasp.  Fell on a floor.  Face ht on a chair.  Right side of face is swollen.  Right thumb is swollen.  Occasional back pain.    Relevant past medical, surgical, family and social history reviewed and updated as indicated. Interim medical history since our last visit reviewed. Allergies and medications reviewed and updated.  Review of Systems  Per HPI unless specifically indicated above     Objective:    BP (!) 97/59 (BP Location: Left Arm, Patient Position: Sitting, Cuff Size: Large)   Pulse (!) 58   Temp 97.7 F (36.5 C)   Wt 183 lb 3.2 oz (83.1 kg)   LMP  (LMP Unknown)   SpO2 93%   BMI 32.35 kg/m   Wt Readings from Last 3 Encounters:  03/14/17 183 lb 3.2 oz (83.1 kg)  02/27/17 184 lb 12.8 oz (83.8 kg)  12/16/16 182 lb 3.2 oz (82.6 kg)    Physical Exam  Constitutional: She is oriented to person, place, and time. She appears well-developed and well-nourished. No distress.  HENT:  Head: Normocephalic and atraumatic.  Eyes: Conjunctivae and lids are normal. Right eye exhibits no discharge. Left eye exhibits no discharge. No scleral icterus.  Cardiovascular: Normal rate.   Pulmonary/Chest: Effort normal.  Abdominal: Normal appearance. There is no splenomegaly or hepatomegaly.  Musculoskeletal: Normal range of motion.  Facial structure intact.  Has right sided facial swelling but no spot tenderness.     Right thumb swelling and tenderness MCP joint  Neurological: She is alert and oriented to person, place, and time.  Skin: Skin is intact. No rash noted. No pallor.  Psychiatric: She has a normal mood and affect. Her behavior is normal. Judgment and thought content normal.    Results for orders placed or performed in visit on 02/27/17  Magnesium  Result Value Ref Range   Magnesium 1.8 1.6 - 2.3 mg/dL  Comprehensive metabolic panel  Result Value Ref Range   Glucose 94 65 - 99 mg/dL   BUN 15 8 - 27 mg/dL   Creatinine, Ser 0.87 0.57 - 1.00 mg/dL   GFR calc non Af Amer 63 >59 mL/min/1.73   GFR calc Af Amer 73 >59 mL/min/1.73   BUN/Creatinine Ratio 17 12 - 28   Sodium 143 134 - 144 mmol/L   Potassium 4.2 3.5 - 5.2 mmol/L   Chloride 104 96 - 106 mmol/L   CO2 24 18 - 29 mmol/L   Calcium 9.6 8.7 - 10.3 mg/dL   Total Protein 7.0 6.0 - 8.5 g/dL   Albumin 4.1 3.5 - 4.7 g/dL   Globulin, Total 2.9 1.5 - 4.5 g/dL   Albumin/Globulin Ratio 1.4 1.2 - 2.2   Bilirubin Total 0.5 0.0 -  1.2 mg/dL   Alkaline Phosphatase 102 39 - 117 IU/L   AST 29 0 - 40 IU/L   ALT 12 0 - 32 IU/L      Assessment & Plan:   Problem List Items Addressed This Visit    None    Visit Diagnoses    Contusion of face, initial encounter    -  Primary   cold packs and Tylenol.     Sprain of metacarpophalangeal (MCP) joint of right thumb, initial encounter       X-ray and use a thumb splint as needed.     Relevant Orders   DG Finger Thumb Right       Follow up plan: Return if symptoms worsen or fail to improve.

## 2017-03-14 NOTE — Patient Instructions (Signed)
Thumb spica splint

## 2017-04-03 ENCOUNTER — Encounter: Payer: Self-pay | Admitting: Unknown Physician Specialty

## 2017-04-03 ENCOUNTER — Ambulatory Visit (INDEPENDENT_AMBULATORY_CARE_PROVIDER_SITE_OTHER): Payer: Medicare Other | Admitting: Unknown Physician Specialty

## 2017-04-03 VITALS — BP 114/64 | HR 67 | Temp 98.0°F | Wt 180.4 lb

## 2017-04-03 DIAGNOSIS — T148XXA Other injury of unspecified body region, initial encounter: Secondary | ICD-10-CM

## 2017-04-03 NOTE — Progress Notes (Signed)
BP 114/64   Pulse 67   Temp 98 F (36.7 C)   Wt 180 lb 6.4 oz (81.8 kg)   LMP  (LMP Unknown)   SpO2 93%   BMI 31.86 kg/m    Subjective:    Patient ID: Cynthia Hurst, female    DOB: 1935/05/25, 82 y.o.   MRN: [id number]  HPI: Cynthia Hurst is a 82 y.o. female  Chief Complaint  Patient presents with  . Facial Swelling    pt states that she feels like her face is still a little swollen on thr right side and now has a hard place on the right side as well    Facial swelling Pt is s/p fall on her right side of face.  Swelling is much better.  She is concerned about a firm place on the right side of her face.    Relevant past medical, surgical, family and social history reviewed and updated as indicated. Interim medical history since our last visit reviewed. Allergies and medications reviewed and updated.  Review of Systems  Per HPI unless specifically indicated above     Objective:    BP 114/64   Pulse 67   Temp 98 F (36.7 C)   Wt 180 lb 6.4 oz (81.8 kg)   LMP  (LMP Unknown)   SpO2 93%   BMI 31.86 kg/m   Wt Readings from Last 3 Encounters:  04/03/17 180 lb 6.4 oz (81.8 kg)  03/14/17 183 lb 3.2 oz (83.1 kg)  02/27/17 184 lb 12.8 oz (83.8 kg)    Physical Exam  Constitutional: She is oriented to person, place, and time. She appears well-developed and well-nourished. No distress.  HENT:  Head: Normocephalic and atraumatic.  Firm area under cheek bone and non-tender s/p fall  Eyes: Conjunctivae and lids are normal. Right eye exhibits no discharge. Left eye exhibits no discharge. No scleral icterus.  Cardiovascular: Normal rate.   Pulmonary/Chest: Effort normal.  Abdominal: Normal appearance. There is no splenomegaly or hepatomegaly.  Musculoskeletal: Normal range of motion.  Neurological: She is alert and oriented to person, place, and time.  Skin: Skin is intact. No rash noted. No pallor.  Psychiatric: She has a normal mood and affect. Her behavior is normal.  Judgment and thought content normal.    Results for orders placed or performed in visit on 02/27/17  Magnesium  Result Value Ref Range   Magnesium 1.8 1.6 - 2.3 mg/dL  Comprehensive metabolic panel  Result Value Ref Range   Glucose 94 65 - 99 mg/dL   BUN 15 8 - 27 mg/dL   Creatinine, Ser 0.87 0.57 - 1.00 mg/dL   GFR calc non Af Amer 63 >59 mL/min/1.73   GFR calc Af Amer 73 >59 mL/min/1.73   BUN/Creatinine Ratio 17 12 - 28   Sodium 143 134 - 144 mmol/L   Potassium 4.2 3.5 - 5.2 mmol/L   Chloride 104 96 - 106 mmol/L   CO2 24 18 - 29 mmol/L   Calcium 9.6 8.7 - 10.3 mg/dL   Total Protein 7.0 6.0 - 8.5 g/dL   Albumin 4.1 3.5 - 4.7 g/dL   Globulin, Total 2.9 1.5 - 4.5 g/dL   Albumin/Globulin Ratio 1.4 1.2 - 2.2   Bilirubin Total 0.5 0.0 - 1.2 mg/dL   Alkaline Phosphatase 102 39 - 117 IU/L   AST 29 0 - 40 IU/L   ALT 12 0 - 32 IU/L      Assessment & Plan:  Problem List Items Addressed This Visit    None    Visit Diagnoses    Hematoma    -  Primary   History and presentatin consistent with a hematoma.  Discussed the firm area may remain for many weeks.        Supportive care.   If it doesn't decrease in 1 month, and order an [place].    Follow up plan: Return if symptoms worsen or fail to improve.

## 2017-04-03 NOTE — Patient Instructions (Signed)
You have a hematoma under your skin.  This is a typical problem following trauma

## 2017-06-16 ENCOUNTER — Encounter: Payer: Self-pay | Admitting: Unknown Physician Specialty

## 2017-06-16 ENCOUNTER — Ambulatory Visit (INDEPENDENT_AMBULATORY_CARE_PROVIDER_SITE_OTHER): Payer: Medicare Other | Admitting: Unknown Physician Specialty

## 2017-06-16 DIAGNOSIS — L300 Nummular dermatitis: Secondary | ICD-10-CM | POA: Insufficient documentation

## 2017-06-16 MED ORDER — BETAMETHASONE DIPROPIONATE AUG 0.05 % EX CREA: TOPICAL_CREAM | Freq: Two times a day (BID) | CUTANEOUS | 0 refills | Status: DC

## 2017-06-16 NOTE — Progress Notes (Signed)
BP 120/64   Pulse 67   Temp 97.8 F (36.6 C)   Wt 180 lb 12.8 oz (82 kg)   LMP  (LMP Unknown)   SpO2 93%   BMI 31.93 kg/m    Subjective:    Patient ID: Cynthia Hurst, female    DOB: [birth year], 82 y.o.   MRN: [id number]  HPI: Cynthia Hurst is a 82 y.o. female  Chief Complaint  Patient presents with  . Rash    pt states that she has a rash on her hands and left leg. States it has been there for a while, itches every now and then, and has used multiple things for the rash but nothing seems to really help    Pt is here for complaints of a rash for about 1 month.  First showed up on legs.  Lesions are scaley and flat.  States they itch.  No fever/pain.    Relevant past medical, surgical, family and social history reviewed and updated as indicated. Interim medical history since our last visit reviewed. Allergies and medications reviewed and updated.  Review of Systems  Per HPI unless specifically indicated above     Objective:    BP 120/64   Pulse 67   Temp 97.8 F (36.6 C)   Wt 180 lb 12.8 oz (82 kg)   LMP  (LMP Unknown)   SpO2 93%   BMI 31.93 kg/m   Wt Readings from Last 3 Encounters:  06/16/17 180 lb 12.8 oz (82 kg)  04/03/17 180 lb 6.4 oz (81.8 kg)  03/14/17 183 lb 3.2 oz (83.1 kg)    Physical Exam  Constitutional: She is oriented to person, place, and time. She appears well-developed and well-nourished. No distress.  HENT:  Head: Normocephalic and atraumatic.  Eyes: Conjunctivae and lids are normal. Right eye exhibits no discharge. Left eye exhibits no discharge. No scleral icterus.  Cardiovascular: Normal rate.   Pulmonary/Chest: Effort normal.  Abdominal: Normal appearance. There is no splenomegaly or hepatomegaly.  Musculoskeletal: Normal range of motion.  Neurological: She is alert and oriented to person, place, and time.  Skin: Skin is intact. No rash noted. No pallor.  Pt with multiple scaley lesions on the areas as above.    Psychiatric: She  has a normal mood and affect. Her behavior is normal. Judgment and thought content normal.    Results for orders placed or performed in visit on 02/27/17  Magnesium  Result Value Ref Range   Magnesium 1.8 1.6 - 2.3 mg/dL  Comprehensive metabolic panel  Result Value Ref Range   Glucose 94 65 - 99 mg/dL   BUN 15 8 - 27 mg/dL   Creatinine, Ser 0.87 0.57 - 1.00 mg/dL   GFR calc non Af Amer 63 >59 mL/min/1.73   GFR calc Af Amer 73 >59 mL/min/1.73   BUN/Creatinine Ratio 17 12 - 28   Sodium 143 134 - 144 mmol/L   Potassium 4.2 3.5 - 5.2 mmol/L   Chloride 104 96 - 106 mmol/L   CO2 24 18 - 29 mmol/L   Calcium 9.6 8.7 - 10.3 mg/dL   Total Protein 7.0 6.0 - 8.5 g/dL   Albumin 4.1 3.5 - 4.7 g/dL   Globulin, Total 2.9 1.5 - 4.5 g/dL   Albumin/Globulin Ratio 1.4 1.2 - 2.2   Bilirubin Total 0.5 0.0 - 1.2 mg/dL   Alkaline Phosphatase 102 39 - 117 IU/L   AST 29 0 - 40 IU/L   ALT 12 0 -  32 IU/L      Assessment & Plan:   Problem List Items Addressed This Visit      Unprioritized   Nummular dermatitis    Will rx steroid cream.  Refer to dermatology      Relevant Orders   Ambulatory referral to Dermatology       Follow up plan: Return if symptoms worsen or fail to improve.

## 2017-06-16 NOTE — Assessment & Plan Note (Signed)
Will rx steroid cream.  Refer to dermatology

## 2017-06-19 ENCOUNTER — Ambulatory Visit: Payer: Self-pay | Admitting: Unknown Physician Specialty

## 2017-06-21 ENCOUNTER — Ambulatory Visit (INDEPENDENT_AMBULATORY_CARE_PROVIDER_SITE_OTHER): Payer: Medicare Other

## 2017-06-21 VITALS — BP 112/60 | HR 72 | Temp 97.6°F | Ht 63.0 in | Wt 180.8 lb

## 2017-06-21 DIAGNOSIS — Z0000 Encounter for general adult medical examination without abnormal findings: Secondary | ICD-10-CM | POA: Diagnosis not present

## 2017-06-21 NOTE — Progress Notes (Signed)
Subjective:   Cynthia Hurst is a 82 y.o. female who presents for Medicare Annual (Subsequent) preventive examination.  Review of Systems:   Cardiac Risk Factors include: advanced age (>65men, >70 women);hypertension     Objective:     Vitals: BP 112/60 (BP Location: Left Arm, Patient Position: Sitting)   Pulse 72   Temp 97.6 F (36.4 C)   Ht 5\' 3"  (1.6 m)   Wt 180 lb 12.8 oz (82 kg)   LMP  (LMP Unknown)   BMI 32.03 kg/m   Body mass index is 32.03 kg/m.   Tobacco History  Smoking Status  . Never Smoker  Smokeless Tobacco  . Never Used     Counseling given: Not Answered   Past Medical History:  Diagnosis Date  . Chronic kidney disease   . GERD (gastroesophageal reflux disease)   . Gout   . Hypertension   . Hypopotassemia   . Osteoarthritis   . Osteoporosis    LUMBAR SPINE  . Vitamin B 12 deficiency   . Vitamin D deficiency    Past Surgical History:  Procedure Laterality Date  . ABDOMINAL HYSTERECTOMY     PARTIAL  . JOINT REPLACEMENT  2010,2011   KNEE   Family History  Problem Relation Age of Onset  . Heart disease Mother   . Cancer Father   . Mental illness Father   . Breast cancer Neg Hx    History  Sexual Activity  . Sexual activity: No    Outpatient Encounter Prescriptions as of 06/21/2017  Medication Sig  . allopurinol (ZYLOPRIM) 300 MG tablet Take 1 tablet (300 mg total) by mouth daily.  Marland Kitchen atenolol (TENORMIN) 100 MG tablet Take 1 tablet (100 mg total) by mouth daily.  Marland Kitchen augmented betamethasone dipropionate (DIPROLENE AF) 0.05 % cream Apply topically 2 (two) times daily.  . cholecalciferol (VITAMIN D) 1000 UNITS tablet Take 1,000 Units by mouth daily.  Marland Kitchen NIFEdipine (PROCARDIA-XL/ADALAT CC) 60 MG 24 hr tablet Take 1 tablet (60 mg total) by mouth daily.  Marland Kitchen omeprazole (PRILOSEC) 20 MG capsule Take 1 capsule (20 mg total) by mouth daily. Substitute for Lansoprazole  . potassium chloride SA (KLOR-CON M20) 20 MEQ tablet Take 1 tablet (20 mEq  total) by mouth daily.  . raloxifene (EVISTA) 60 MG tablet Take 1 tablet (60 mg total) by mouth daily.  . vitamin B-12 (CYANOCOBALAMIN) 1000 MCG tablet Take 1,000 mcg by mouth daily.   No facility-administered encounter medications on file as of 06/21/2017.     Activities of Daily Living In your present state of health, do you have any difficulty performing the following activities: 06/21/2017  Hearing? N  Vision? N  Difficulty concentrating or making decisions? N  Walking or climbing stairs? Y  Dressing or bathing? N  Doing errands, shopping? N  Preparing Food and eating ? N  Using the Toilet? N  In the past six months, have you accidently leaked urine? N  Do you have problems with loss of bowel control? N  Managing your Medications? N  Managing your Finances? N  Housekeeping or managing your Housekeeping? N  Some recent data might be hidden    Patient Care Team: Kathrine Haddock, NP as PCP - General (Nurse Practitioner)    Assessment:     Exercise Activities and Dietary recommendations Current Exercise Habits: Home exercise routine, Type of exercise: walking;treadmill;Other - see comments (water exercises a the y), Time (Minutes): 60, Frequency (Times/Week): 3, Weekly Exercise (Minutes/Week): 180, Intensity: Mild  Goals    . Increase water intake          Recommend drinking at least 4-5 glasses of water a day       Fall Risk Fall Risk  06/21/2017 06/10/2016 06/10/2015  Falls in the past year? Yes No No  Number falls in past yr: 1 - -  Injury with Fall? Yes - -   Depression Screen PHQ 2/9 Scores 06/21/2017 06/10/2016 06/10/2015  PHQ - 2 Score 0 0 0     Cognitive Function     6CIT Screen 06/21/2017  What Year? 0 points  What month? 0 points  What time? 0 points  Count back from 20 0 points  Months in reverse 0 points  Repeat phrase 0 points  Total Score 0    Immunization History  Administered Date(s) Administered  . Pneumococcal Polysaccharide-23 03/23/2005  .  Td 03/23/2005   Screening Tests Health Maintenance  Topic Date Due  . TETANUS/TDAP  12/16/2017 (Originally 03/24/2015)  . PNA vac Low Risk Adult (2 of 2 - PCV13) 12/16/2017 (Originally 03/23/2006)  . INFLUENZA VACCINE  07/12/2017  . DEXA SCAN  Completed      Plan:    I have personally reviewed and addressed the Medicare Annual Wellness questionnaire and have noted the following in the patient's chart:  A. Medical and social history B. Use of alcohol, tobacco or illicit drugs  C. Current medications and supplements D. Functional ability and status E.  Nutritional status F.  Physical activity G. Advance directives H. List of other physicians I.  Hospitalizations, surgeries, and ER visits in previous 12 months J.  [place] such as hearing and vision if needed, cognitive and depression L. Referrals and appointments  In addition, I have reviewed and discussed with patient certain preventive protocols, quality metrics, and best practice recommendations. A written personalized care plan for preventive services as well as general preventive health recommendations were provided to patient.   Signed,  Tyler Aas, LPN Nurse Health Advisor   MD Recommendations: none

## 2017-06-21 NOTE — Patient Instructions (Addendum)
Ms. Cynthia Hurst , Thank you for taking time to come for your Medicare Wellness Visit. I appreciate your ongoing commitment to your health goals. Please review the following plan we discussed and let me know if I can assist you in the future.   Screening recommendations/referrals: Colonoscopy: completed 07/02/2014, no longer required  Mammogram: completed 11/11/2016, no longer required  Bone Density: completed 05/25/2010 Recommended yearly ophthalmology/optometry visit for glaucoma screening and checkup Recommended yearly dental visit for hygiene and checkup  Vaccinations: Influenza vaccine: due 08/2017 Pneumococcal vaccine: up to date Tdap vaccine: due, check with your insurance company for coverage Shingles vaccine: due, check with your insurance company for coverage  Advanced directives:  Advance directive discussed with you today. I have provided a copy for you to complete at home and have notarized. Once this is complete please bring a copy in to our office so we can scan it into your chart.  Conditions/risks identified: Recommend drinking at least 4-5 glasses of water a day   Next appointment: Follow up on 06/26/2017 at 9:00am with Regino Schultze. Follow up in one year for your annual wellness exam.   Preventive Care 65 Years and Older, Female Preventive care refers to lifestyle choices and visits with your health care provider that can promote health and wellness. What does preventive care include?  A yearly physical exam. This is also called an annual well check.  Dental exams once or twice a year.  Routine eye exams. Ask your health care provider how often you should have your eyes checked.  Personal lifestyle choices, including:  Daily care of your teeth and gums.  Regular physical activity.  Eating a healthy diet.  Avoiding tobacco and drug use.  Limiting alcohol use.  Practicing safe sex.  Taking low-dose aspirin every day.  Taking vitamin and mineral supplements as  recommended by your health care provider. What happens during an annual well check? The services and screenings done by your health care provider during your annual well check will depend on your age, overall health, lifestyle risk factors, and family history of disease. Counseling  Your health care provider may ask you questions about your:  Alcohol use.  Tobacco use.  Drug use.  Emotional well-being.  Home and relationship well-being.  Sexual activity.  Eating habits.  History of falls.  Memory and ability to understand (cognition).  Work and work Statistician.  Reproductive health. Screening  You may have the following tests or measurements:  Height, weight, and BMI.  Blood pressure.  Lipid and cholesterol levels. These may be checked every 5 years, or more frequently if you are over 107 years old.  Skin check.  Lung cancer screening. You may have this screening every year starting at age 79 if you have a 30-pack-year history of smoking and currently smoke or have quit within the past 15 years.  Fecal occult blood test (FOBT) of the stool. You may have this test every year starting at age 63.  Flexible sigmoidoscopy or colonoscopy. You may have a sigmoidoscopy every 5 years or a colonoscopy every 10 years starting at age 65.  Hepatitis C blood test.  Hepatitis B blood test.  Sexually transmitted disease (STD) testing.  Diabetes screening. This is done by checking your blood sugar (glucose) after you have not eaten for a while (fasting). You may have this done every 1-3 years.  Bone density scan. This is done to screen for osteoporosis. You may have this done starting at age 19.  Mammogram. This may  be done every 1-2 years. Talk to your health care provider about how often you should have regular mammograms. Talk with your health care provider about your test results, treatment options, and if necessary, the need for more tests. Vaccines  Your health care  provider may recommend certain vaccines, such as:  Influenza vaccine. This is recommended every year.  Tetanus, diphtheria, and acellular pertussis (Tdap, Td) vaccine. You may need a Td booster every 10 years.  Zoster vaccine. You may need this after age 62.  Pneumococcal 13-valent conjugate (PCV13) vaccine. One dose is recommended after age 100.  Pneumococcal polysaccharide (PPSV23) vaccine. One dose is recommended after age 20. Talk to your health care provider about which screenings and vaccines you need and how often you need them. This information is not intended to replace advice given to you by your health care provider. Make sure you discuss any questions you have with your health care provider. Document Released: 12/25/2015 Document Revised: 08/17/2016 Document Reviewed: 09/29/2015 Elsevier Interactive Patient Education  2017 [employer] Prevention in the Home Falls can cause injuries. They can happen to people of all ages. There are many things you can do to make your home safe and to help prevent falls. What can I do on the outside of my home?  Regularly fix the edges of walkways and driveways and fix any cracks.  Remove anything that might make you trip as you walk through a door, such as a raised step or threshold.  Trim any bushes or trees on the path to your home.  Use bright outdoor lighting.  Clear any walking paths of anything that might make someone trip, such as rocks or tools.  Regularly check to see if handrails are loose or broken. Make sure that both sides of any steps have handrails.  Any raised decks and porches should have guardrails on the edges.  Have any leaves, snow, or ice cleared regularly.  Use sand or salt on walking paths during winter.  Clean up any spills in your garage right away. This includes oil or grease spills. What can I do in the bathroom?  Use night lights.  Install grab bars by the toilet and in the tub and shower. Do  not use towel bars as grab bars.  Use non-skid mats or decals in the tub or shower.  If you need to sit down in the shower, use a plastic, non-slip stool.  Keep the floor dry. Clean up any water that spills on the floor as soon as it happens.  Remove soap buildup in the tub or shower regularly.  Attach bath mats securely with double-sided non-slip rug tape.  Do not have throw rugs and other things on the floor that can make you trip. What can I do in the bedroom?  Use night lights.  Make sure that you have a light by your bed that is easy to reach.  Do not use any sheets or blankets that are too big for your bed. They should not hang down onto the floor.  Have a firm chair that has side arms. You can use this for support while you get dressed.  Do not have throw rugs and other things on the floor that can make you trip. What can I do in the kitchen?  Clean up any spills right away.  Avoid walking on wet floors.  Keep items that you use a lot in easy-to-reach places.  If you need to reach something above you,  use a strong step stool that has a grab bar.  Keep electrical cords out of the way.  Do not use floor polish or wax that makes floors slippery. If you must use wax, use non-skid floor wax.  Do not have throw rugs and other things on the floor that can make you trip. What can I do with my stairs?  Do not leave any items on the stairs.  Make sure that there are handrails on both sides of the stairs and use them. Fix handrails that are broken or loose. Make sure that handrails are as long as the stairways.  Check any carpeting to make sure that it is firmly attached to the stairs. Fix any carpet that is loose or worn.  Avoid having throw rugs at the top or bottom of the stairs. If you do have throw rugs, attach them to the floor with carpet tape.  Make sure that you have a light switch at the top of the stairs and the bottom of the stairs. If you do not have them,  ask someone to add them for you. What else can I do to help prevent falls?  Wear shoes that:  Do not have high heels.  Have rubber bottoms.  Are comfortable and fit you well.  Are closed at the toe. Do not wear sandals.  If you use a stepladder:  Make sure that it is fully opened. Do not climb a closed stepladder.  Make sure that both sides of the stepladder are locked into place.  Ask someone to hold it for you, if possible.  Clearly mark and make sure that you can see:  Any grab bars or handrails.  First and last steps.  Where the edge of each step is.  Use tools that help you move around (mobility aids) if they are needed. These include:  Canes.  Walkers.  Scooters.  Crutches.  Turn on the lights when you go into a dark area. Replace any light bulbs as soon as they burn out.  Set up your furniture so you have a clear path. Avoid moving your furniture around.  If any of your floors are uneven, fix them.  If there are any pets around you, be aware of where they are.  Review your medicines with your doctor. Some medicines can make you feel dizzy. This can increase your chance of falling. Ask your doctor what other things that you can do to help prevent falls. This information is not intended to replace advice given to you by your health care provider. Make sure you discuss any questions you have with your health care provider. Document Released: 09/24/2009 Document Revised: 05/05/2016 Document Reviewed: 01/02/2015 Elsevier Interactive Patient Education  2017 [employer].

## 2017-06-26 ENCOUNTER — Encounter: Payer: Medicare Other | Admitting: Unknown Physician Specialty

## 2017-07-12 ENCOUNTER — Encounter: Payer: Self-pay | Admitting: Unknown Physician Specialty

## 2017-07-12 ENCOUNTER — Ambulatory Visit (INDEPENDENT_AMBULATORY_CARE_PROVIDER_SITE_OTHER): Payer: Medicare Other | Admitting: Unknown Physician Specialty

## 2017-07-12 VITALS — BP 110/63 | HR 64 | Temp 97.4°F | Ht 63.0 in | Wt 178.2 lb

## 2017-07-12 DIAGNOSIS — I129 Hypertensive chronic kidney disease with stage 1 through stage 4 chronic kidney disease, or unspecified chronic kidney disease: Secondary | ICD-10-CM

## 2017-07-12 DIAGNOSIS — R5382 Chronic fatigue, unspecified: Secondary | ICD-10-CM

## 2017-07-12 DIAGNOSIS — Z7189 Other specified counseling: Secondary | ICD-10-CM | POA: Diagnosis not present

## 2017-07-12 DIAGNOSIS — E538 Deficiency of other specified B group vitamins: Secondary | ICD-10-CM | POA: Diagnosis not present

## 2017-07-12 DIAGNOSIS — Z0000 Encounter for general adult medical examination without abnormal findings: Secondary | ICD-10-CM

## 2017-07-12 DIAGNOSIS — E559 Vitamin D deficiency, unspecified: Secondary | ICD-10-CM | POA: Diagnosis not present

## 2017-07-12 NOTE — Assessment & Plan Note (Signed)
A voluntary discussion about advance care planning including the explanation and discussion of advance directives was extensively discussed  with the patient.  Explanation about the health care proxy and Living will was reviewed and packet with forms with explanation of how to fill them out was given.  During this discussion, the patient was able to identify a health care proxy as her husband and son as secondary.  She  Plans to discuss forms with son.   Patient was offered a separate [place] visit for further assistance with forms.

## 2017-07-12 NOTE — Progress Notes (Signed)
BP 110/63   Pulse 64   Temp (!) 97.4 F (36.3 C)   Ht 5\' 3"  (1.6 m)   Wt 178 lb 3.2 oz (80.8 kg)   LMP  (LMP Unknown)   SpO2 90%   BMI 31.57 kg/m    Subjective:    Patient ID: Cynthia Hurst, female    DOB: 1935/05/14, 82 y.o.   MRN: [id number]  HPI: Cynthia Hurst is a 82 y.o. female  Chief Complaint  Patient presents with  . Annual Exam    pt had Wellness exam 06/21/17 with [facility]   Reviewed nurse health advisor's note.    Hypertension Using medications without difficulty Average home BPs   No problems or lightheadedness No chest pain with exertion or shortness of breath No Edema  Fatigue States for about 6 months she feels tired all the time and wonders why.  Sleeps well at night and gets about 7A.  Feels rested in the AM.    Depression screen [facility] 2/9 06/21/2017 06/10/2016 06/10/2015  Decreased Interest 0 0 0  Down, Depressed, Hopeless 0 0 0  PHQ - 2 Score 0 0 0    Relevant past medical, surgical, family and social history reviewed and updated as indicated. Interim medical history since our last visit reviewed. Allergies and medications reviewed and updated.  Review of Systems  Constitutional: Positive for fatigue.  HENT: Negative.   Eyes: Negative.   Respiratory: Negative.   Cardiovascular: Negative.   Gastrointestinal: Negative.   Endocrine: Negative.   Genitourinary: Negative.   Musculoskeletal:       Some left shoulder pain after lying on it for a while  Allergic/Immunologic: Negative.   Neurological: Negative.   Hematological: Negative.   Psychiatric/Behavioral: Negative.     Per HPI unless specifically indicated above     Objective:    BP 110/63   Pulse 64   Temp (!) 97.4 F (36.3 C)   Ht 5\' 3"  (1.6 m)   Wt 178 lb 3.2 oz (80.8 kg)   LMP  (LMP Unknown)   SpO2 90%   BMI 31.57 kg/m   Wt Readings from Last 3 Encounters:  07/12/17 178 lb 3.2 oz (80.8 kg)  06/21/17 180 lb 12.8 oz (82 kg)  06/16/17 180 lb 12.8 oz (82 kg)    Physical Exam   Constitutional: She is oriented to person, place, and time. She appears well-developed and well-nourished.  HENT:  Head: Normocephalic and atraumatic.  Eyes: Pupils are equal, round, and reactive to light. Right eye exhibits no discharge. Left eye exhibits no discharge. No scleral icterus.  Neck: Normal range of motion. Neck supple. Carotid bruit is not present. No thyromegaly present.  Cardiovascular: Normal rate, regular rhythm and normal heart sounds.  Exam reveals no gallop and no friction rub.   No murmur heard. Pulmonary/Chest: Effort normal and breath sounds normal. No respiratory distress. She has no wheezes. She has no rales.  Abdominal: Soft. Bowel sounds are normal. There is no tenderness. There is no rebound.  Genitourinary: No breast swelling, tenderness or discharge.  Musculoskeletal: Normal range of motion.  Lymphadenopathy:    She has no cervical adenopathy.  Neurological: She is alert and oriented to person, place, and time.  Skin: Skin is warm, dry and intact. No rash noted.  Psychiatric: She has a normal mood and affect. Her speech is normal and behavior is normal. Judgment and thought content normal. Cognition and memory are normal.    Results for orders placed or  performed in visit on 02/27/17  Magnesium  Result Value Ref Range   Magnesium 1.8 1.6 - 2.3 mg/dL  Comprehensive metabolic panel  Result Value Ref Range   Glucose 94 65 - 99 mg/dL   BUN 15 8 - 27 mg/dL   Creatinine, Ser 0.87 0.57 - 1.00 mg/dL   GFR calc non Af Amer 63 >59 mL/min/1.73   GFR calc Af Amer 73 >59 mL/min/1.73   BUN/Creatinine Ratio 17 12 - 28   Sodium 143 134 - 144 mmol/L   Potassium 4.2 3.5 - 5.2 mmol/L   Chloride 104 96 - 106 mmol/L   CO2 24 18 - 29 mmol/L   Calcium 9.6 8.7 - 10.3 mg/dL   Total Protein 7.0 6.0 - 8.5 g/dL   Albumin 4.1 3.5 - 4.7 g/dL   Globulin, Total 2.9 1.5 - 4.5 g/dL   Albumin/Globulin Ratio 1.4 1.2 - 2.2   Bilirubin Total 0.5 0.0 - 1.2 mg/dL   Alkaline  Phosphatase 102 39 - 117 IU/L   AST 29 0 - 40 IU/L   ALT 12 0 - 32 IU/L      Assessment & Plan:   Problem List Items Addressed This Visit      Unprioritized   Advanced care planning/counseling discussion    A voluntary discussion about advance care planning including the explanation and discussion of advance directives was extensively discussed  with the patient.  Explanation about the health care proxy and Living will was reviewed and packet with forms with explanation of how to fill them out was given.  During this discussion, the patient was able to identify a health care proxy as her husband and son as secondary.  She  Plans to discuss forms with son.   Patient was offered a separate [place] visit for further assistance with forms.         Benign hypertension with chronic kidney disease    Stable, continue present medications.        Relevant Orders   Comprehensive metabolic panel   Lipid Panel w/o Chol/HDL Ratio   Vitamin B12 deficiency    Taking a supplement.  Medicare unwilling to pay for labs      Vitamin D deficiency    Encouraged to take OTC Vitamin D supplement she is currently taking.    Medicare unwilling to pay for Vit D labs       Other Visit Diagnoses    Routine general medical examination at a health care facility    -  Primary   Chronic fatigue       Relevant Orders   CBC with Differential/Platelet   TSH       Follow up plan: Return in about 6 months (around 01/12/2018).

## 2017-07-12 NOTE — Assessment & Plan Note (Signed)
Taking a supplement.  Medicare unwilling to pay for labs

## 2017-07-12 NOTE — Assessment & Plan Note (Addendum)
Encouraged to take OTC Vitamin D supplement she is currently taking.    Medicare unwilling to pay for Vit D labs

## 2017-07-12 NOTE — Assessment & Plan Note (Signed)
Stable, continue present medications.   

## 2017-07-13 LAB — CBC WITH DIFFERENTIAL/PLATELET
BASOS ABS: 0 10*3/uL (ref 0.0–0.2)
BASOS: 0 %
EOS (ABSOLUTE): 0.1 10*3/uL (ref 0.0–0.4)
Eos: 1 %
HEMOGLOBIN: 12.2 g/dL (ref 11.1–15.9)
Hematocrit: 37.9 % (ref 34.0–46.6)
IMMATURE GRANS (ABS): 0 10*3/uL (ref 0.0–0.1)
IMMATURE GRANULOCYTES: 0 %
LYMPHS: 33 %
Lymphocytes Absolute: 2.2 10*3/uL (ref 0.7–3.1)
MCH: 30 pg (ref 26.6–33.0)
MCHC: 32.2 g/dL (ref 31.5–35.7)
MCV: 93 fL (ref 79–97)
Monocytes Absolute: 0.4 10*3/uL (ref 0.1–0.9)
Monocytes: 6 %
NEUTROS PCT: 60 %
Neutrophils Absolute: 3.9 10*3/uL (ref 1.4–7.0)
Platelets: 181 10*3/uL (ref 150–379)
RBC: 4.07 x10E6/uL (ref 3.77–5.28)
RDW: 15.6 % — ABNORMAL HIGH (ref 12.3–15.4)
WBC: 6.5 10*3/uL (ref 3.4–10.8)

## 2017-07-13 LAB — COMPREHENSIVE METABOLIC PANEL
ALBUMIN: 4.1 g/dL (ref 3.5–4.7)
ALK PHOS: 107 IU/L (ref 39–117)
ALT: 8 IU/L (ref 0–32)
AST: 17 IU/L (ref 0–40)
Albumin/Globulin Ratio: 1.5 (ref 1.2–2.2)
BUN / CREAT RATIO: 14 (ref 12–28)
BUN: 13 mg/dL (ref 8–27)
Bilirubin Total: 0.6 mg/dL (ref 0.0–1.2)
CO2: 25 mmol/L (ref 20–29)
CREATININE: 0.95 mg/dL (ref 0.57–1.00)
Calcium: 10 mg/dL (ref 8.7–10.3)
Chloride: 106 mmol/L (ref 96–106)
GFR calc Af Amer: 65 mL/min/{1.73_m2} (ref >59)
GFR calc non Af Amer: 57 mL/min/{1.73_m2} — ABNORMAL LOW (ref >59)
GLUCOSE: 84 mg/dL (ref 65–99)
Globulin, Total: 2.7 g/dL (ref 1.5–4.5)
Potassium: 4.2 mmol/L (ref 3.5–5.2)
Sodium: 143 mmol/L (ref 134–144)
Total Protein: 6.8 g/dL (ref 6.0–8.5)

## 2017-07-13 LAB — TSH: TSH: 2.67 u[IU]/mL (ref 0.450–4.500)

## 2017-07-13 LAB — LIPID PANEL W/O CHOL/HDL RATIO
CHOLESTEROL TOTAL: 161 mg/dL (ref 100–199)
HDL: 58 mg/dL (ref >39)
LDL Calculated: 76 mg/dL (ref 0–99)
TRIGLYCERIDES: 133 mg/dL (ref 0–149)
VLDL Cholesterol Cal: 27 mg/dL (ref 5–40)

## 2017-07-14 ENCOUNTER — Encounter: Payer: Self-pay | Admitting: Unknown Physician Specialty

## 2017-07-21 ENCOUNTER — Other Ambulatory Visit: Payer: Self-pay | Admitting: Unknown Physician Specialty

## 2017-07-26 ENCOUNTER — Other Ambulatory Visit: Payer: Self-pay | Admitting: Unknown Physician Specialty

## 2017-07-28 ENCOUNTER — Telehealth: Payer: Self-pay | Admitting: Unknown Physician Specialty

## 2017-07-28 NOTE — Telephone Encounter (Signed)
Called and spoke to patient. She states that the pharmacy needs something from [place] before they will fill her medication. Will call the pharmacy and see what is going on.

## 2017-07-28 NOTE — Telephone Encounter (Signed)
Tried calling patient because medication was sent in 07/21/17 for #90 with 1 refill. Got a busy signal when I tried calling, will try to call again later.

## 2017-07-28 NOTE — Telephone Encounter (Signed)
Called and left patient a VM (signed DPR) letting her know that the pharmacy stated that they are waiting on her to call them and send in her payment for her prescription. I asked for her to give me a call with questions or concerns.

## 2017-07-28 NOTE — Telephone Encounter (Signed)
Called [facility] to see what was going on with the patient's prescription. I explained that the patient told me that they needed authorization from [place] to fill her prescription. The lady I spoke with said no, and that they need the patient to call them and say that she needs her prescription sent to her. The lady states that with Medicare patient's they cannot just send out prescriptions without the patient calling. She also stated that they are waiting on the patient to pay for her prescription as well. Will call patient and let her know this information.

## 2017-07-31 ENCOUNTER — Other Ambulatory Visit: Payer: Self-pay | Admitting: Unknown Physician Specialty

## 2017-08-02 ENCOUNTER — Telehealth: Payer: Self-pay

## 2017-08-02 MED ORDER — NIFEDIPINE ER OSMOTIC RELEASE 60 MG PO TB24: 60.0000 mg | ORAL_TABLET | Freq: Every day | ORAL | 0 refills | Status: DC

## 2017-08-02 NOTE — Telephone Encounter (Signed)
Called and let patient know that a 30 day supply of her medication was sent in for her to the local pharmacy.

## 2017-08-02 NOTE — Telephone Encounter (Signed)
Patient called and stated that her nifedipine prescription from her mail order will not arrive before she leaves to go out of town. Patient would like to have a short supply sent to her local pharmacy so she does not run out.

## 2017-08-08 ENCOUNTER — Telehealth: Payer: Self-pay | Admitting: Unknown Physician Specialty

## 2017-08-08 NOTE — Telephone Encounter (Signed)
Patient has been having cramps in her hands and legs any time through the day. She would like for Malachy Mood to give him a suggestion for what to take and she also needs to know if she should continue taking the magnesium citrate.  Please advise  Thank you

## 2017-08-08 NOTE — Telephone Encounter (Signed)
Message relayed to patient. Verbalized understanding and denied questions.   

## 2017-08-08 NOTE — Telephone Encounter (Signed)
Routing to provider for advice.

## 2017-08-08 NOTE — Telephone Encounter (Signed)
I would continue taking the Magnesium.  Try drinking tonic water to see if it helps the cramps

## 2017-08-21 DIAGNOSIS — L986 Other infiltrative disorders of the skin and subcutaneous tissue: Secondary | ICD-10-CM | POA: Diagnosis not present

## 2017-08-21 DIAGNOSIS — C8400 Mycosis fungoides, unspecified site: Secondary | ICD-10-CM | POA: Diagnosis not present

## 2017-10-02 DIAGNOSIS — R21 Rash and other nonspecific skin eruption: Secondary | ICD-10-CM | POA: Diagnosis not present

## 2017-10-13 ENCOUNTER — Other Ambulatory Visit: Payer: Self-pay | Admitting: Unknown Physician Specialty

## 2017-11-13 ENCOUNTER — Ambulatory Visit (INDEPENDENT_AMBULATORY_CARE_PROVIDER_SITE_OTHER): Payer: Medicare Other | Admitting: Unknown Physician Specialty

## 2017-11-13 ENCOUNTER — Encounter: Payer: Self-pay | Admitting: Unknown Physician Specialty

## 2017-11-13 VITALS — BP 105/64 | HR 65 | Temp 98.7°F | Wt 177.8 lb

## 2017-11-13 DIAGNOSIS — R42 Dizziness and giddiness: Secondary | ICD-10-CM

## 2017-11-13 DIAGNOSIS — J0100 Acute maxillary sinusitis, unspecified: Secondary | ICD-10-CM | POA: Diagnosis not present

## 2017-11-13 MED ORDER — AMOXICILLIN 875 MG PO TABS: 875.0000 mg | ORAL_TABLET | Freq: Two times a day (BID) | ORAL | 0 refills | Status: DC

## 2017-11-13 MED ORDER — MECLIZINE HCL 25 MG PO TABS: 25.0000 mg | ORAL_TABLET | Freq: Three times a day (TID) | ORAL | 0 refills | Status: DC | PRN

## 2017-11-13 NOTE — Progress Notes (Signed)
BP 105/64   Pulse 65   Temp 98.7 F (37.1 C) (Oral)   Wt 177 lb 12.8 oz (80.6 kg)   LMP  (LMP Unknown)   SpO2 94%   BMI 31.50 kg/m    Subjective:    Patient ID: Cynthia Hurst, female    DOB: 07-23-1935, 82 y.o.   MRN: [id number]  HPI: Cynthia Hurst is a 82 y.o. female  Chief Complaint  Patient presents with  . Dizziness    pt states she has been having dizzy spells since Saturday   Dizziness  This is a new problem. Episode onset: 2 days. The problem occurs intermittently. The problem has been unchanged. Associated symptoms include vertigo. Pertinent negatives include no abdominal pain, anorexia, arthralgias, change in bowel habit, chest pain, chills, congestion, coughing, fatigue, fever, headaches, joint swelling, myalgias, nausea, neck pain, numbness, rash, sore throat, swollen glands, urinary symptoms, visual change, vomiting or weakness. Associated symptoms comments: Sinus drainage [place]. Exacerbated by: turning around. She has tried nothing for the symptoms.    Relevant past medical, surgical, family and social history reviewed and updated as indicated. Interim medical history since our last visit reviewed. Allergies and medications reviewed and updated.  Review of Systems  Constitutional: Negative for chills, fatigue and fever.  HENT: Negative for congestion and sore throat.   Respiratory: Negative for cough.   Cardiovascular: Negative for chest pain.  Gastrointestinal: Negative for abdominal pain, anorexia, change in bowel habit, nausea and vomiting.  Musculoskeletal: Negative for arthralgias, joint swelling, myalgias and neck pain.  Skin: Negative for rash.  Neurological: Positive for dizziness and vertigo. Negative for weakness, numbness and headaches.    Per HPI unless specifically indicated above     Objective:    BP 105/64   Pulse 65   Temp 98.7 F (37.1 C) (Oral)   Wt 177 lb 12.8 oz (80.6 kg)   LMP  (LMP Unknown)   SpO2 94%   BMI 31.50 kg/m     Wt Readings from Last 3 Encounters:  11/13/17 177 lb 12.8 oz (80.6 kg)  07/12/17 178 lb 3.2 oz (80.8 kg)  06/21/17 180 lb 12.8 oz (82 kg)    Physical Exam  Constitutional: She is oriented to person, place, and time. She appears well-developed and well-nourished. No distress.  HENT:  Head: Normocephalic and atraumatic.  Right Ear: Tympanic membrane and ear canal normal.  Left Ear: Tympanic membrane and ear canal normal.  Nose: No rhinorrhea. Right sinus exhibits maxillary sinus tenderness. Right sinus exhibits no frontal sinus tenderness. Left sinus exhibits maxillary sinus tenderness. Left sinus exhibits no frontal sinus tenderness.  Eyes: Conjunctivae and lids are normal. Right eye exhibits no discharge. Left eye exhibits no discharge. No scleral icterus.  Cardiovascular: Normal rate and regular rhythm.  Pulmonary/Chest: Effort normal and breath sounds normal. No respiratory distress.  Abdominal: Normal appearance. There is no splenomegaly or hepatomegaly.  Musculoskeletal: Normal range of motion.  Neurological: She is alert and oriented to person, place, and time.  Skin: Skin is intact. No rash noted. No pallor.  Psychiatric: She has a normal mood and affect. Her behavior is normal. Judgment and thought content normal.    Results for orders placed or performed in visit on 07/12/17  CBC with Differential/Platelet  Result Value Ref Range   WBC 6.5 3.4 - 10.8 x10E3/uL   RBC 4.07 3.77 - 5.28 x10E6/uL   Hemoglobin 12.2 11.1 - 15.9 g/dL   Hematocrit 37.9 34.0 - 46.6 %  MCV 93 79 - 97 fL   MCH 30.0 26.6 - 33.0 pg   MCHC 32.2 31.5 - 35.7 g/dL   RDW 15.6 (H) 12.3 - 15.4 %   Platelets 181 150 - 379 x10E3/uL   Neutrophils 60 Not Estab. %   Lymphs 33 Not Estab. %   Monocytes 6 Not Estab. %   Eos 1 Not Estab. %   Basos 0 Not Estab. %   Neutrophils Absolute 3.9 1.4 - 7.0 x10E3/uL   Lymphocytes Absolute 2.2 0.7 - 3.1 x10E3/uL   Monocytes Absolute 0.4 0.1 - 0.9 x10E3/uL   EOS  (ABSOLUTE) 0.1 0.0 - 0.4 x10E3/uL   Basophils Absolute 0.0 0.0 - 0.2 x10E3/uL   Immature Granulocytes 0 Not Estab. %   Immature Grans (Abs) 0.0 0.0 - 0.1 x10E3/uL  Comprehensive metabolic panel  Result Value Ref Range   Glucose 84 65 - 99 mg/dL   BUN 13 8 - 27 mg/dL   Creatinine, Ser 0.95 0.57 - 1.00 mg/dL   GFR calc non Af Amer 57 (L) >59 mL/min/1.73   GFR calc Af Amer 65 >59 mL/min/1.73   BUN/Creatinine Ratio 14 12 - 28   Sodium 143 134 - 144 mmol/L   Potassium 4.2 3.5 - 5.2 mmol/L   Chloride 106 96 - 106 mmol/L   CO2 25 20 - 29 mmol/L   Calcium 10.0 8.7 - 10.3 mg/dL   Total Protein 6.8 6.0 - 8.5 g/dL   Albumin 4.1 3.5 - 4.7 g/dL   Globulin, Total 2.7 1.5 - 4.5 g/dL   Albumin/Globulin Ratio 1.5 1.2 - 2.2   Bilirubin Total 0.6 0.0 - 1.2 mg/dL   Alkaline Phosphatase 107 39 - 117 IU/L   AST 17 0 - 40 IU/L   ALT 8 0 - 32 IU/L  Lipid Panel w/o Chol/HDL Ratio  Result Value Ref Range   Cholesterol, Total 161 100 - 199 mg/dL   Triglycerides 133 0 - 149 mg/dL   HDL 58 >39 mg/dL   VLDL Cholesterol Cal 27 5 - 40 mg/dL   LDL Calculated 76 0 - 99 mg/dL  TSH  Result Value Ref Range   TSH 2.670 0.450 - 4.500 uIU/mL      Assessment & Plan:   Problem List Items Addressed This Visit    None    Visit Diagnoses    Vertigo    -  Primary   New systemic symptom.  Suspect secondary to sinusitis.  Rx for Meclizine to take prn dizzy   Acute non-recurrent maxillary sinusitis       New problem.  Sinus drainage for a month that is worsening.  Will rx with Amoxil 875 mg BID for 10 day.     Relevant Medications   amoxicillin (AMOXIL) 875 MG tablet       Follow up plan: Return if symptoms worsen or fail to improve.

## 2017-12-01 ENCOUNTER — Other Ambulatory Visit: Payer: Self-pay | Admitting: Unknown Physician Specialty

## 2017-12-01 NOTE — Telephone Encounter (Signed)
Your patient 

## 2017-12-26 ENCOUNTER — Other Ambulatory Visit: Payer: Self-pay | Admitting: Unknown Physician Specialty

## 2017-12-26 NOTE — Telephone Encounter (Signed)
Routing to provider  

## 2017-12-26 NOTE — Telephone Encounter (Signed)
Please see request for refill on Raloxifene 60 mg; I was unable to find documentation as to why she is taking this medication.

## 2018-01-02 ENCOUNTER — Other Ambulatory Visit: Payer: Self-pay

## 2018-01-02 MED ORDER — OMEPRAZOLE 20 MG PO CPDR: 20.0000 mg | DELAYED_RELEASE_CAPSULE | Freq: Every day | ORAL | 0 refills | Status: DC

## 2018-01-02 NOTE — Telephone Encounter (Signed)
Patient last seen 07/12/17 and has f/up 01/19/18.

## 2018-01-15 ENCOUNTER — Ambulatory Visit (INDEPENDENT_AMBULATORY_CARE_PROVIDER_SITE_OTHER): Payer: Medicare Other | Admitting: Family Medicine

## 2018-01-15 ENCOUNTER — Encounter: Payer: Self-pay | Admitting: Family Medicine

## 2018-01-15 VITALS — BP 145/72 | HR 59 | Temp 97.6°F | Wt 177.4 lb

## 2018-01-15 DIAGNOSIS — R42 Dizziness and giddiness: Secondary | ICD-10-CM

## 2018-01-15 MED ORDER — MECLIZINE HCL 25 MG PO TABS: 25.0000 mg | ORAL_TABLET | Freq: Three times a day (TID) | ORAL | 0 refills | Status: DC | PRN

## 2018-01-15 NOTE — Patient Instructions (Addendum)

## 2018-01-15 NOTE — Progress Notes (Signed)
BP (!) 145/72 (BP Location: Left Arm, Patient Position: Sitting, Cuff Size: Normal)   Pulse (!) 59   Temp 97.6 F (36.4 C)   Wt 177 lb 7 oz (80.5 kg)   LMP  (LMP Unknown)   SpO2 96%   BMI 31.43 kg/m    Subjective:    Patient ID: Cynthia Hurst, female    DOB: [birth year], 83 y.o.   MRN: [id number]  HPI: Cynthia Hurst is a 83 y.o. female  Chief Complaint  Patient presents with  . Dizziness   DIZZINESS- diagnosed with vertigo about 2 months ago. Thought to be due to sinusitis- was treated, and then went away until last night Duration: 1 day Description of symptoms: room spinning Duration of episode: minutes Dizziness frequency: sat up in bed Provoking factors: unknown Aggravating factors:  none Triggered by rolling over in bed: no Triggered by bending over: no Aggravated by head movement: no Aggravated by exertion, coughing, loud noises: no Recent head injury: no Recent or current viral symptoms: no History of vasovagal episodes: no Nausea: no Vomiting: no Tinnitus: no Hearing loss: no Aural fullness: no Headache: no Photophobia/phonophobia: no Unsteady gait: no Postural instability: yes Diplopia, dysarthria, dysphagia or weakness: no Related to exertion: no Pallor: no Diaphoresis: no Dyspnea: no Chest pain: no   Relevant past medical, surgical, family and social history reviewed and updated as indicated. Interim medical history since our last visit reviewed. Allergies and medications reviewed and updated.  Review of Systems  Constitutional: Negative.   HENT: Negative.   Eyes: Negative.   Respiratory: Negative.   Cardiovascular: Negative.   Neurological: Positive for dizziness and light-headedness. Negative for tremors, seizures, syncope, facial asymmetry, speech difficulty, weakness, numbness and headaches.  Psychiatric/Behavioral: Negative.     Per HPI unless specifically indicated above     Objective:    BP (!) 145/72 (BP Location: Left Arm,  Patient Position: Sitting, Cuff Size: Normal)   Pulse (!) 59   Temp 97.6 F (36.4 C)   Wt 177 lb 7 oz (80.5 kg)   LMP  (LMP Unknown)   SpO2 96%   BMI 31.43 kg/m   Wt Readings from Last 3 Encounters:  01/15/18 177 lb 7 oz (80.5 kg)  11/13/17 177 lb 12.8 oz (80.6 kg)  07/12/17 178 lb 3.2 oz (80.8 kg)    Orthostatic VS for the past 24 hrs:  BP- Lying Pulse- Lying BP- Sitting Pulse- Sitting BP- Standing at 0 minutes Pulse- Standing at 0 minutes  01/15/18 1122 137/74 62 144/79 60 130/75 63    Physical Exam  Constitutional: She is oriented to person, place, and time. She appears well-developed and well-nourished. No distress.  HENT:  Head: Normocephalic and atraumatic.  Right Ear: Hearing, tympanic membrane, external ear and ear canal normal.  Left Ear: Hearing, tympanic membrane, external ear and ear canal normal.  Nose: Nose normal.  Mouth/Throat: Uvula is midline, oropharynx is clear and moist and mucous membranes are normal. No oropharyngeal exudate.  Eyes: Conjunctivae and lids are normal. Pupils are equal, round, and reactive to light. Right eye exhibits no discharge. Left eye exhibits no discharge. No scleral icterus. Right eye exhibits nystagmus. Right eye exhibits normal extraocular motion. Left eye exhibits nystagmus. Left eye exhibits normal extraocular motion.  Neck: Normal range of motion. Neck supple. No JVD present. No tracheal deviation present. No thyromegaly present.  Cardiovascular: Normal rate, regular rhythm, normal heart sounds and intact distal pulses. Exam reveals no gallop and no friction rub.  No murmur heard. Pulmonary/Chest: Effort normal and breath sounds normal. No stridor. No respiratory distress. She has no wheezes. She has no rales. She exhibits no tenderness.  Musculoskeletal: Normal range of motion.  Lymphadenopathy:    She has no cervical adenopathy.  Neurological: She is alert and oriented to person, place, and time.  Skin: Skin is warm, dry and  intact. No rash noted. She is not diaphoretic. No erythema. No pallor.  Psychiatric: She has a normal mood and affect. Her speech is normal and behavior is normal. Judgment and thought content normal. Cognition and memory are normal.  Nursing note and vitals reviewed.   Results for orders placed or performed in visit on 07/12/17  CBC with Differential/Platelet  Result Value Ref Range   WBC 6.5 3.4 - 10.8 x10E3/uL   RBC 4.07 3.77 - 5.28 x10E6/uL   Hemoglobin 12.2 11.1 - 15.9 g/dL   Hematocrit 37.9 34.0 - 46.6 %   MCV 93 79 - 97 fL   MCH 30.0 26.6 - 33.0 pg   MCHC 32.2 31.5 - 35.7 g/dL   RDW 15.6 (H) 12.3 - 15.4 %   Platelets 181 150 - 379 x10E3/uL   Neutrophils 60 Not Estab. %   Lymphs 33 Not Estab. %   Monocytes 6 Not Estab. %   Eos 1 Not Estab. %   Basos 0 Not Estab. %   Neutrophils Absolute 3.9 1.4 - 7.0 x10E3/uL   Lymphocytes Absolute 2.2 0.7 - 3.1 x10E3/uL   Monocytes Absolute 0.4 0.1 - 0.9 x10E3/uL   EOS (ABSOLUTE) 0.1 0.0 - 0.4 x10E3/uL   Basophils Absolute 0.0 0.0 - 0.2 x10E3/uL   Immature Granulocytes 0 Not Estab. %   Immature Grans (Abs) 0.0 0.0 - 0.1 x10E3/uL  Comprehensive metabolic panel  Result Value Ref Range   Glucose 84 65 - 99 mg/dL   BUN 13 8 - 27 mg/dL   Creatinine, Ser 0.95 0.57 - 1.00 mg/dL   GFR calc non Af Amer 57 (L) >59 mL/min/1.73   GFR calc Af Amer 65 >59 mL/min/1.73   BUN/Creatinine Ratio 14 12 - 28   Sodium 143 134 - 144 mmol/L   Potassium 4.2 3.5 - 5.2 mmol/L   Chloride 106 96 - 106 mmol/L   CO2 25 20 - 29 mmol/L   Calcium 10.0 8.7 - 10.3 mg/dL   Total Protein 6.8 6.0 - 8.5 g/dL   Albumin 4.1 3.5 - 4.7 g/dL   Globulin, Total 2.7 1.5 - 4.5 g/dL   Albumin/Globulin Ratio 1.5 1.2 - 2.2   Bilirubin Total 0.6 0.0 - 1.2 mg/dL   Alkaline Phosphatase 107 39 - 117 IU/L   AST 17 0 - 40 IU/L   ALT 8 0 - 32 IU/L  Lipid Panel w/o Chol/HDL Ratio  Result Value Ref Range   Cholesterol, Total 161 100 - 199 mg/dL   Triglycerides 133 0 - 149 mg/dL   HDL  58 >39 mg/dL   VLDL Cholesterol Cal 27 5 - 40 mg/dL   LDL Calculated 76 0 - 99 mg/dL  TSH  Result Value Ref Range   TSH 2.670 0.450 - 4.500 uIU/mL      Assessment & Plan:   Problem List Items Addressed This Visit    None    Visit Diagnoses    Vertigo    -  Primary   Epley's manuver and meclizine given. Call if not getting better and we'll get her into PT.   Dizziness  Checking labs. Await results. Orthostatic normal. Seems to be vertigo.   Relevant Orders   CBC with Differential/Platelet   Comprehensive metabolic panel       Follow up plan: Return As scheduled.

## 2018-01-16 ENCOUNTER — Encounter: Payer: Self-pay | Admitting: Family Medicine

## 2018-01-16 ENCOUNTER — Other Ambulatory Visit: Payer: Self-pay | Admitting: Unknown Physician Specialty

## 2018-01-16 LAB — CBC WITH DIFFERENTIAL/PLATELET
BASOS: 0 %
Basophils Absolute: 0 10*3/uL (ref 0.0–0.2)
EOS (ABSOLUTE): 0.1 10*3/uL (ref 0.0–0.4)
Eos: 1 %
Hematocrit: 37.2 % (ref 34.0–46.6)
Hemoglobin: 12.4 g/dL (ref 11.1–15.9)
IMMATURE GRANS (ABS): 0 10*3/uL (ref 0.0–0.1)
Immature Granulocytes: 0 %
LYMPHS ABS: 1.8 10*3/uL (ref 0.7–3.1)
Lymphs: 25 %
MCH: 30.8 pg (ref 26.6–33.0)
MCHC: 33.3 g/dL (ref 31.5–35.7)
MCV: 93 fL (ref 79–97)
Monocytes Absolute: 0.6 10*3/uL (ref 0.1–0.9)
Monocytes: 9 %
NEUTROS ABS: 4.5 10*3/uL (ref 1.4–7.0)
Neutrophils: 65 %
PLATELETS: 178 10*3/uL (ref 150–379)
RBC: 4.02 x10E6/uL (ref 3.77–5.28)
RDW: 15.2 % (ref 12.3–15.4)
WBC: 6.9 10*3/uL (ref 3.4–10.8)

## 2018-01-16 LAB — COMPREHENSIVE METABOLIC PANEL
A/G RATIO: 1.4 (ref 1.2–2.2)
ALT: 11 IU/L (ref 0–32)
AST: 18 IU/L (ref 0–40)
Albumin: 4.1 g/dL (ref 3.5–4.7)
Alkaline Phosphatase: 111 IU/L (ref 39–117)
BILIRUBIN TOTAL: 0.4 mg/dL (ref 0.0–1.2)
BUN/Creatinine Ratio: 18 (ref 12–28)
BUN: 14 mg/dL (ref 8–27)
CHLORIDE: 103 mmol/L (ref 96–106)
CO2: 23 mmol/L (ref 20–29)
Calcium: 10.4 mg/dL — ABNORMAL HIGH (ref 8.7–10.3)
Creatinine, Ser: 0.76 mg/dL (ref 0.57–1.00)
GFR calc non Af Amer: 74 mL/min/{1.73_m2} (ref >59)
GFR, EST AFRICAN AMERICAN: 86 mL/min/{1.73_m2} (ref >59)
Globulin, Total: 2.9 g/dL (ref 1.5–4.5)
Glucose: 116 mg/dL — ABNORMAL HIGH (ref 65–99)
POTASSIUM: 4.1 mmol/L (ref 3.5–5.2)
Sodium: 141 mmol/L (ref 134–144)
TOTAL PROTEIN: 7 g/dL (ref 6.0–8.5)

## 2018-01-19 ENCOUNTER — Ambulatory Visit: Payer: Medicare Other | Admitting: Unknown Physician Specialty

## 2018-01-22 ENCOUNTER — Ambulatory Visit (INDEPENDENT_AMBULATORY_CARE_PROVIDER_SITE_OTHER): Payer: Medicare Other | Admitting: Unknown Physician Specialty

## 2018-01-22 ENCOUNTER — Telehealth: Payer: Self-pay | Admitting: Unknown Physician Specialty

## 2018-01-22 ENCOUNTER — Encounter: Payer: Self-pay | Admitting: Unknown Physician Specialty

## 2018-01-22 VITALS — BP 117/67 | HR 69 | Temp 98.3°F | Wt 174.4 lb

## 2018-01-22 DIAGNOSIS — E538 Deficiency of other specified B group vitamins: Secondary | ICD-10-CM | POA: Diagnosis not present

## 2018-01-22 DIAGNOSIS — M542 Cervicalgia: Secondary | ICD-10-CM

## 2018-01-22 DIAGNOSIS — I129 Hypertensive chronic kidney disease with stage 1 through stage 4 chronic kidney disease, or unspecified chronic kidney disease: Secondary | ICD-10-CM

## 2018-01-22 DIAGNOSIS — N183 Chronic kidney disease, stage 3 unspecified: Secondary | ICD-10-CM

## 2018-01-22 MED ORDER — MELOXICAM 15 MG PO TABS: 15.0000 mg | ORAL_TABLET | Freq: Every day | ORAL | 0 refills | Status: DC

## 2018-01-22 MED ORDER — BACLOFEN 10 MG PO TABS: 10.0000 mg | ORAL_TABLET | Freq: Three times a day (TID) | ORAL | 0 refills | Status: DC

## 2018-01-22 MED ORDER — ATENOLOL 100 MG PO TABS: 100.0000 mg | ORAL_TABLET | Freq: Every day | ORAL | 3 refills | Status: DC

## 2018-01-22 MED ORDER — ALLOPURINOL 300 MG PO TABS: 300.0000 mg | ORAL_TABLET | Freq: Every day | ORAL | 3 refills | Status: DC

## 2018-01-22 NOTE — Assessment & Plan Note (Signed)
Stable, continue present medications.   

## 2018-01-22 NOTE — Addendum Note (Signed)
Addended by: Kathrine Haddock on: 01/22/2018 04:22 PM   Modules accepted: Orders

## 2018-01-22 NOTE — Addendum Note (Signed)
Addended by: Georgina Peer on: 01/22/2018 04:38 PM   Modules accepted: Orders

## 2018-01-22 NOTE — Addendum Note (Signed)
Addended by: Kathrine Haddock on: 01/22/2018 04:48 PM   Modules accepted: Orders

## 2018-01-22 NOTE — Telephone Encounter (Signed)
Copied from [place]. Topic: Quick Communication - See Telephone Encounter >> Jan 22, 2018  2:33 PM Vernona Rieger wrote: CRM for notification. See Telephone encounter for:   01/22/18.  Pt said that she seen Julian Hy this am & she was going to send her something in for pain. She said she is at the pharmacy & they do not have anything.   [place] (N), [place]

## 2018-01-22 NOTE — Telephone Encounter (Signed)
Patient notified about prescription. Patient also needs meloxicam sent to [facility] as well.

## 2018-01-22 NOTE — Telephone Encounter (Signed)
Rx pharmacy change is being taken care of in another phone call.

## 2018-01-22 NOTE — Assessment & Plan Note (Signed)
Last GFR was 86

## 2018-01-22 NOTE — Telephone Encounter (Signed)
Copied from [place]. Topic: Quick Communication - See Telephone Encounter >> Jan 22, 2018  2:33 PM Vernona Rieger wrote: CRM for notification. See Telephone encounter for:   01/22/18.  Pt said that she seen Julian Hy this am & she was going to send her something in for pain. She said she is at the pharmacy & they do not have anything.   [place]), [place] >> Jan 22, 2018  4:20 PM Cleaster Corin, NT wrote: Pt. Calling and stated that medication was sent to the wrong pharmacy. All medications that were prescribed today need to be sent to [facility] on [address]. Pt. Can be reached at [phone] meds. Are zyloprim/tenormin/lioresal/mobic    [place] (N), [place] - [address]  [place] ([place]) [place] [address]  Phone: [phone] Fax: [phone]

## 2018-01-22 NOTE — Assessment & Plan Note (Addendum)
New problem.  Severe neck pain.  GFR is OK so will rx Meclizine and Baclofen.  Refer to Dr. Wynetta Emery [facility]

## 2018-01-22 NOTE — Progress Notes (Signed)
BP 117/67   Pulse 69   Temp 98.3 F (36.8 C) (Oral)   Wt 174 lb 6.4 oz (79.1 kg)   LMP  (LMP Unknown)   SpO2 98%   BMI 30.89 kg/m    Subjective:    Patient ID: Cynthia Hurst, female    DOB: 12-Jul-1935, 83 y.o.   MRN: [id number]  HPI: Cynthia Hurst is a 83 y.o. female  Chief Complaint  Patient presents with  . Hyperlipidemia  . Hypertension  . Neck Pain    pt states she has been having extremely bad neck pain for the past 3 days    Hypertension Using medications without difficulty Average home BPs Not checking   No problems or lightheadedness No chest pain with exertion or shortness of breath No Edema  Hyperlipidemia Using medications without problems: No Muscle aches  Diet compliance:Exercise: Walk on a regular basis  Neck Pain   This is a new problem. Episode onset: 3 days. The problem occurs constantly. The problem has been unchanged. The pain is associated with nothing. The pain is present in the left side, right side and midline. The quality of the pain is described as aching. The pain is severe. Exacerbated by: movement. Pertinent negatives include no chest pain, fever, headaches, leg pain, numbness, pain with swallowing, paresis, photophobia, syncope, tingling, trouble swallowing, visual change, weakness or weight loss. She has tried acetaminophen, heat, ice and NSAIDs for the symptoms. The treatment provided no relief.    Relevant past medical, surgical, family and social history reviewed and updated as indicated. Interim medical history since our last visit reviewed. Allergies and medications reviewed and updated.  Review of Systems  Constitutional: Negative for fever and weight loss.  HENT: Negative for trouble swallowing.   Eyes: Negative for photophobia.  Cardiovascular: Negative for chest pain and syncope.  Musculoskeletal: Positive for neck pain.  Neurological: Negative for tingling, weakness, numbness and headaches.    Per HPI unless  specifically indicated above     Objective:    BP 117/67   Pulse 69   Temp 98.3 F (36.8 C) (Oral)   Wt 174 lb 6.4 oz (79.1 kg)   LMP  (LMP Unknown)   SpO2 98%   BMI 30.89 kg/m   Wt Readings from Last 3 Encounters:  01/22/18 174 lb 6.4 oz (79.1 kg)  01/15/18 177 lb 7 oz (80.5 kg)  11/13/17 177 lb 12.8 oz (80.6 kg)    Physical Exam  Constitutional: She is oriented to person, place, and time. She appears well-developed and well-nourished. No distress.  HENT:  Head: Normocephalic and atraumatic.  Eyes: Conjunctivae and lids are normal. Right eye exhibits no discharge. Left eye exhibits no discharge. No scleral icterus.  Neck: No JVD present. Muscular tenderness present. Carotid bruit is not present. Decreased range of motion present.  Cardiovascular: Normal rate, regular rhythm and normal heart sounds.  Pulmonary/Chest: Effort normal and breath sounds normal.  Abdominal: Normal appearance. There is no splenomegaly or hepatomegaly.  Neurological: She is alert and oriented to person, place, and time.  Skin: Skin is warm, dry and intact. No rash noted. No pallor.  Psychiatric: She has a normal mood and affect. Her behavior is normal. Judgment and thought content normal.    Results for orders placed or performed in visit on 01/15/18  CBC with Differential/Platelet  Result Value Ref Range   WBC 6.9 3.4 - 10.8 x10E3/uL   RBC 4.02 3.77 - 5.28 x10E6/uL   Hemoglobin 12.4 11.1 -  15.9 g/dL   Hematocrit 37.2 34.0 - 46.6 %   MCV 93 79 - 97 fL   MCH 30.8 26.6 - 33.0 pg   MCHC 33.3 31.5 - 35.7 g/dL   RDW 15.2 12.3 - 15.4 %   Platelets 178 150 - 379 x10E3/uL   Neutrophils 65 Not Estab. %   Lymphs 25 Not Estab. %   Monocytes 9 Not Estab. %   Eos 1 Not Estab. %   Basos 0 Not Estab. %   Neutrophils Absolute 4.5 1.4 - 7.0 x10E3/uL   Lymphocytes Absolute 1.8 0.7 - 3.1 x10E3/uL   Monocytes Absolute 0.6 0.1 - 0.9 x10E3/uL   EOS (ABSOLUTE) 0.1 0.0 - 0.4 x10E3/uL   Basophils Absolute 0.0  0.0 - 0.2 x10E3/uL   Immature Granulocytes 0 Not Estab. %   Immature Grans (Abs) 0.0 0.0 - 0.1 x10E3/uL  Comprehensive metabolic panel  Result Value Ref Range   Glucose 116 (H) 65 - 99 mg/dL   BUN 14 8 - 27 mg/dL   Creatinine, Ser 0.76 0.57 - 1.00 mg/dL   GFR calc non Af Amer 74 >59 mL/min/1.73   GFR calc Af Amer 86 >59 mL/min/1.73   BUN/Creatinine Ratio 18 12 - 28   Sodium 141 134 - 144 mmol/L   Potassium 4.1 3.5 - 5.2 mmol/L   Chloride 103 96 - 106 mmol/L   CO2 23 20 - 29 mmol/L   Calcium 10.4 (H) 8.7 - 10.3 mg/dL   Total Protein 7.0 6.0 - 8.5 g/dL   Albumin 4.1 3.5 - 4.7 g/dL   Globulin, Total 2.9 1.5 - 4.5 g/dL   Albumin/Globulin Ratio 1.4 1.2 - 2.2   Bilirubin Total 0.4 0.0 - 1.2 mg/dL   Alkaline Phosphatase 111 39 - 117 IU/L   AST 18 0 - 40 IU/L   ALT 11 0 - 32 IU/L      Assessment & Plan:   Problem List Items Addressed This Visit      Unprioritized   Benign hypertension with chronic kidney disease    Stable, continue present medications.        Relevant Medications   atenolol (TENORMIN) 100 MG tablet   CKD (chronic kidney disease), stage III (HCC)    Last GFR was 86      Neck pain, acute    New problem.  Severe neck pain.  GFR is OK so will rx Meclizine and Baclofen.  Refer to Dr. Wynetta Emery of [facility]      Vitamin B12 deficiency - Primary      Labs done (CMP, CBC) done when seen last week for vertigo  Follow up plan: Return for see Dr Wynetta Emery ASAP for [facility].

## 2018-01-22 NOTE — Telephone Encounter (Signed)
Routing to provider. Please resend baclofen to [facility].

## 2018-01-22 NOTE — Telephone Encounter (Signed)
Patient states [facility] to [facility]. Patient states that the other two rxs [facility]. Please send pain RX ASAP as patient states that she is in pain.

## 2018-01-25 ENCOUNTER — Ambulatory Visit (INDEPENDENT_AMBULATORY_CARE_PROVIDER_SITE_OTHER): Payer: Medicare Other | Admitting: Family Medicine

## 2018-01-25 ENCOUNTER — Telehealth: Payer: Self-pay | Admitting: Family Medicine

## 2018-01-25 ENCOUNTER — Encounter: Payer: Self-pay | Admitting: Family Medicine

## 2018-01-25 VITALS — BP 102/62 | HR 64 | Wt 175.1 lb

## 2018-01-25 DIAGNOSIS — M542 Cervicalgia: Secondary | ICD-10-CM

## 2018-01-25 MED ORDER — CYCLOBENZAPRINE HCL 10 MG PO TABS: 10.0000 mg | ORAL_TABLET | Freq: Every day | ORAL | 1 refills | Status: DC

## 2018-01-25 NOTE — Progress Notes (Signed)
BP 102/62 (BP Location: Left Arm, Patient Position: Sitting, Cuff Size: Normal)   Pulse 64   Wt 175 lb 2 oz (79.4 kg)   LMP  (LMP Unknown)   SpO2 95%   BMI 31.02 kg/m    Subjective:    Patient ID: Cynthia Hurst, female    DOB: 02-02-1935, 83 y.o.   MRN: [id number]  HPI: Cynthia Hurst is a 83 y.o. female  Chief Complaint  Patient presents with  . Neck Pain    pain in both sides of neck.   Seen today at the request of her PCP, Kathrine Haddock, FNP for evaluation of neck pain. This has been going on for about 2 weeks. Had pain going across the front of her neck like someone was choking her. She notes that she tried heat and that didn't help, Tried some salves that didn't help. Saw cheryl on Monday and was supposed to be given some muscle relaxers but they were sent to the mail order, so she didn't get them, but she wasn't sure if she even needed them. She notes that she has been taking meclizine and meloxicam and is feeling much better. Able to turn her head now. No numbness or tingling. Dizziness is much better. No other concerns or complaints at this time.   Relevant past medical, surgical, family and social history reviewed and updated as indicated. Interim medical history since our last visit reviewed. Allergies and medications reviewed and updated.  Review of Systems  Constitutional: Negative.   Respiratory: Negative.   Cardiovascular: Negative.   Musculoskeletal: Positive for myalgias, neck pain and neck stiffness. Negative for arthralgias, back pain, gait problem and joint swelling.  Skin: Negative.   Neurological: Negative.   Psychiatric/Behavioral: Negative.     Per HPI unless specifically indicated above     Objective:    BP 102/62 (BP Location: Left Arm, Patient Position: Sitting, Cuff Size: Normal)   Pulse 64   Wt 175 lb 2 oz (79.4 kg)   LMP  (LMP Unknown)   SpO2 95%   BMI 31.02 kg/m   Wt Readings from Last 3 Encounters:  01/25/18 175 lb 2 oz (79.4 kg)    01/22/18 174 lb 6.4 oz (79.1 kg)  01/15/18 177 lb 7 oz (80.5 kg)    Physical Exam  Constitutional: She is oriented to person, place, and time. She appears well-developed and well-nourished. No distress.  HENT:  Head: Normocephalic and atraumatic.  Right Ear: Hearing and external ear normal.  Left Ear: Hearing and external ear normal.  Nose: Nose normal.  Mouth/Throat: Oropharynx is clear and moist. No oropharyngeal exudate.  Eyes: Conjunctivae, EOM and lids are normal. Pupils are equal, round, and reactive to light. Right eye exhibits no discharge. Left eye exhibits no discharge. No scleral icterus.  Cardiovascular: Normal rate, regular rhythm, normal heart sounds and intact distal pulses. Exam reveals no gallop and no friction rub.  No murmur heard. Pulmonary/Chest: Effort normal and breath sounds normal. No respiratory distress. She has no wheezes. She has no rales. She exhibits no tenderness.  Musculoskeletal: Normal range of motion. She exhibits no edema, tenderness or deformity.  Neurological: She is alert and oriented to person, place, and time.  Skin: Skin is warm, dry and intact. No rash noted. She is not diaphoretic. No erythema. No pallor.  Psychiatric: She has a normal mood and affect. Her speech is normal and behavior is normal. Judgment and thought content normal. Cognition and memory are normal.  Nursing  note and vitals reviewed. Neck Exam: Hypertonic SCM bilaterally, anterior scalenes hypertonic bilaterally, posterior paraspinals normal, normal movement of cervical bones    Tenderness to Palpation: no    Midline cervical spine: no    Paraspinal neck musculature: no    Trapezius: no    Sternocleidomastoid: no     Range of Motion:     Flexion: Normal    Extension: Normal    Lateral rotation: Normal    Lateral bending: Normal     Neuro Examination: Upper extremity DTRs normal & symmetric.  Strength and sensation intact.    Special Tests:     Spurling test: negative     Results for orders placed or performed in visit on 01/15/18  CBC with Differential/Platelet  Result Value Ref Range   WBC 6.9 3.4 - 10.8 x10E3/uL   RBC 4.02 3.77 - 5.28 x10E6/uL   Hemoglobin 12.4 11.1 - 15.9 g/dL   Hematocrit 37.2 34.0 - 46.6 %   MCV 93 79 - 97 fL   MCH 30.8 26.6 - 33.0 pg   MCHC 33.3 31.5 - 35.7 g/dL   RDW 15.2 12.3 - 15.4 %   Platelets 178 150 - 379 x10E3/uL   Neutrophils 65 Not Estab. %   Lymphs 25 Not Estab. %   Monocytes 9 Not Estab. %   Eos 1 Not Estab. %   Basos 0 Not Estab. %   Neutrophils Absolute 4.5 1.4 - 7.0 x10E3/uL   Lymphocytes Absolute 1.8 0.7 - 3.1 x10E3/uL   Monocytes Absolute 0.6 0.1 - 0.9 x10E3/uL   EOS (ABSOLUTE) 0.1 0.0 - 0.4 x10E3/uL   Basophils Absolute 0.0 0.0 - 0.2 x10E3/uL   Immature Granulocytes 0 Not Estab. %   Immature Grans (Abs) 0.0 0.0 - 0.1 x10E3/uL  Comprehensive metabolic panel  Result Value Ref Range   Glucose 116 (H) 65 - 99 mg/dL   BUN 14 8 - 27 mg/dL   Creatinine, Ser 0.76 0.57 - 1.00 mg/dL   GFR calc non Af Amer 74 >59 mL/min/1.73   GFR calc Af Amer 86 >59 mL/min/1.73   BUN/Creatinine Ratio 18 12 - 28   Sodium 141 134 - 144 mmol/L   Potassium 4.1 3.5 - 5.2 mmol/L   Chloride 103 96 - 106 mmol/L   CO2 23 20 - 29 mmol/L   Calcium 10.4 (H) 8.7 - 10.3 mg/dL   Total Protein 7.0 6.0 - 8.5 g/dL   Albumin 4.1 3.5 - 4.7 g/dL   Globulin, Total 2.9 1.5 - 4.5 g/dL   Albumin/Globulin Ratio 1.4 1.2 - 2.2   Bilirubin Total 0.4 0.0 - 1.2 mg/dL   Alkaline Phosphatase 111 39 - 117 IU/L   AST 18 0 - 40 IU/L   ALT 11 0 - 32 IU/L      Assessment & Plan:   Problem List Items Addressed This Visit      Other   Neck pain, acute - Primary    Neck pain almost entirely resolved. Will get her on some flexeril to cover the last bit of it. Stretches given today. Call if getting worse again.           Follow up plan: Return if symptoms worsen or fail to improve.

## 2018-01-25 NOTE — Patient Instructions (Addendum)
Cervical Strain and Sprain Rehab Ask your health care provider which exercises are safe for you. Do exercises exactly as told by your health care provider and adjust them as directed. It is normal to feel mild stretching, pulling, tightness, or discomfort as you do these exercises, but you should stop right away if you feel sudden pain or your pain gets worse.Do not begin these exercises until told by your health care provider. Stretching and range of motion exercises These exercises warm up your muscles and joints and improve the movement and flexibility of your neck. These exercises also help to relieve pain, numbness, and tingling. Exercise A: Cervical side bend  1. Using good posture, sit on a stable chair or stand up. 2. Without moving your shoulders, slowly tilt your left / right ear to your shoulder until you feel a stretch in your neck muscles. You should be looking straight ahead. 3. Hold for __________ seconds. 4. Repeat with the other side of your neck. Repeat __________ times. Complete this exercise __________ times a day. Exercise B: Cervical rotation  1. Using good posture, sit on a stable chair or stand up. 2. Slowly turn your head to the side as if you are looking over your left / right shoulder. ? Keep your eyes level with the ground. ? Stop when you feel a stretch along the side and the back of your neck. 3. Hold for __________ seconds. 4. Repeat this by turning to your other side. Repeat __________ times. Complete this exercise __________ times a day. Exercise C: Thoracic extension and pectoral stretch 1. Roll a towel or a small blanket so it is about 4 inches (10 cm) in diameter. 2. Lie down on your back on a firm surface. 3. Put the towel lengthwise, under your spine in the middle of your back. It should not be not under your shoulder blades. The towel should line up with your spine from your middle back to your lower back. 4. Put your hands behind your head and let your  elbows fall out to your sides. 5. Hold for __________ seconds. Repeat __________ times. Complete this exercise __________ times a day. Strengthening exercises These exercises build strength and endurance in your neck. Endurance is the ability to use your muscles for a long time, even after your muscles get tired. Exercise D: Upper cervical flexion, isometric 1. Lie on your back with a thin pillow behind your head and a small rolled-up towel under your neck. 2. Gently tuck your chin toward your chest and nod your head down to look toward your feet. Do not lift your head off the pillow. 3. Hold for __________ seconds. 4. Release the tension slowly. Relax your neck muscles completely before you repeat this exercise. Repeat __________ times. Complete this exercise __________ times a day. Exercise E: Cervical extension, isometric  1. Stand about 6 inches (15 cm) away from a wall, with your back facing the wall. 2. Place a soft object, about 6-8 inches (15-20 cm) in diameter, between the back of your head and the wall. A soft object could be a small pillow, a ball, or a folded towel. 3. Gently tilt your head back and press into the soft object. Keep your jaw and forehead relaxed. 4. Hold for __________ seconds. 5. Release the tension slowly. Relax your neck muscles completely before you repeat this exercise. Repeat __________ times. Complete this exercise __________ times a day. Posture and body mechanics  Body mechanics refers to the movements and positions of   your body while you do your daily activities. Posture is part of body mechanics. Good posture and healthy body mechanics can help to relieve stress in your body's tissues and joints. Good posture means that your spine is in its natural S-curve position (your spine is neutral), your shoulders are pulled back slightly, and your head is not tipped forward. The following are general guidelines for applying improved posture and body mechanics to  your everyday activities. Standing  When standing, keep your spine neutral and keep your feet about hip-width apart. Keep a slight bend in your knees. Your ears, shoulders, and hips should line up.  When you do a task in which you stand in one place for a long time, place one foot up on a stable object that is 2-4 inches (5-10 cm) high, such as a footstool. This helps keep your spine neutral. Sitting   When sitting, keep your spine neutral and your keep feet flat on the floor. Use a footrest, if necessary, and keep your thighs parallel to the floor. Avoid rounding your shoulders, and avoid tilting your head forward.  When working at a desk or a computer, keep your desk at a height where your hands are slightly lower than your elbows. Slide your chair under your desk so you are close enough to maintain good posture.  When working at a computer, place your monitor at a height where you are looking straight ahead and you do not have to tilt your head forward or downward to look at the screen. Resting When lying down and resting, avoid positions that are most painful for you. Try to support your neck in a neutral position. You can use a contour pillow or a small rolled-up towel. Your pillow should support your neck but not push on it. This information is not intended to replace advice given to you by your health care provider. Make sure you discuss any questions you have with your health care provider. Document Released: 11/28/2005 Document Revised: 08/04/2016 Document Reviewed: 11/04/2015 Elsevier Interactive Patient Education  2018 [employer]  

## 2018-01-25 NOTE — Telephone Encounter (Signed)
Copied from [place]. Topic: Quick Communication - See Telephone Encounter >> Jan 25, 2018  4:05 PM Robina Ade, Helene Kelp D wrote: CRM for notification. See Telephone encounter for: 01/25/18. Patient called and said that the medication cyclobenzaprine (FLEXERIL) 10 MG tablet that was sent for her is too much and she would like to talk to Dr. Wynetta Emery or her CMA about this and possible changing it. Please call patient back, thanks.

## 2018-01-25 NOTE — Assessment & Plan Note (Signed)
Neck pain almost entirely resolved. Will get her on some flexeril to cover the last bit of it. Stretches given today. Call if getting worse again.

## 2018-01-25 NOTE — Telephone Encounter (Signed)
Patient notified that she needs to pay cash and not run it through insurance.

## 2018-02-09 ENCOUNTER — Ambulatory Visit (INDEPENDENT_AMBULATORY_CARE_PROVIDER_SITE_OTHER): Payer: Medicare Other | Admitting: Family Medicine

## 2018-02-09 ENCOUNTER — Encounter: Payer: Self-pay | Admitting: Family Medicine

## 2018-02-09 VITALS — BP 144/76 | HR 77 | Temp 97.6°F | Wt 176.1 lb

## 2018-02-09 DIAGNOSIS — M2550 Pain in unspecified joint: Secondary | ICD-10-CM

## 2018-02-09 MED ORDER — TRAMADOL HCL 50 MG PO TABS: 50.0000 mg | ORAL_TABLET | Freq: Three times a day (TID) | ORAL | 0 refills | Status: DC | PRN

## 2018-02-09 MED ORDER — PREDNISONE 10 MG PO TABS: ORAL_TABLET | ORAL | 0 refills | Status: DC

## 2018-02-09 NOTE — Patient Instructions (Signed)
Stop your meloxicam while taking the prednisone that you were just given. Once you complete that course, you can resume taking the meloxicam. You can take tylenol while you are off of the meloxicam (along with the prednisone).

## 2018-02-09 NOTE — Progress Notes (Signed)
BP (!) 144/76 (BP Location: Left Arm, Patient Position: Sitting, Cuff Size: Normal)   Pulse 77   Temp 97.6 F (36.4 C) (Oral)   Wt 176 lb 1.6 oz (79.9 kg)   LMP  (LMP Unknown)   SpO2 96%   BMI 31.19 kg/m    Subjective:    Patient ID: Cynthia Hurst, female    DOB: Feb 14, 1935, 83 y.o.   MRN: [id number]  HPI: Cynthia Hurst is a 83 y.o. female  Chief Complaint  Patient presents with  . Leg Pain    x's 1 month, has became worse. Bilateral.  . Shoulder Pain    Bilateral.   Pt here today with about a month of b/l posterior leg pain and shoulder pain. No known injury, states it feels like muscle and joint soreness. Has never happened to her to this extent. Denies joint swelling, redness, fevers. Takes meloxicam and flexeril daily with no relief. Does have a hx of OA but no other known joint issues.   Relevant past medical, surgical, family and social history reviewed and updated as indicated. Interim medical history since our last visit reviewed. Allergies and medications reviewed and updated.  Review of Systems  Per HPI unless specifically indicated above     Objective:    BP (!) 144/76 (BP Location: Left Arm, Patient Position: Sitting, Cuff Size: Normal)   Pulse 77   Temp 97.6 F (36.4 C) (Oral)   Wt 176 lb 1.6 oz (79.9 kg)   LMP  (LMP Unknown)   SpO2 96%   BMI 31.19 kg/m   Wt Readings from Last 3 Encounters:  02/09/18 176 lb 1.6 oz (79.9 kg)  01/25/18 175 lb 2 oz (79.4 kg)  01/22/18 174 lb 6.4 oz (79.1 kg)    Physical Exam  Constitutional: She appears well-developed and well-nourished. No distress.  HENT:  Head: Atraumatic.  Eyes: Conjunctivae are normal. Pupils are equal, round, and reactive to light.  Neck: Normal range of motion. Neck supple.  Cardiovascular: Normal rate and normal heart sounds.  Pulmonary/Chest: Effort normal and breath sounds normal. No respiratory distress.  Musculoskeletal: She exhibits tenderness (Mild ttp over deltoid and hamstring  muscles b/l). She exhibits no edema or deformity.  Neurological: She is alert. She exhibits normal muscle tone. Coordination normal.  Skin: Skin is warm and dry. No rash noted. No erythema.  Psychiatric: She has a normal mood and affect. Her behavior is normal.  Nursing note and vitals reviewed.   Results for orders placed or performed in visit on 02/09/18  Sed Rate (ESR)  Result Value Ref Range   Sed Rate 21 0 - 40 mm/hr  Rheumatoid Factor  Result Value Ref Range   Rhuematoid fact SerPl-aCnc 10.3 0.0 - 13.9 IU/mL  ANA w/Reflex  Result Value Ref Range   Anit Nuclear Antibody(ANA) Positive (A) Negative  ENA+DNA/DS+Sjorgen's  Result Value Ref Range   dsDNA Ab <1 0 - 9 IU/mL   ENA RNP Ab <0.2 0.0 - 0.9 AI   ENA SM Ab Ser-aCnc <0.2 0.0 - 0.9 AI   ENA SSA (RO) Ab 0.3 0.0 - 0.9 AI   ENA SSB (LA) Ab <0.2 0.0 - 0.9 AI   See below: Comment       Assessment & Plan:   Problem List Items Addressed This Visit    None    Visit Diagnoses    Arthralgia, unspecified joint    -  Primary   Relevant Orders   Sed Rate (ESR) (  Completed)   Rheumatoid Factor (Completed)   ANA w/Reflex (Completed)    Not acting like a classic OA flare, will get rheumatologic labs to r/o polymyalgia rheumatica, lupus, RA, etc. May also be a late presentation of fibromyalgia. Will start prednisone in addition to her flexeril. Can take tylenol prn during prednisone course and then go back to her meloxicam. F/u depending on lab results.    Follow up plan: Return if symptoms worsen or fail to improve.

## 2018-02-10 LAB — ENA+DNA/DS+SJORGEN'S
ENA RNP Ab: <0.2 AI (ref 0.0–0.9)
ENA SM Ab Ser-aCnc: <0.2 AI (ref 0.0–0.9)
ENA SSA (RO) Ab: 0.3 AI (ref 0.0–0.9)
dsDNA Ab: <1 IU/mL (ref 0–9)

## 2018-02-10 LAB — SEDIMENTATION RATE: SED RATE: 21 mm/h (ref 0–40)

## 2018-02-10 LAB — ANA W/REFLEX: Anti Nuclear Antibody(ANA): POSITIVE — AB

## 2018-02-10 LAB — RHEUMATOID FACTOR: Rhuematoid fact SerPl-aCnc: 10.3 IU/mL (ref 0.0–13.9)

## 2018-02-12 ENCOUNTER — Other Ambulatory Visit: Payer: Self-pay | Admitting: Family Medicine

## 2018-02-12 DIAGNOSIS — M2550 Pain in unspecified joint: Secondary | ICD-10-CM

## 2018-02-19 ENCOUNTER — Other Ambulatory Visit: Payer: Self-pay | Admitting: Unknown Physician Specialty

## 2018-03-12 DIAGNOSIS — R778 Other specified abnormalities of plasma proteins: Secondary | ICD-10-CM | POA: Diagnosis not present

## 2018-03-12 DIAGNOSIS — R768 Other specified abnormal immunological findings in serum: Secondary | ICD-10-CM | POA: Diagnosis not present

## 2018-03-12 DIAGNOSIS — M2550 Pain in unspecified joint: Secondary | ICD-10-CM | POA: Diagnosis not present

## 2018-03-21 ENCOUNTER — Other Ambulatory Visit: Payer: Self-pay

## 2018-03-21 ENCOUNTER — Encounter: Payer: Self-pay | Admitting: Oncology

## 2018-03-21 ENCOUNTER — Inpatient Hospital Stay: Payer: Medicare Other

## 2018-03-21 ENCOUNTER — Inpatient Hospital Stay: Payer: Medicare Other | Attending: Oncology | Admitting: Oncology

## 2018-03-21 DIAGNOSIS — N179 Acute kidney failure, unspecified: Secondary | ICD-10-CM | POA: Insufficient documentation

## 2018-03-21 DIAGNOSIS — M79652 Pain in left thigh: Secondary | ICD-10-CM | POA: Insufficient documentation

## 2018-03-21 DIAGNOSIS — D472 Monoclonal gammopathy: Secondary | ICD-10-CM | POA: Insufficient documentation

## 2018-03-21 DIAGNOSIS — M7989 Other specified soft tissue disorders: Secondary | ICD-10-CM | POA: Diagnosis not present

## 2018-03-21 DIAGNOSIS — R7989 Other specified abnormal findings of blood chemistry: Secondary | ICD-10-CM

## 2018-03-21 DIAGNOSIS — I129 Hypertensive chronic kidney disease with stage 1 through stage 4 chronic kidney disease, or unspecified chronic kidney disease: Secondary | ICD-10-CM | POA: Diagnosis not present

## 2018-03-21 DIAGNOSIS — N183 Chronic kidney disease, stage 3 (moderate): Secondary | ICD-10-CM | POA: Diagnosis not present

## 2018-03-21 DIAGNOSIS — M79651 Pain in right thigh: Secondary | ICD-10-CM | POA: Diagnosis not present

## 2018-03-21 DIAGNOSIS — Z96653 Presence of artificial knee joint, bilateral: Secondary | ICD-10-CM | POA: Insufficient documentation

## 2018-03-21 LAB — CBC WITH DIFFERENTIAL/PLATELET
BASOS PCT: 0 %
Basophils Absolute: 0 10*3/uL (ref 0–0.1)
EOS ABS: 0 10*3/uL (ref 0–0.7)
EOS PCT: 0 %
HCT: 37.5 % (ref 35.0–47.0)
Hemoglobin: 12.8 g/dL (ref 12.0–16.0)
LYMPHS ABS: 1.4 10*3/uL (ref 1.0–3.6)
Lymphocytes Relative: 15 %
MCH: 31.8 pg (ref 26.0–34.0)
MCHC: 34.1 g/dL (ref 32.0–36.0)
MCV: 93.3 fL (ref 80.0–100.0)
MONOS PCT: 3 %
Monocytes Absolute: 0.3 10*3/uL (ref 0.2–0.9)
NEUTROS PCT: 82 %
Neutro Abs: 7.6 10*3/uL — ABNORMAL HIGH (ref 1.4–6.5)
PLATELETS: 199 10*3/uL (ref 150–440)
RBC: 4.02 MIL/uL (ref 3.80–5.20)
RDW: 15.2 % — ABNORMAL HIGH (ref 11.5–14.5)
WBC: 9.4 10*3/uL (ref 3.6–11.0)

## 2018-03-21 LAB — COMPREHENSIVE METABOLIC PANEL
ALK PHOS: 85 U/L (ref 38–126)
ALT: 11 U/L — ABNORMAL LOW (ref 14–54)
AST: 19 U/L (ref 15–41)
Albumin: 3.9 g/dL (ref 3.5–5.0)
Anion gap: 7 (ref 5–15)
BUN: 20 mg/dL (ref 6–20)
CALCIUM: 9.9 mg/dL (ref 8.9–10.3)
CO2: 25 mmol/L (ref 22–32)
CREATININE: 1.04 mg/dL — AB (ref 0.44–1.00)
Chloride: 104 mmol/L (ref 101–111)
GFR, EST AFRICAN AMERICAN: 57 mL/min — AB (ref >60)
GFR, EST NON AFRICAN AMERICAN: 49 mL/min — AB (ref >60)
Glucose, Bld: 118 mg/dL — ABNORMAL HIGH (ref 65–99)
Potassium: 4 mmol/L (ref 3.5–5.1)
Sodium: 136 mmol/L (ref 135–145)
Total Bilirubin: 0.9 mg/dL (ref 0.3–1.2)
Total Protein: 7.4 g/dL (ref 6.5–8.1)

## 2018-03-21 LAB — LACTATE DEHYDROGENASE: LDH: 136 U/L (ref 98–192)

## 2018-03-21 NOTE — Progress Notes (Addendum)
Hematology/Oncology Consult note [facility] [phone] [phone]   Patient Care Team: Kathrine Haddock, NP as PCP - General (Nurse Practitioner)  REFERRING PROVIDER: Dr.Bock Rheymatology CHIEF COMPLAINTS/PURPOSE OF CONSULTATION:  Evaluation of hypercalcemia.  HISTORY OF PRESENTING ILLNESS:  Cynthia Hurst is a  83 y.o.  female with PMH listed below who was referred to me for evaluation of hypercalcemia.  Patient was referred to see a see rheumatology for evaluation of positive ANA, joint pain.  Lab workup reviewed hypercalcemia of 11, normal PTH, normal 25-hydroxy vitamin D level, normal TSH, SPEP showed  monoclonal spike of 0.4.  Immunofixation showed elevated IgA monoclonal protein with kappa light chain specificity. Patient reports feeling well, she denies any fatigue, weight loss, back pain history of fracture.  She has bilateral knee replacement reports having posterior thigh pain bilaterally. Patient denies taking any calcium supplements.  Never smoker  Review of Systems  Constitutional: Negative for chills, fever, malaise/fatigue and weight loss.  HENT: Negative for nosebleeds.   Eyes: Negative for photophobia and pain.  Respiratory: Negative for cough and hemoptysis.   Cardiovascular: Negative for chest pain and palpitations.  Gastrointestinal: Negative for constipation, heartburn and nausea.  Genitourinary: Negative for dysuria and urgency.  Musculoskeletal: Positive for joint pain.  Skin: Negative for rash.  Neurological: Negative for dizziness and sensory change.  Endo/Heme/Allergies: Does not bruise/bleed easily.  Psychiatric/Behavioral: Negative for depression and substance abuse.    MEDICAL HISTORY:  Past Medical History:  Diagnosis Date  . Chronic kidney disease   . GERD (gastroesophageal reflux disease)   . Gout   . Hypertension   . Hypopotassemia   . Osteoarthritis   . Osteoporosis    LUMBAR SPINE  . Vitamin B  12 deficiency   . Vitamin D deficiency     SURGICAL HISTORY: Past Surgical History:  Procedure Laterality Date  . ABDOMINAL HYSTERECTOMY     PARTIAL  . JOINT REPLACEMENT  2010,2011   KNEE    SOCIAL HISTORY: Social History   Socioeconomic History  . Marital status: Married    Spouse name: Not on file  . Number of children: Not on file  . Years of education: Not on file  . Highest education level: Not on file  Occupational History  . Not on file  Social Needs  . Financial resource strain: Not on file  . Food insecurity:    Worry: Not on file    Inability: Not on file  . Transportation needs:    Medical: Not on file    Non-medical: Not on file  Tobacco Use  . Smoking status: Never Smoker  . Smokeless tobacco: Never Used  Substance and Sexual Activity  . Alcohol use: No  . Drug use: No  . Sexual activity: Yes  Lifestyle  . Physical activity:    Days per week: Not on file    Minutes per session: Not on file  . Stress: Not on file  Relationships  . Social connections:    Talks on phone: Not on file    Gets together: Not on file    Attends religious service: Not on file    Active member of club or organization: Not on file    Attends meetings of clubs or organizations: Not on file    Relationship status: Not on file  . Intimate partner violence:    Fear of current or ex partner: Not on file    Emotionally abused: Not on file    Physically abused:  Not on file    Forced sexual activity: Not on file  Other Topics Concern  . Not on file  Social History Narrative  . Not on file    FAMILY HISTORY: Family History  Problem Relation Age of Onset  . Heart disease Mother   . Cancer Father   . Mental illness Father   . Breast cancer Neg Hx     ALLERGIES:  has No Known Allergies.  MEDICATIONS:  Current Outpatient Medications  Medication Sig Dispense Refill  . allopurinol (ZYLOPRIM) 300 MG tablet Take 1 tablet (300 mg total) by mouth daily. 90 tablet 3  .  atenolol (TENORMIN) 100 MG tablet Take 1 tablet (100 mg total) by mouth daily. 90 tablet 3  . cholecalciferol (VITAMIN D) 1000 UNITS tablet Take 1,000 Units by mouth daily.    Marland Kitchen NIFEdipine (PROCARDIA-XL/ADALAT CC) 60 MG 24 hr tablet TAKE ONE TABLET BY MOUTH DAILY 90 tablet 0  . omeprazole (PRILOSEC) 20 MG capsule Take 1 capsule (20 mg total) by mouth daily. Substitute for Lansoprazole 90 capsule 0  . potassium chloride SA (K-DUR,KLOR-CON) 20 MEQ tablet TAKE ONE TABLET BY MOUTH DAILY 90 tablet 2  . predniSONE (DELTASONE) 10 MG tablet Take 6 tabs day one, 5 tabs day two, 4 tabs day three, etc. 21 tablet 0  . raloxifene (EVISTA) 60 MG tablet TAKE ONE TABLET BY MOUTH DAILY 90 tablet 2  . vitamin B-12 (CYANOCOBALAMIN) 1000 MCG tablet Take 1,000 mcg by mouth daily.    . cyclobenzaprine (FLEXERIL) 10 MG tablet Take 1 tablet (10 mg total) by mouth at bedtime. (Patient not taking: Reported on 03/21/2018) 30 tablet 1  . meloxicam (MOBIC) 15 MG tablet Take 1 tablet (15 mg total) by mouth daily. (Patient not taking: Reported on 03/21/2018) 30 tablet 0  . traMADol (ULTRAM) 50 MG tablet Take 1 tablet (50 mg total) by mouth every 8 (eight) hours as needed. (Patient not taking: Reported on 03/21/2018) 6 tablet 0   No current facility-administered medications for this visit.      PHYSICAL EXAMINATION: ECOG PERFORMANCE STATUS: 0 - Asymptomatic Vitals:   03/21/18 1137  BP: 103/63  Pulse: (!) 57  Resp: 18  Temp: (!) 96.5 F (35.8 C)   Filed Weights   03/21/18 1137  Weight: 178 lb 3 oz (80.8 kg)    Physical Exam   LABORATORY DATA:  I have reviewed the data as listed Lab Results  Component Value Date   WBC 9.4 03/21/2018   HGB 12.8 03/21/2018   HCT 37.5 03/21/2018   MCV 93.3 03/21/2018   PLT 199 03/21/2018   Recent Labs    07/12/17 1345 01/15/18 1121 03/21/18 1255  NA 143 141 136  K 4.2 4.1 4.0  CL 106 103 104  CO2 _0 GLUCOSE 84 116* 118*  BUN _1 CREATININE 0.95 0.76 1.04*   CALCIUM 10.0 10.4* 9.9  GFRNONAA 57* 74 49*  GFRAA 65 86 57*  PROT 6.8 7.0 7.4  ALBUMIN 4.1 4.1 3.9  AST _2 ALT 8 11 11*  ALKPHOS 107 111 85  BILITOT 0.6 0.4 0.9       ASSESSMENT & PLAN:  1. Hypercalcemia   2. MGUS (monoclonal gammopathy of unknown significance)   3. Elevated serum creatinine    Lab results were discussed with patient.  Patient has hypercalcemia, with a normal PTH, need to  rule out hypercalcemia secondary to, underlying malignancy including multiple myeloma, solid tumor, etc.  We will obtain CBC, repeat CMP, check UPEP with immediate fixation, free light chain ratio, PTH peptid,   LDH, obtain skeletal survey. Advised patient not to take any calcium supplements at this point Patient has  elevated creatinine, which is slightly higher than her baseline.  Advised to stop taking mobic and will schedule IV fluid session   All questions were answered. The patient knows to call the clinic with any problems questions or concerns.  Return of visit: 10 days.   thank you for this kind referral and the opportunity to participate in the care of this patient. A copy of today's note is routed to referring provider    Earlie Server, MD, PhD Hematology Oncology [facility] Pager- [phone] 03/21/2018

## 2018-03-21 NOTE — Addendum Note (Signed)
Addended by: Earlie Server on: 03/21/2018 10:40 PM   Modules accepted: Orders

## 2018-03-21 NOTE — Progress Notes (Signed)
New patient referred for hypercalcemia.  Pt states she is feeling well and "alittle confused why I'm here".  Pt husband is with her on visit today.

## 2018-03-22 ENCOUNTER — Other Ambulatory Visit: Payer: Self-pay

## 2018-03-22 ENCOUNTER — Other Ambulatory Visit: Payer: Self-pay | Admitting: *Deleted

## 2018-03-22 ENCOUNTER — Ambulatory Visit
Admission: RE | Admit: 2018-03-22 | Discharge: 2018-03-22 | Disposition: A | Discharge status: Home / Self Care | Payer: Medicare Other | Source: Ambulatory Visit | Attending: Oncology | Admitting: Oncology

## 2018-03-22 DIAGNOSIS — N183 Chronic kidney disease, stage 3 unspecified: Secondary | ICD-10-CM

## 2018-03-22 DIAGNOSIS — I700 Atherosclerosis of aorta: Secondary | ICD-10-CM | POA: Insufficient documentation

## 2018-03-22 DIAGNOSIS — I517 Cardiomegaly: Secondary | ICD-10-CM | POA: Diagnosis not present

## 2018-03-22 LAB — KAPPA/LAMBDA LIGHT CHAINS
KAPPA, LAMDA LIGHT CHAIN RATIO: 0.97 (ref 0.26–1.65)
Kappa free light chain: 23.3 mg/L — ABNORMAL HIGH (ref 3.3–19.4)
Lambda free light chains: 23.9 mg/L (ref 5.7–26.3)

## 2018-03-23 ENCOUNTER — Encounter: Payer: Self-pay | Admitting: Oncology

## 2018-03-23 LAB — COMP PANEL: LEUKEMIA/LYMPHOMA

## 2018-03-26 ENCOUNTER — Inpatient Hospital Stay: Payer: Medicare Other

## 2018-03-26 DIAGNOSIS — M353 Polymyalgia rheumatica: Secondary | ICD-10-CM | POA: Diagnosis not present

## 2018-03-26 DIAGNOSIS — M2550 Pain in unspecified joint: Secondary | ICD-10-CM | POA: Diagnosis not present

## 2018-03-26 DIAGNOSIS — R768 Other specified abnormal immunological findings in serum: Secondary | ICD-10-CM | POA: Diagnosis not present

## 2018-03-29 ENCOUNTER — Inpatient Hospital Stay: Payer: Medicare Other

## 2018-03-29 DIAGNOSIS — N183 Chronic kidney disease, stage 3 unspecified: Secondary | ICD-10-CM

## 2018-03-29 DIAGNOSIS — M79652 Pain in left thigh: Secondary | ICD-10-CM | POA: Diagnosis not present

## 2018-03-29 DIAGNOSIS — M7989 Other specified soft tissue disorders: Secondary | ICD-10-CM | POA: Diagnosis not present

## 2018-03-29 DIAGNOSIS — I129 Hypertensive chronic kidney disease with stage 1 through stage 4 chronic kidney disease, or unspecified chronic kidney disease: Secondary | ICD-10-CM | POA: Diagnosis not present

## 2018-03-29 DIAGNOSIS — D472 Monoclonal gammopathy: Secondary | ICD-10-CM | POA: Diagnosis not present

## 2018-03-29 LAB — CBC WITH DIFFERENTIAL/PLATELET
Basophils Absolute: 0 10*3/uL (ref 0–0.1)
Basophils Relative: 1 %
Eosinophils Absolute: 0 10*3/uL (ref 0–0.7)
Eosinophils Relative: 0 %
HCT: 37.5 % (ref 35.0–47.0)
Hemoglobin: 12.6 g/dL (ref 12.0–16.0)
Lymphocytes Relative: 18 %
Lymphs Abs: 1.9 10*3/uL (ref 1.0–3.6)
MCH: 31.5 pg (ref 26.0–34.0)
MCHC: 33.5 g/dL (ref 32.0–36.0)
MCV: 93.9 fL (ref 80.0–100.0)
Monocytes Absolute: 0.6 10*3/uL (ref 0.2–0.9)
Monocytes Relative: 6 %
Neutro Abs: 7.9 10*3/uL — ABNORMAL HIGH (ref 1.4–6.5)
Neutrophils Relative %: 75 %
Platelets: 181 10*3/uL (ref 150–440)
RBC: 3.99 MIL/uL (ref 3.80–5.20)
RDW: 15.6 % — ABNORMAL HIGH (ref 11.5–14.5)
WBC: 10.5 10*3/uL (ref 3.6–11.0)

## 2018-03-30 ENCOUNTER — Other Ambulatory Visit: Payer: Self-pay | Admitting: Family Medicine

## 2018-03-30 LAB — PTH-RELATED PEPTIDE: PTH-related peptide: <2 pmol/L

## 2018-04-02 ENCOUNTER — Inpatient Hospital Stay (HOSPITAL_BASED_OUTPATIENT_CLINIC_OR_DEPARTMENT_OTHER): Payer: Medicare Other | Admitting: Oncology

## 2018-04-02 ENCOUNTER — Other Ambulatory Visit: Payer: Self-pay

## 2018-04-02 ENCOUNTER — Encounter: Payer: Self-pay | Admitting: Oncology

## 2018-04-02 ENCOUNTER — Telehealth: Payer: Self-pay | Admitting: Oncology

## 2018-04-02 VITALS — BP 97/50 | HR 65 | Temp 96.6°F | Resp 18 | Wt 177.8 lb

## 2018-04-02 DIAGNOSIS — I219 Acute myocardial infarction, unspecified: Secondary | ICD-10-CM | POA: Diagnosis not present

## 2018-04-02 DIAGNOSIS — M7989 Other specified soft tissue disorders: Secondary | ICD-10-CM | POA: Diagnosis not present

## 2018-04-02 DIAGNOSIS — Z96653 Presence of artificial knee joint, bilateral: Secondary | ICD-10-CM

## 2018-04-02 DIAGNOSIS — N183 Chronic kidney disease, stage 3 unspecified: Secondary | ICD-10-CM

## 2018-04-02 DIAGNOSIS — M79659 Pain in unspecified thigh: Secondary | ICD-10-CM

## 2018-04-02 DIAGNOSIS — D472 Monoclonal gammopathy: Secondary | ICD-10-CM | POA: Diagnosis not present

## 2018-04-02 DIAGNOSIS — M79652 Pain in left thigh: Secondary | ICD-10-CM | POA: Diagnosis not present

## 2018-04-02 DIAGNOSIS — I129 Hypertensive chronic kidney disease with stage 1 through stage 4 chronic kidney disease, or unspecified chronic kidney disease: Secondary | ICD-10-CM | POA: Diagnosis not present

## 2018-04-02 NOTE — Progress Notes (Signed)
Hematology/Oncology follow up note [facility] Telephone:(336) [phone] Fax:(336) [phone]   Patient Care Team: Kathrine Haddock, NP as PCP - General (Nurse Practitioner)  REFERRING PROVIDER: [facility] Rheymatology REASON FOR VISIT Follow up for management of MGUS  HISTORY OF PRESENTING ILLNESS:  Cynthia Hurst is a  83 y.o.  female with PMH listed below who was referred to me for evaluation of hypercalcemia.  Patient was referred to see a see rheumatology for evaluation of positive ANA, joint pain.  Lab workup reviewed hypercalcemia of 11, normal PTH, normal 25-hydroxy vitamin D level, normal TSH, SPEP showed  monoclonal spike of 0.4.  Immunofixation showed elevated IgA monoclonal protein with kappa light chain specificity. Patient reports feeling well, she denies any fatigue, weight loss, back pain history of fracture.  She has bilateral knee replacement reports having posterior thigh pain bilaterally. Patient denies taking any calcium supplements.  Never smoker  .INTERVAL HISTORY Cynthia Hurst is a 83 y.o. female who has above history reviewed by me today presents for follow up visit for management of IgA MUGS. Multiple family members, including son, daughter and husband accompanied patient to today's visit.  She reports no bone pain, fatigue, weight loss  Review of Systems  Constitutional: Negative for chills, fever, malaise/fatigue and weight loss.  HENT: Negative for ear discharge and nosebleeds.   Eyes: Negative for double vision, photophobia and pain.  Respiratory: Negative for cough and hemoptysis.   Cardiovascular: Negative for chest pain and palpitations.  Gastrointestinal: Negative for constipation, heartburn, nausea and vomiting.  Genitourinary: Negative for dysuria and urgency.  Musculoskeletal: Positive for joint pain.  Skin: Negative for rash.  Neurological: Negative for dizziness, tingling, sensory change, weakness and headaches.    Endo/Heme/Allergies: Negative for environmental allergies. Does not bruise/bleed easily.  Psychiatric/Behavioral: Negative for depression, hallucinations and substance abuse.    MEDICAL HISTORY:  Past Medical History:  Diagnosis Date  . Chronic kidney disease   . GERD (gastroesophageal reflux disease)   . Gout   . Hypertension   . Hypopotassemia   . Osteoarthritis   . Osteoporosis    LUMBAR SPINE  . Vitamin B 12 deficiency   . Vitamin D deficiency     SURGICAL HISTORY: Past Surgical History:  Procedure Laterality Date  . ABDOMINAL HYSTERECTOMY     PARTIAL  . JOINT REPLACEMENT  2010,2011   KNEE    SOCIAL HISTORY: Social History   Socioeconomic History  . Marital status: Married    Spouse name: Not on file  . Number of children: Not on file  . Years of education: Not on file  . Highest education level: Not on file  Occupational History  . Not on file  Social Needs  . Financial resource strain: Not on file  . Food insecurity:    Worry: Not on file    Inability: Not on file  . Transportation needs:    Medical: Not on file    Non-medical: Not on file  Tobacco Use  . Smoking status: Never Smoker  . Smokeless tobacco: Never Used  Substance and Sexual Activity  . Alcohol use: No  . Drug use: No  . Sexual activity: Yes  Lifestyle  . Physical activity:    Days per week: Not on file    Minutes per session: Not on file  . Stress: Not on file  Relationships  . Social connections:    Talks on phone: Not on file    Gets together: Not on file  Attends religious service: Not on file    Active member of club or organization: Not on file    Attends meetings of clubs or organizations: Not on file    Relationship status: Not on file  . Intimate partner violence:    Fear of current or ex partner: Not on file    Emotionally abused: Not on file    Physically abused: Not on file    Forced sexual activity: Not on file  Other Topics Concern  . Not on file  Social  History Narrative  . Not on file    FAMILY HISTORY: Family History  Problem Relation Age of Onset  . Heart disease Mother   . Cancer Father   . Mental illness Father   . Breast cancer Neg Hx     ALLERGIES:  has No Known Allergies.  MEDICATIONS:  Current Outpatient Medications  Medication Sig Dispense Refill  . allopurinol (ZYLOPRIM) 300 MG tablet Take 1 tablet (300 mg total) by mouth daily. 90 tablet 3  . atenolol (TENORMIN) 100 MG tablet Take 1 tablet (100 mg total) by mouth daily. 90 tablet 3  . cholecalciferol (VITAMIN D) 1000 UNITS tablet Take 1,000 Units by mouth daily.    . cyclobenzaprine (FLEXERIL) 10 MG tablet Take 1 tablet (10 mg total) by mouth at bedtime. (Patient not taking: Reported on 03/21/2018) 30 tablet 1  . meloxicam (MOBIC) 15 MG tablet Take 1 tablet (15 mg total) by mouth daily. (Patient not taking: Reported on 03/21/2018) 30 tablet 0  . NIFEdipine (PROCARDIA-XL/ADALAT CC) 60 MG 24 hr tablet TAKE ONE TABLET BY MOUTH DAILY 90 tablet 0  . omeprazole (PRILOSEC) 20 MG capsule TAKE ONE CAPSULE BY MOUTH DAILY 90 capsule 0  . potassium chloride SA (K-DUR,KLOR-CON) 20 MEQ tablet TAKE ONE TABLET BY MOUTH DAILY 90 tablet 2  . predniSONE (DELTASONE) 10 MG tablet Take 6 tabs day one, 5 tabs day two, 4 tabs day three, etc. 21 tablet 0  . raloxifene (EVISTA) 60 MG tablet TAKE ONE TABLET BY MOUTH DAILY 90 tablet 2  . traMADol (ULTRAM) 50 MG tablet Take 1 tablet (50 mg total) by mouth every 8 (eight) hours as needed. (Patient not taking: Reported on 03/21/2018) 6 tablet 0  . vitamin B-12 (CYANOCOBALAMIN) 1000 MCG tablet Take 1,000 mcg by mouth daily.     No current facility-administered medications for this visit.      PHYSICAL EXAMINATION: ECOG PERFORMANCE STATUS: 0 - Asymptomatic Vitals:   04/02/18 1113  BP: (!) 97/50  Pulse: 65  Resp: 18  Temp: (!) 96.6 F (35.9 C)   Filed Weights   04/02/18 1113  Weight: 177 lb 12.8 oz (80.6 kg)    Physical Exam    Constitutional: She is oriented to person, place, and time. She appears well-developed and well-nourished.  HENT:  Head: Normocephalic and atraumatic.  Eyes: Pupils are equal, round, and reactive to light. EOM are normal.  Neck: Normal range of motion. Neck supple.  Cardiovascular: Normal rate, regular rhythm and normal heart sounds.  Pulmonary/Chest: Effort normal and breath sounds normal. No respiratory distress.  Abdominal: Soft. Bowel sounds are normal. There is no tenderness.  Musculoskeletal: Normal range of motion. She exhibits no edema.  Neurological: She is alert and oriented to person, place, and time. No cranial nerve deficit.  Skin: Skin is warm and dry.  Psychiatric: She has a normal mood and affect. Her behavior is normal.     LABORATORY DATA:  I have reviewed the data  as listed Lab Results  Component Value Date   WBC 10.5 03/29/2018   HGB 12.6 03/29/2018   HCT 37.5 03/29/2018   MCV 93.9 03/29/2018   PLT 181 03/29/2018   Recent Labs    07/12/17 1345 01/15/18 1121 03/21/18 1255  NA 143 141 136  K 4.2 4.1 4.0  CL 106 103 104  CO2 '25 23 25  ' GLUCOSE 84 116* 118*  BUN '13 14 20  ' CREATININE 0.95 0.76 1.04*  CALCIUM 10.0 10.4* 9.9  GFRNONAA 57* 74 49*  GFRAA 65 86 57*  PROT 6.8 7.0 7.4  ALBUMIN 4.1 4.1 3.9  AST '17 18 19  ' ALT 8 11 11*  ALKPHOS 107 111 85  BILITOT 0.6 0.4 0.9    Multiple myeloma work up  [facility] clinic: SPEP showed  monoclonal spike of 0.4.  Immunofixation showed elevated IgA monoclonal protein with kappa light chain specificity.  Urine light chain ratio elevated at 12.54, no monoclonal spike on UPEP.    ASSESSMENT & PLAN:  1. MGUS (monoclonal gammopathy of unknown significance)   2. CKD (chronic kidney disease), stage III (HCC)    IgA MGUS diagnosis is discussed and explained to patient and her family members. Explained to them that patient likely has pre-multiple myeloma state. Usually MGUS has 1 percent a year to evolve to multiple  myeloma and IgA multiple myeloma has slightly higher chance than IgG MGUS and needs to be closely monitored.  According to [facility] risk stratification model, she does not meet criteria for low risk patient which is M spike <1.5, IgG and normal FLC ration. Therefore I recommend a baseline bone marrow biopsy.  Rational and side effects of bone marrow biopsy were discussed win detail.  Patient and family members agree with bone marrow biopsy.   All questions were answered. The patient knows to call the clinic with any problems questions or concerns.  Return of visit: 10 days. After bone marrow biopsy.  Dr.Bock , thank you for this kind referral and the opportunity to participate in the care of this patient. A copy of today's note is routed to referring provider  Total face to face encounter time for this patient visit was 45  min. >50% of the time was  spent in counseling and coordination of care.   Earlie Server, MD, PhD Hematology Oncology [facility] at [facility] Pager- [phone] 04/02/2018

## 2018-04-02 NOTE — Telephone Encounter (Signed)
Per Julie/verbal, will send schd msg for f/u to be schd after Biopsy has been scheduled.

## 2018-04-02 NOTE — Progress Notes (Signed)
Here for follow up

## 2018-04-03 LAB — UPEP/UIFE/LIGHT CHAINS/TP, 24-HR UR
% BETA, URINE: 0 %
ALBUMIN, U: 100 %
ALPHA 1 URINE: 0 %
ALPHA 2 UR: 0 %
Free Kappa Lt Chains,Ur: 17.8 mg/L (ref 1.35–24.19)
Free Kappa/Lambda Ratio: 12.54 — ABNORMAL HIGH (ref 2.04–10.37)
Free Lambda Lt Chains,Ur: 1.42 mg/L (ref 0.24–6.66)
GAMMA GLOBULIN URINE: 0 %

## 2018-04-06 ENCOUNTER — Ambulatory Visit: Payer: Self-pay | Admitting: *Deleted

## 2018-04-06 NOTE — Telephone Encounter (Signed)
Question regarding which of her medications is for blood pressure. Information given-Atenolol 100 MG tablet.

## 2018-04-10 ENCOUNTER — Other Ambulatory Visit: Payer: Self-pay | Admitting: Student

## 2018-04-11 ENCOUNTER — Ambulatory Visit
Admission: RE | Admit: 2018-04-11 | Discharge: 2018-04-11 | Disposition: A | Discharge status: Home / Self Care | Payer: Medicare Other | Source: Ambulatory Visit | Attending: Oncology | Admitting: Oncology

## 2018-04-11 ENCOUNTER — Other Ambulatory Visit (HOSPITAL_COMMUNITY)
Admission: RE | Admit: 2018-04-11 | Disposition: A | Discharge status: Home / Self Care | Payer: Medicare Other | Source: Ambulatory Visit | Attending: Oncology | Admitting: Oncology

## 2018-04-11 DIAGNOSIS — D472 Monoclonal gammopathy: Secondary | ICD-10-CM | POA: Insufficient documentation

## 2018-04-11 DIAGNOSIS — D696 Thrombocytopenia, unspecified: Secondary | ICD-10-CM | POA: Diagnosis not present

## 2018-04-11 DIAGNOSIS — N183 Chronic kidney disease, stage 3 unspecified: Secondary | ICD-10-CM

## 2018-04-11 DIAGNOSIS — D7589 Other specified diseases of blood and blood-forming organs: Secondary | ICD-10-CM | POA: Diagnosis not present

## 2018-04-11 LAB — CBC WITH DIFFERENTIAL/PLATELET
Basophils Absolute: 0 10*3/uL (ref 0–0.1)
Basophils Relative: 0 %
EOS ABS: 0.1 10*3/uL (ref 0–0.7)
Eosinophils Relative: 1 %
HEMATOCRIT: 37.5 % (ref 35.0–47.0)
HEMOGLOBIN: 12.4 g/dL (ref 12.0–16.0)
LYMPHS ABS: 2.6 10*3/uL (ref 1.0–3.6)
Lymphocytes Relative: 34 %
MCH: 31.6 pg (ref 26.0–34.0)
MCHC: 33.2 g/dL (ref 32.0–36.0)
MCV: 95.3 fL (ref 80.0–100.0)
MONOS PCT: 7 %
Monocytes Absolute: 0.5 10*3/uL (ref 0.2–0.9)
NEUTROS ABS: 4.5 10*3/uL (ref 1.4–6.5)
NEUTROS PCT: 58 %
Platelets: 148 10*3/uL — ABNORMAL LOW (ref 150–440)
RBC: 3.93 MIL/uL (ref 3.80–5.20)
RDW: 16.3 % — ABNORMAL HIGH (ref 11.5–14.5)
WBC: 7.6 10*3/uL (ref 3.6–11.0)

## 2018-04-11 LAB — APTT: aPTT: 30 seconds (ref 24–36)

## 2018-04-11 LAB — PROTIME-INR
INR: 0.93
Prothrombin Time: 12.4 seconds (ref 11.4–15.2)

## 2018-04-11 MED ORDER — SODIUM CHLORIDE 0.9 % IV SOLN
INTRAVENOUS | Status: DC
  Administered 2018-04-11: 09:00:00 via INTRAVENOUS

## 2018-04-11 MED ORDER — HYDROCODONE-ACETAMINOPHEN 5-325 MG PO TABS: 1.0000 | ORAL_TABLET | ORAL | Status: DC | PRN

## 2018-04-11 MED ORDER — MIDAZOLAM HCL 5 MG/5ML IJ SOLN
INTRAMUSCULAR | Status: AC
  Filled 2018-04-11: qty 5

## 2018-04-11 MED ORDER — MIDAZOLAM HCL 5 MG/5ML IJ SOLN
INTRAMUSCULAR | Status: AC | PRN
  Administered 2018-04-11 (×2): 1 mg via INTRAVENOUS

## 2018-04-11 MED ORDER — FENTANYL CITRATE (PF) 100 MCG/2ML IJ SOLN
INTRAMUSCULAR | Status: AC
  Filled 2018-04-11: qty 4

## 2018-04-11 MED ORDER — LIDOCAINE HCL (PF) 1 % IJ SOLN
INTRAMUSCULAR | Status: AC | PRN
  Administered 2018-04-11: 10 mL

## 2018-04-11 MED ORDER — HEPARIN SOD (PORK) LOCK FLUSH 100 UNIT/ML IV SOLN
INTRAVENOUS | Status: AC
  Filled 2018-04-11: qty 5

## 2018-04-11 MED ORDER — FENTANYL CITRATE (PF) 100 MCG/2ML IJ SOLN
INTRAMUSCULAR | Status: AC | PRN
  Administered 2018-04-11: 25 ug via INTRAVENOUS
  Administered 2018-04-11: 50 ug via INTRAVENOUS

## 2018-04-11 NOTE — Procedures (Signed)
  Procedure: CT bone marrow biopsy R iliac EBL:   minimal Complications:  none immediate  See full dictation in [facility].  Dillard Cannon MD Main # [phone] Pager  [phone]

## 2018-04-20 ENCOUNTER — Telehealth: Payer: Self-pay | Admitting: Unknown Physician Specialty

## 2018-04-20 NOTE — Telephone Encounter (Signed)
Sending to supervising provider due to Cheryl's absence and this involving home health.

## 2018-04-20 NOTE — Telephone Encounter (Signed)
Copied from [place] [id number]. Topic: Quick Communication - See Telephone Encounter >> Apr 20, 2018 11:56 AM Rutherford Nail, NT wrote: CRM for notification. See Telephone encounter for: 04/20/18. Inez Catalina with [facility], palliative program is calling to inform Cynthia Hurst that [facility] have a program that they identify for high risk patients. They send nurses and social workers out to the patient to talk about goals of care and do assessments. Would Dr Julian Hy be okay with this? Please advise. CB#:[phone]

## 2018-04-25 DIAGNOSIS — M353 Polymyalgia rheumatica: Secondary | ICD-10-CM | POA: Diagnosis not present

## 2018-04-26 NOTE — Progress Notes (Signed)
Hematology/Oncology follow up note [name] Telephone:(336) [phone] Fax:(336) [phone]   Patient Care Team: Kathrine Haddock, NP as PCP - General (Nurse Practitioner)  REFERRING PROVIDER: [facility] Rheymatology REASON FOR VISIT Follow up for management of MGUS  HISTORY OF PRESENTING ILLNESS:  Cynthia Hurst is a  83 y.o.  female with PMH listed below who was referred to me for evaluation of hypercalcemia.  Patient was referred to see a see rheumatology for evaluation of positive ANA, joint pain.  Lab workup reviewed hypercalcemia of 11, normal PTH, normal 25-hydroxy vitamin D level, normal TSH, SPEP showed  monoclonal spike of 0.4.  Immunofixation showed elevated IgA monoclonal protein with kappa light chain specificity. Patient reports feeling well, she denies any fatigue, weight loss, back pain history of fracture.  She has bilateral knee replacement reports having posterior thigh pain bilaterally. Patient denies taking any calcium supplements.  Never smoker  .INTERVAL HISTORY Cynthia Hurst is a 83 y.o. female who has above history reviewed by me today presents for follow up visit for management of IgA MUGS. Multiple family members, including son, daughter and husband accompanied patient to today's visit.  Patient reports no new complaints today.  During the interval she has had a bone marrow biopsy done.  Present to discuss about results. She reports no bone pain, fatigue, weight loss  Review of Systems  Constitutional: Negative for chills, fever, malaise/fatigue and weight loss.  HENT: Negative for congestion, ear discharge, ear pain, nosebleeds, sinus pain and sore throat.   Eyes: Negative for double vision, photophobia, pain, discharge and redness.  Respiratory: Negative for cough, hemoptysis, sputum production, shortness of breath and wheezing.   Cardiovascular: Negative for chest pain, palpitations, orthopnea, claudication and leg swelling.    Gastrointestinal: Negative for abdominal pain, blood in stool, constipation, diarrhea, heartburn, melena, nausea and vomiting.  Genitourinary: Negative for dysuria, flank pain, frequency, hematuria and urgency.  Musculoskeletal: Positive for joint pain. Negative for back pain, myalgias and neck pain.  Skin: Negative for itching and rash.  Neurological: Negative for dizziness, tingling, tremors, sensory change, focal weakness, weakness and headaches.  Endo/Heme/Allergies: Negative for environmental allergies. Does not bruise/bleed easily.  Psychiatric/Behavioral: Negative for depression, hallucinations and substance abuse. The patient is not nervous/anxious.     MEDICAL HISTORY:  Past Medical History:  Diagnosis Date  . Chronic kidney disease   . GERD (gastroesophageal reflux disease)   . Gout   . Hypertension   . Hypopotassemia   . Osteoarthritis   . Osteoporosis    LUMBAR SPINE  . Vitamin B 12 deficiency   . Vitamin D deficiency     SURGICAL HISTORY: Past Surgical History:  Procedure Laterality Date  . ABDOMINAL HYSTERECTOMY     PARTIAL  . JOINT REPLACEMENT  2010,2011   KNEE    SOCIAL HISTORY: Social History   Socioeconomic History  . Marital status: Married    Spouse name: Not on file  . Number of children: Not on file  . Years of education: Not on file  . Highest education level: Not on file  Occupational History  . Not on file  Social Needs  . Financial resource strain: Not on file  . Food insecurity:    Worry: Not on file    Inability: Not on file  . Transportation needs:    Medical: Not on file    Non-medical: Not on file  Tobacco Use  . Smoking status: Never Smoker  . Smokeless tobacco: Never Used  Substance and  Sexual Activity  . Alcohol use: No  . Drug use: No  . Sexual activity: Yes  Lifestyle  . Physical activity:    Days per week: Not on file    Minutes per session: Not on file  . Stress: Not on file  Relationships  . Social connections:     Talks on phone: Not on file    Gets together: Not on file    Attends religious service: Not on file    Active member of club or organization: Not on file    Attends meetings of clubs or organizations: Not on file    Relationship status: Not on file  . Intimate partner violence:    Fear of current or ex partner: Not on file    Emotionally abused: Not on file    Physically abused: Not on file    Forced sexual activity: Not on file  Other Topics Concern  . Not on file  Social History Narrative  . Not on file    FAMILY HISTORY: Family History  Problem Relation Age of Onset  . Heart disease Mother   . Cancer Father   . Mental illness Father   . Breast cancer Neg Hx     ALLERGIES:  has No Known Allergies.  MEDICATIONS:  Current Outpatient Medications  Medication Sig Dispense Refill  . allopurinol (ZYLOPRIM) 300 MG tablet Take 1 tablet (300 mg total) by mouth daily. 90 tablet 3  . atenolol (TENORMIN) 100 MG tablet Take 1 tablet (100 mg total) by mouth daily. 90 tablet 3  . cholecalciferol (VITAMIN D) 1000 UNITS tablet Take 1,000 Units by mouth daily.    Marland Kitchen NIFEdipine (PROCARDIA-XL/ADALAT CC) 60 MG 24 hr tablet TAKE ONE TABLET BY MOUTH DAILY 90 tablet 0  . omeprazole (PRILOSEC) 20 MG capsule TAKE ONE CAPSULE BY MOUTH DAILY 90 capsule 0  . potassium chloride SA (K-DUR,KLOR-CON) 20 MEQ tablet TAKE ONE TABLET BY MOUTH DAILY 90 tablet 2  . raloxifene (EVISTA) 60 MG tablet TAKE ONE TABLET BY MOUTH DAILY 90 tablet 2  . vitamin B-12 (CYANOCOBALAMIN) 1000 MCG tablet Take 1,000 mcg by mouth daily.    . cyclobenzaprine (FLEXERIL) 10 MG tablet Take 1 tablet (10 mg total) by mouth at bedtime. (Patient not taking: Reported on 03/21/2018) 30 tablet 1  . meloxicam (MOBIC) 15 MG tablet Take 1 tablet (15 mg total) by mouth daily. (Patient not taking: Reported on 03/21/2018) 30 tablet 0  . predniSONE (DELTASONE) 10 MG tablet Take 15 mg by mouth daily with breakfast.    . traMADol (ULTRAM) 50 MG  tablet Take 1 tablet (50 mg total) by mouth every 8 (eight) hours as needed. (Patient not taking: Reported on 03/21/2018) 6 tablet 0   No current facility-administered medications for this visit.      PHYSICAL EXAMINATION: ECOG PERFORMANCE STATUS: 0 - Asymptomatic Vitals:   04/27/18 1025  BP: 101/64  Pulse: (!) 56  Resp: 18  Temp: (!) 97.3 F (36.3 C)   Filed Weights   04/27/18 1025  Weight: 180 lb 12.8 oz (82 kg)    Physical Exam  Constitutional: She is oriented to person, place, and time. She appears well-developed and well-nourished. No distress.  HENT:  Head: Normocephalic and atraumatic.  Right Ear: External ear normal.  Left Ear: External ear normal.  Mouth/Throat: Oropharynx is clear and moist.  Eyes: Pupils are equal, round, and reactive to light. Conjunctivae and EOM are normal. No scleral icterus.  Neck: Normal range of motion. Neck  supple.  Cardiovascular: Normal rate, regular rhythm and normal heart sounds.  Pulmonary/Chest: Effort normal and breath sounds normal. No respiratory distress. She has no wheezes. She has no rales. She exhibits no tenderness.  Abdominal: Soft. Bowel sounds are normal. She exhibits no distension and no mass. There is no tenderness.  Musculoskeletal: Normal range of motion. She exhibits no edema or deformity.  Lymphadenopathy:    She has no cervical adenopathy.  Neurological: She is alert and oriented to person, place, and time. No cranial nerve deficit. Coordination normal.  Skin: Skin is warm and dry. No rash noted.  Psychiatric: She has a normal mood and affect. Her behavior is normal. Thought content normal.     LABORATORY DATA:  I have reviewed the data as listed Lab Results  Component Value Date   WBC 7.6 04/11/2018   HGB 12.4 04/11/2018   HCT 37.5 04/11/2018   MCV 95.3 04/11/2018   PLT 148 (L) 04/11/2018   Recent Labs    07/12/17 1345 01/15/18 1121 03/21/18 1255  NA 143 141 136  K 4.2 4.1 4.0  CL 106 103 104  CO2  '25 23 25  ' GLUCOSE 84 116* 118*  BUN '13 14 20  ' CREATININE 0.95 0.76 1.04*  CALCIUM 10.0 10.4* 9.9  GFRNONAA 57* 74 49*  GFRAA 65 86 57*  PROT 6.8 7.0 7.4  ALBUMIN 4.1 4.1 3.9  AST '17 18 19  ' ALT 8 11 11*  ALKPHOS 107 111 85  BILITOT 0.6 0.4 0.9    Multiple myeloma work up  [facility] clinic: SPEP showed  monoclonal spike of 0.4.  Immunofixation showed elevated IgA monoclonal protein with kappa light chain specificity.  Urine light chain ratio elevated at 12.54, no monoclonal spike on UPEP.    ASSESSMENT & PLAN:  1. MGUS (monoclonal gammopathy of unknown significance)   2. CKD (chronic kidney disease), stage III ([place])   3. Hypercalcemia    #Bone marrow biopsy results was reviewed with patient and her family members in details.  Showed 2% plasma cells, consistent with MGUS Patient has IgA MGUS, recommend follow-up and monitoring serum protein electrophoresis every 6 months. No treatment is needed for now.  Discussed. # hypercalcemia: resolved All questions were answered. The patient knows to call the clinic with any problems questions or concerns.  Return of visit: 6 months, repeat CBC, SPEP 10 days prior to visits..  Dr.Bock , thank you for this kind referral and the opportunity to participate in the care of this patient. A copy of today's note is routed to referring provider   Earlie Server, MD, PhD Hematology Oncology [facility] at [facility] Pager- [phone] 04/26/2018

## 2018-04-27 ENCOUNTER — Inpatient Hospital Stay: Payer: Medicare Other | Attending: Oncology | Admitting: Oncology

## 2018-04-27 ENCOUNTER — Encounter: Payer: Self-pay | Admitting: Oncology

## 2018-04-27 VITALS — BP 101/64 | HR 56 | Temp 97.3°F | Resp 18 | Ht 65.0 in | Wt 180.8 lb

## 2018-04-27 DIAGNOSIS — N183 Chronic kidney disease, stage 3 unspecified: Secondary | ICD-10-CM

## 2018-04-27 DIAGNOSIS — I129 Hypertensive chronic kidney disease with stage 1 through stage 4 chronic kidney disease, or unspecified chronic kidney disease: Secondary | ICD-10-CM | POA: Insufficient documentation

## 2018-04-27 DIAGNOSIS — D472 Monoclonal gammopathy: Secondary | ICD-10-CM | POA: Diagnosis present

## 2018-04-27 NOTE — Progress Notes (Signed)
Pt here to get he bx results and family here for her.

## 2018-05-01 DIAGNOSIS — H25813 Combined forms of age-related cataract, bilateral: Secondary | ICD-10-CM | POA: Diagnosis not present

## 2018-05-08 ENCOUNTER — Encounter (HOSPITAL_COMMUNITY): Payer: Self-pay | Admitting: Oncology

## 2018-05-08 LAB — CHROMOSOME ANALYSIS, BONE MARROW

## 2018-05-12 HISTORY — PX: EYE SURGERY: SHX253

## 2018-05-22 ENCOUNTER — Telehealth: Payer: Self-pay | Admitting: Unknown Physician Specialty

## 2018-05-22 MED ORDER — NIFEDIPINE ER 60 MG PO TB24: 60.0000 mg | ORAL_TABLET | Freq: Every day | ORAL | 0 refills | Status: DC

## 2018-05-22 NOTE — Telephone Encounter (Signed)
Copied from [place] [id number]. Topic: Quick Communication - Rx Refill/Question >> May 22, 2018  9:14 AM Carolyn Stare wrote: Medication   NIFEdipine (PROCARDIA-XL/ADALAT CC) 60 MG 24 hr tablet  Has the patient contacted their pharmacy yes    Preferred Pharm[facility]   Agent: Please be advised that RX refills may take up to 3 business days. We ask that you follow-up with your pharmacy.

## 2018-05-29 DIAGNOSIS — D472 Monoclonal gammopathy: Secondary | ICD-10-CM | POA: Diagnosis not present

## 2018-05-29 DIAGNOSIS — R768 Other specified abnormal immunological findings in serum: Secondary | ICD-10-CM | POA: Diagnosis not present

## 2018-05-29 DIAGNOSIS — M353 Polymyalgia rheumatica: Secondary | ICD-10-CM | POA: Diagnosis not present

## 2018-05-29 DIAGNOSIS — R5383 Other fatigue: Secondary | ICD-10-CM | POA: Diagnosis not present

## 2018-05-29 DIAGNOSIS — D696 Thrombocytopenia, unspecified: Secondary | ICD-10-CM | POA: Diagnosis not present

## 2018-05-29 DIAGNOSIS — R252 Cramp and spasm: Secondary | ICD-10-CM | POA: Diagnosis not present

## 2018-06-07 DIAGNOSIS — H353132 Nonexudative age-related macular degeneration, bilateral, intermediate dry stage: Secondary | ICD-10-CM | POA: Diagnosis not present

## 2018-06-07 DIAGNOSIS — H25812 Combined forms of age-related cataract, left eye: Secondary | ICD-10-CM | POA: Diagnosis not present

## 2018-06-07 DIAGNOSIS — H2512 Age-related nuclear cataract, left eye: Secondary | ICD-10-CM | POA: Diagnosis not present

## 2018-06-25 ENCOUNTER — Other Ambulatory Visit: Payer: Self-pay | Admitting: Unknown Physician Specialty

## 2018-06-25 NOTE — Telephone Encounter (Signed)
Copied from [place] [id number]. Topic: Quick Communication - See Telephone Encounter >> Jun 25, 2018  3:42 PM Conception Chancy, NT wrote: CRM for notification. See Telephone encounter for: 06/25/18.  Patient is calling and requesting a refill on omeprazole (PRILOSEC) 20 MG capsule. Please advise.   [facility] - [place], [place] - [address] [place] [address] Phone: [phone] Fax: [phone]

## 2018-06-26 MED ORDER — OMEPRAZOLE 20 MG PO CPDR: 20.0000 mg | DELAYED_RELEASE_CAPSULE | Freq: Every day | ORAL | 3 refills | Status: DC

## 2018-06-26 NOTE — Telephone Encounter (Signed)
Rx refill request: omeprazole 20 mg   Last filled: 03/30/18  LOV: 12/16/16 (with diag)            02/09/18 acute  PCP: [place]: [facility]

## 2018-06-27 ENCOUNTER — Other Ambulatory Visit: Payer: Self-pay | Admitting: Unknown Physician Specialty

## 2018-06-27 ENCOUNTER — Ambulatory Visit (INDEPENDENT_AMBULATORY_CARE_PROVIDER_SITE_OTHER): Payer: Medicare Other

## 2018-06-27 VITALS — BP 162/78 | HR 65 | Temp 98.2°F | Resp 16 | Ht 62.0 in | Wt 180.0 lb

## 2018-06-27 DIAGNOSIS — Z0000 Encounter for general adult medical examination without abnormal findings: Secondary | ICD-10-CM

## 2018-06-27 NOTE — Patient Instructions (Addendum)
Cynthia Hurst , Thank you for taking time to come for yourMedicare Wellness Visit. I appreciate your ongoing commitment to your health goals. Please review the following plan we discussed and let me know if I can assist you in the future.   Screening recommendations/referrals: Colonoscopy: completed 07/02/2014, no longer required  Mammogram: completed 11/11/2016, no longer required  Bone Density: completed 05/25/2010  Recommended yearly ophthalmology/optometry visit for glaucoma screening and checkup Recommended yearly dental visit for hygiene and checkup  Vaccinations: Influenza vaccine: due 08/2018 Pneumococcal vaccine: declined Tdap vaccine: due, check with your insurance company for coverage Shingles vaccine: due, check with your insurance company for coverage  Advanced directives: Advance directive discussed with you today. Even though you declined this today please call our office should you change your mind and we can give you the proper paperwork for you to fill out.  Conditions/risks identified: Recommend drinking at least 6-8 glasses of water a day   Next appointment: Follow up in one year for your annual wellness exam.   Preventive Care 65 Years and Older, Female Preventive care refers to lifestyle choices and visits with your health care provider that can promote health and wellness. What does preventive care include?  A yearly physical exam. This is also called an annual well check.  Dental exams once or twice a year.  Routine eye exams. Ask your health care provider how often you should have your eyes checked.  Personal lifestyle choices, including:  Daily care of your teeth and gums.  Regular physical activity.  Eating a healthy diet.  Avoiding tobacco and drug use.  Limiting alcohol use.  Practicing safe sex.  Taking low doses of aspirin every day.  Taking vitamin and mineral supplements as recommended by your health care provider. What happens during an  annual well check? The services and screenings done by your health care provider during your annual well check will depend on your age, overall health, lifestyle risk factors, and family history of disease. Counseling  Your health care provider may ask you questions about your:  Alcohol use.  Tobacco use.  Drug use.  Emotional well-being.  Home and relationship well-being.  Sexual activity.  Eating habits.  History of falls.  Memory and ability to understand (cognition).  Work and work Statistician. Screening  You may have the following tests or measurements:  Height, weight, and BMI.  Blood pressure.  Lipid and cholesterol levels. These may be checked every 5 years, or more frequently if you are over 37 years old.  Skin check.  Lung cancer screening. You may have this screening every year starting at age 73 if you have a 30-pack-year history of smoking and currently smoke or have quit within the past 15 years.  Fecal occult blood test (FOBT) of the stool. You may have this test every year starting at age 75.  Flexible sigmoidoscopy or colonoscopy. You may have a sigmoidoscopy every 5 years or a colonoscopy every 10 years starting at age 56.  Prostate cancer screening. Recommendations will vary depending on your family history and other risks.  Hepatitis C blood test.  Hepatitis B blood test.  Sexually transmitted disease (STD) testing.  Diabetes screening. This is done by checking your blood sugar (glucose) after you have not eaten for a while (fasting). You may have this done every 1-3 years.  Abdominal aortic aneurysm (AAA) screening. You may need this if you are a current or former smoker.  Osteoporosis. You may be screened starting at age 24  if you are at high risk. Talk with your health care provider about your test results, treatment options, and if necessary, the need for more tests. Vaccines  Your health care provider may recommend certain vaccines,  such as:  Influenza vaccine. This is recommended every year.  Tetanus, diphtheria, and acellular pertussis (Tdap, Td) vaccine. You may need a Td booster every 10 years.  Zoster vaccine. You may need this after age 8.  Pneumococcal 13-valent conjugate (PCV13) vaccine. One dose is recommended after age 72.  Pneumococcal polysaccharide (PPSV23) vaccine. One dose is recommended after age 73. Talk to your health care provider about which screenings and vaccines you need and how often you need them. This information is not intended to replace advice given to you by your health care provider. Make sure you discuss any questions you have with your health care provider. Document Released: 12/25/2015 Document Revised: 08/17/2016 Document Reviewed: 09/29/2015 Elsevier Interactive Patient Education  2017 [place] Prevention in the Home Falls can cause injuries. They can happen to people of all ages. There are many things you can do to make your home safe and to help prevent falls. What can I do on the outside of my home?  Regularly fix the edges of walkways and driveways and fix any cracks.  Remove anything that might make you trip as you walk through a door, such as a raised step or threshold.  Trim any bushes or trees on the path to your home.  Use bright outdoor lighting.  Clear any walking paths of anything that might make someone trip, such as rocks or tools.  Regularly check to see if handrails are loose or broken. Make sure that both sides of any steps have handrails.  Any raised decks and porches should have guardrails on the edges.  Have any leaves, snow, or ice cleared regularly.  Use sand or salt on walking paths during winter.  Clean up any spills in your garage right away. This includes oil or grease spills. What can I do in the bathroom?  Use night lights.  Install grab bars by the toilet and in the tub and shower. Do not use towel bars as grab bars.  Use  non-skid mats or decals in the tub or shower.  If you need to sit down in the shower, use a plastic, non-slip stool.  Keep the floor dry. Clean up any water that spills on the floor as soon as it happens.  Remove soap buildup in the tub or shower regularly.  Attach bath mats securely with double-sided non-slip rug tape.  Do not have throw rugs and other things on the floor that can make you trip. What can I do in the bedroom?  Use night lights.  Make sure that you have a light by your bed that is easy to reach.  Do not use any sheets or blankets that are too big for your bed. They should not hang down onto the floor.  Have a firm chair that has side arms. You can use this for support while you get dressed.  Do not have throw rugs and other things on the floor that can make you trip. What can I do in the kitchen?  Clean up any spills right away.  Avoid walking on wet floors.  Keep items that you use a lot in easy-to-reach places.  If you need to reach something above you, use a strong step stool that has a grab bar.  Keep electrical  cords out of the way.  Do not use floor polish or wax that makes floors slippery. If you must use wax, use non-skid floor wax.  Do not have throw rugs and other things on the floor that can make you trip. What can I do with my stairs?  Do not leave any items on the stairs.  Make sure that there are handrails on both sides of the stairs and use them. Fix handrails that are broken or loose. Make sure that handrails are as long as the stairways.  Check any carpeting to make sure that it is firmly attached to the stairs. Fix any carpet that is loose or worn.  Avoid having throw rugs at the top or bottom of the stairs. If you do have throw rugs, attach them to the floor with carpet tape.  Make sure that you have a light switch at the top of the stairs and the bottom of the stairs. If you do not have them, ask someone to add them for you. What  else can I do to help prevent falls?  Wear shoes that:  Do not have high heels.  Have rubber bottoms.  Are comfortable and fit you well.  Are closed at the toe. Do not wear sandals.  If you use a stepladder:  Make sure that it is fully opened. Do not climb a closed stepladder.  Make sure that both sides of the stepladder are locked into place.  Ask someone to hold it for you, if possible.  Clearly mark and make sure that you can see:  Any grab bars or handrails.  First and last steps.  Where the edge of each step is.  Use tools that help you move around (mobility aids) if they are needed. These include:  Canes.  Walkers.  Scooters.  Crutches.  Turn on the lights when you go into a dark area. Replace any light bulbs as soon as they burn out.  Set up your furniture so you have a clear path. Avoid moving your furniture around.  If any of your floors are uneven, fix them.  If there are any pets around you, be aware of where they are.  Review your medicines with your doctor. Some medicines can make you feel dizzy. This can increase your chance of falling. Ask your doctor what other things that you can do to help prevent falls. This information is not intended to replace advice given to you by your health care provider. Make sure you discuss any questions you have with your health care provider. Document Released: 09/24/2009 Document Revised: 05/05/2016 Document Reviewed: 01/02/2015 Elsevier Interactive Patient Education  2017 [employer].

## 2018-06-27 NOTE — Progress Notes (Signed)
Subjective:   Cynthia Hurst is a 83 y.o. female who presents for Medicare Annual (Subsequent) preventive examination.  Review of Systems:   Cardiac Risk Factors include: advanced age (>21men, >22 women);dyslipidemia;hypertension;obesity (BMI >30kg/m2)     Objective:     Vitals: BP (!) 162/78 (BP Location: Left Arm, Patient Position: Sitting)   Pulse 65   Temp 98.2 F (36.8 C) (Temporal)   Resp 16   Ht 5\' 2"  (1.575 m)   Wt 180 lb (81.6 kg)   LMP  (LMP Unknown)   BMI 32.92 kg/m   Body mass index is 32.92 kg/m.  Advanced Directives 06/27/2018 04/27/2018 04/11/2018 04/02/2018 04/02/2018 03/21/2018 06/21/2017  Does Patient Have a Medical Advance Directive? No No No No No No No  Would patient like information on creating a medical advance directive? No - Patient declined No - Patient declined No - Patient declined Yes (MAU/Ambulatory/Procedural Areas - Information given) No - Patient declined - Yes (MAU/Ambulatory/Procedural Areas - Information given)    Tobacco Social History   Tobacco Use  Smoking Status Never Smoker  Smokeless Tobacco Never Used     Counseling given: Not Answered   Clinical Intake:  Pre-visit preparation completed: Yes  Pain : No/denies pain     Nutritional Status: BMI > 30  Obese Diabetes: No  How often do you need to have someone help you when you read instructions, pamphlets, or other written materials from your doctor or pharmacy?: 1 - Never What is the last grade level you completed in school?: high school   Interpreter Needed?: No  Information entered by :: Tiffany Hill,LPN   Past Medical History:  Diagnosis Date  . Chronic kidney disease   . GERD (gastroesophageal reflux disease)   . Gout   . Hypertension   . Hypopotassemia   . Osteoarthritis   . Osteoporosis    LUMBAR SPINE  . Vitamin B 12 deficiency   . Vitamin D deficiency    Past Surgical History:  Procedure Laterality Date  . ABDOMINAL HYSTERECTOMY     PARTIAL  . EYE  SURGERY Left 05/2018   cataract surgery   . JOINT REPLACEMENT Bilateral 2010,2011   KNEE   Family History  Problem Relation Age of Onset  . Heart disease Mother   . Cancer Father   . Alzheimer's disease Father   . Breast cancer Neg Hx    Social History   Socioeconomic History  . Marital status: Married    Spouse name: Not on file  . Number of children: Not on file  . Years of education: Not on file  . Highest education level: High school graduate  Occupational History  . Not on file  Social Needs  . Financial resource strain: Not hard at all  . Food insecurity:    Worry: Never true    Inability: Never true  . Transportation needs:    Medical: No    Non-medical: No  Tobacco Use  . Smoking status: Never Smoker  . Smokeless tobacco: Never Used  Substance and Sexual Activity  . Alcohol use: No  . Drug use: No  . Sexual activity: Yes  Lifestyle  . Physical activity:    Days per week: 0 days    Minutes per session: 0 min  . Stress: Not at all  Relationships  . Social connections:    Talks on phone: More than three times a week    Gets together: More than three times a week    Attends  religious service: More than 4 times per year    Active member of club or organization: Yes    Attends meetings of clubs or organizations: More than 4 times per year    Relationship status: Married  Other Topics Concern  . Not on file  Social History Narrative  . Not on file    Outpatient Encounter Medications as of 06/27/2018  Medication Sig  . allopurinol (ZYLOPRIM) 300 MG tablet Take 1 tablet (300 mg total) by mouth daily.  Marland Kitchen atenolol (TENORMIN) 100 MG tablet Take 1 tablet (100 mg total) by mouth daily.  . cholecalciferol (VITAMIN D) 1000 UNITS tablet Take 1,000 Units by mouth daily.  Marland Kitchen NIFEdipine (PROCARDIA-XL/ADALAT CC) 60 MG 24 hr tablet Take 1 tablet (60 mg total) by mouth daily.  Marland Kitchen omeprazole (PRILOSEC) 20 MG capsule Take 1 capsule (20 mg total) by mouth daily.  . potassium  chloride SA (K-DUR,KLOR-CON) 20 MEQ tablet TAKE ONE TABLET BY MOUTH DAILY  . raloxifene (EVISTA) 60 MG tablet TAKE ONE TABLET BY MOUTH DAILY  . vitamin B-12 (CYANOCOBALAMIN) 1000 MCG tablet Take 1,000 mcg by mouth daily.  . cyclobenzaprine (FLEXERIL) 10 MG tablet Take 1 tablet (10 mg total) by mouth at bedtime. (Patient not taking: Reported on 03/21/2018)  . meloxicam (MOBIC) 15 MG tablet Take 1 tablet (15 mg total) by mouth daily. (Patient not taking: Reported on 03/21/2018)  . predniSONE (DELTASONE) 10 MG tablet Take 15 mg by mouth daily with breakfast.  . traMADol (ULTRAM) 50 MG tablet Take 1 tablet (50 mg total) by mouth every 8 (eight) hours as needed. (Patient not taking: Reported on 03/21/2018)   No facility-administered encounter medications on file as of 06/27/2018.     Activities of Daily Living In your present state of health, do you have any difficulty performing the following activities: 06/27/2018  Hearing? N  Vision? N  Difficulty concentrating or making decisions? N  Walking or climbing stairs? N  Dressing or bathing? N  Doing errands, shopping? N  Preparing Food and eating ? N  Using the Toilet? N  In the past six months, have you accidently leaked urine? N  Do you have problems with loss of bowel control? N  Managing your Medications? N  Managing your Finances? N  Housekeeping or managing your Housekeeping? N  Some recent data might be hidden    Patient Care Team: Kathrine Haddock, NP as PCP - General (Nurse Practitioner) Marlowe Sax, MD as Referring Physician (Internal Medicine) Earlie Server, MD as Consulting Physician (Oncology)    Assessment:   This is a routine wellness examination for [place].  Exercise Activities and Dietary recommendations Current Exercise Habits: The patient does not participate in regular exercise at present, Exercise limited by: None identified  Goals    . DIET - INCREASE WATER INTAKE     Recommend drinking at least 6-8 glasses  of water a day        Fall Risk Fall Risk  06/27/2018 06/21/2017 06/10/2016 06/10/2015  Falls in the past year? No Yes No No  Number falls in past yr: - 1 - -  Injury with Fall? - Yes - -   Is the patient's home free of loose throw rugs in walkways, pet beds, electrical cords, etc?   no      Grab bars in the bathroom? no      Handrails on the stairs?   no stairs       Adequate lighting?   yes  Timed Get  Up and Go performed: Completed in 8 seconds with no use of assistive devices, steady gait. No intervention needed at this time.   Depression Screen PHQ 2/9 Scores 06/27/2018 06/21/2017 06/10/2016 06/10/2015  PHQ - 2 Score 0 0 0 0     Cognitive Function     6CIT Screen 06/27/2018 06/21/2017  What Year? 0 points 0 points  What month? 0 points 0 points  What time? 0 points 0 points  Count back from 20 0 points 0 points  Months in reverse 0 points 0 points  Repeat phrase 2 points 0 points  Total Score 2 0    Immunization History  Administered Date(s) Administered  . Pneumococcal Polysaccharide-23 03/23/2005  . Td 03/23/2005    Qualifies for Shingles Vaccine? Yes, discussed shingrix vaccine   Screening Tests Health Maintenance  Topic Date Due  . INFLUENZA VACCINE  07/16/2018 (Originally 07/12/2018)  . TETANUS/TDAP  01/22/2019 (Originally 03/24/2015)  . PNA vac Low Risk Adult (2 of 2 - PCV13) 01/22/2019 (Originally 03/23/2006)  . DEXA SCAN  Completed   Declined pneumo vaccine and tdap.    Cancer Screenings: Lung: Low Dose CT Chest recommended if Age 51-80 years, 30 pack-year currently smoking OR have quit w/in 15years. Patient does not qualify. Breast:  Up to date on Mammogram? No longer required  Up to date of Bone Density/Dexa? Yes Colorectal: no longer required  Additional Screenings:  Hepatitis C Screening: not indicated      Plan:  I have personally reviewed and addressed the Medicare Annual Wellness questionnaire and have noted the following in the patient's chart:    A. Medical and social history B. Use of alcohol, tobacco or illicit drugs  C. Current medications and supplements D. Functional ability and status E.  Nutritional status F.  Physical activity G. Advance directives H. List of other physicians I.  Hospitalizations, surgeries, and ER visits in previous 12 months J.  [place] such as hearing and vision if needed, cognitive and depression L. Referrals and appointments   In addition, I have reviewed and discussed with patient certain preventive protocols, quality metrics, and best practice recommendations. A written personalized care plan for preventive services as well as general preventive health recommendations were provided to patient.   Signed,  Tyler Aas, LPN Nurse Health Advisor   Nurse Notes:none

## 2018-06-28 DIAGNOSIS — H25811 Combined forms of age-related cataract, right eye: Secondary | ICD-10-CM | POA: Diagnosis not present

## 2018-06-28 DIAGNOSIS — H2511 Age-related nuclear cataract, right eye: Secondary | ICD-10-CM | POA: Diagnosis not present

## 2018-08-02 ENCOUNTER — Encounter: Payer: Self-pay | Admitting: Oncology

## 2018-08-16 ENCOUNTER — Telehealth: Payer: Self-pay | Admitting: Unknown Physician Specialty

## 2018-08-16 MED ORDER — NIFEDIPINE ER 60 MG PO TB24: 60.0000 mg | ORAL_TABLET | Freq: Every day | ORAL | 0 refills | Status: DC

## 2018-08-16 NOTE — Telephone Encounter (Signed)
Copied from [place] [id number]. Topic: Quick Communication - Rx Refill/Question >> Aug 16, 2018  2:17 PM Sheran Luz wrote: Medication: NIFEdipine (PROCARDIA-XL/ADALAT CC) 60 MG 24 hr tablet [[id number]]   Pt is requesting a 90 day supply refill   Preferred Pharmacy (with phone number or street name): [facility], [place] [phone] (Phone) [phone] (Fax)

## 2018-08-29 DIAGNOSIS — R252 Cramp and spasm: Secondary | ICD-10-CM | POA: Diagnosis not present

## 2018-08-29 DIAGNOSIS — M353 Polymyalgia rheumatica: Secondary | ICD-10-CM | POA: Diagnosis not present

## 2018-08-29 DIAGNOSIS — D472 Monoclonal gammopathy: Secondary | ICD-10-CM | POA: Diagnosis not present

## 2018-08-31 ENCOUNTER — Other Ambulatory Visit: Payer: Self-pay | Admitting: Unknown Physician Specialty

## 2018-09-05 ENCOUNTER — Ambulatory Visit (INDEPENDENT_AMBULATORY_CARE_PROVIDER_SITE_OTHER): Payer: Medicare Other | Admitting: Physician Assistant

## 2018-09-05 ENCOUNTER — Encounter: Payer: Self-pay | Admitting: Physician Assistant

## 2018-09-05 VITALS — BP 134/75 | HR 57 | Temp 98.7°F | Ht 65.0 in | Wt 180.4 lb

## 2018-09-05 DIAGNOSIS — N183 Chronic kidney disease, stage 3 unspecified: Secondary | ICD-10-CM

## 2018-09-05 DIAGNOSIS — E559 Vitamin D deficiency, unspecified: Secondary | ICD-10-CM | POA: Diagnosis not present

## 2018-09-05 DIAGNOSIS — I129 Hypertensive chronic kidney disease with stage 1 through stage 4 chronic kidney disease, or unspecified chronic kidney disease: Secondary | ICD-10-CM

## 2018-09-05 DIAGNOSIS — E538 Deficiency of other specified B group vitamins: Secondary | ICD-10-CM | POA: Diagnosis not present

## 2018-09-05 DIAGNOSIS — M109 Gout, unspecified: Secondary | ICD-10-CM

## 2018-09-05 NOTE — Progress Notes (Signed)
Subjective:    Patient ID: Cynthia Hurst, female    DOB: 1935/06/16, 83 y.o.   MRN: [id number]  Cynthia Hurst is a 83 y.o. female presenting on 09/05/2018 for Hypertension   HPI   HTN: No chest pain or SOB, no headaches. Taking medications daily without issues. Atenolol 100 mg daily and nifedipine 60 mg daily. Also takes potassium chloride 20 mEq daily.  BP Readings from Last 3 Encounters:  09/05/18 134/75  06/27/18 (!) 162/78  04/27/18 101/64   Gout: allopurinol 300 mg daily, no gout flares recently.   Raloxifene: Patient is not sure why she is on this. Thinks she had a bone density scan but a very long time ago.  Vitamin B 12 defiency: Takes vitamin B12 otc for this.   Social History   Tobacco Use  . Smoking status: Never Smoker  . Smokeless tobacco: Never Used  Substance Use Topics  . Alcohol use: No  . Drug use: No    Review of Systems Per HPI unless specifically indicated above     Objective:    BP 134/75 (BP Location: Left Arm, Cuff Size: Normal)   Pulse (!) 57   Temp 98.7 F (37.1 C) (Oral)   Ht '5\' 5"'  (1.651 m)   Wt 180 lb 6.4 oz (81.8 kg)   LMP  (LMP Unknown)   SpO2 92%   BMI 30.02 kg/m   Wt Readings from Last 3 Encounters:  09/05/18 180 lb 6.4 oz (81.8 kg)  06/27/18 180 lb (81.6 kg)  04/27/18 180 lb 12.8 oz (82 kg)    Physical Exam  Constitutional: She is oriented to person, place, and time. She appears well-developed and well-nourished.  Cardiovascular: Normal rate and regular rhythm.  Pulmonary/Chest: Effort normal and breath sounds normal.  Neurological: She is alert and oriented to person, place, and time.  Skin: Skin is warm and dry.  Psychiatric: She has a normal mood and affect. Her behavior is normal.   Results for orders placed or performed during the hospital encounter of 04/11/18  APTT upon arrival  Result Value Ref Range   aPTT 30 24 - 36 seconds  Protime-INR upon arrival  Result Value Ref Range   Prothrombin Time 12.4 11.4  - 15.2 seconds   INR 0.93   CBC with Differential/Platelet  Result Value Ref Range   WBC 7.6 3.6 - 11.0 K/uL   RBC 3.93 3.80 - 5.20 MIL/uL   Hemoglobin 12.4 12.0 - 16.0 g/dL   HCT 37.5 35.0 - 47.0 %   MCV 95.3 80.0 - 100.0 fL   MCH 31.6 26.0 - 34.0 pg   MCHC 33.2 32.0 - 36.0 g/dL   RDW 16.3 (H) 11.5 - 14.5 %   Platelets 148 (L) 150 - 440 K/uL   Neutrophils Relative % 58 %   Neutro Abs 4.5 1.4 - 6.5 K/uL   Lymphocytes Relative 34 %   Lymphs Abs 2.6 1.0 - 3.6 K/uL   Monocytes Relative 7 %   Monocytes Absolute 0.5 0.2 - 0.9 K/uL   Eosinophils Relative 1 %   Eosinophils Absolute 0.1 0 - 0.7 K/uL   Basophils Relative 0 %   Basophils Absolute 0.0 0 - 0.1 K/uL      Assessment & Plan:  1. Benign hypertension with chronic kidney disease  Stable, continue current medications. - Comp Met (CMET) - Lipid Profile  2. Vitamin B12 deficiency  - CBC with Differential/Platelet  3. CKD (chronic kidney disease), stage III ([place])  -  Comp Met (CMET)  4. Gout, unspecified cause, unspecified chronicity, unspecified site  Continue allopurinol.  5. Vitamin D deficiency     Follow up plan: Return in about 6 months (around 03/06/2019) for HTN, gout.   Carles Collet, PA-C [facility] 09/05/2018, 3:50 PM

## 2018-09-06 LAB — LIPID PANEL
Chol/HDL Ratio: 2.5 ratio (ref 0.0–4.4)
Cholesterol, Total: 158 mg/dL (ref 100–199)
HDL: 64 mg/dL (ref >39)
LDL Calculated: 72 mg/dL (ref 0–99)
Triglycerides: 111 mg/dL (ref 0–149)
VLDL Cholesterol Cal: 22 mg/dL (ref 5–40)

## 2018-09-06 LAB — COMPREHENSIVE METABOLIC PANEL
ALT: 7 IU/L (ref 0–32)
AST: 14 IU/L (ref 0–40)
Albumin/Globulin Ratio: 1.8 (ref 1.2–2.2)
Albumin: 4 g/dL (ref 3.5–4.7)
Alkaline Phosphatase: 82 IU/L (ref 39–117)
BUN/Creatinine Ratio: 17 (ref 12–28)
BUN: 17 mg/dL (ref 8–27)
Bilirubin Total: 0.6 mg/dL (ref 0.0–1.2)
CO2: 25 mmol/L (ref 20–29)
Calcium: 9.8 mg/dL (ref 8.7–10.3)
Chloride: 105 mmol/L (ref 96–106)
Creatinine, Ser: 1.02 mg/dL — ABNORMAL HIGH (ref 0.57–1.00)
GFR calc Af Amer: 60 mL/min/{1.73_m2} (ref >59)
GFR calc non Af Amer: 52 mL/min/{1.73_m2} — ABNORMAL LOW (ref >59)
Globulin, Total: 2.2 g/dL (ref 1.5–4.5)
Glucose: 74 mg/dL (ref 65–99)
Potassium: 3.9 mmol/L (ref 3.5–5.2)
Sodium: 144 mmol/L (ref 134–144)
Total Protein: 6.2 g/dL (ref 6.0–8.5)

## 2018-09-26 ENCOUNTER — Other Ambulatory Visit: Payer: Self-pay | Admitting: Unknown Physician Specialty

## 2018-09-29 ENCOUNTER — Other Ambulatory Visit: Payer: Self-pay | Admitting: Unknown Physician Specialty

## 2018-10-09 DIAGNOSIS — M353 Polymyalgia rheumatica: Secondary | ICD-10-CM | POA: Diagnosis not present

## 2018-10-19 ENCOUNTER — Inpatient Hospital Stay: Payer: Medicare Other | Attending: Oncology

## 2018-10-19 DIAGNOSIS — N183 Chronic kidney disease, stage 3 unspecified: Secondary | ICD-10-CM

## 2018-10-19 DIAGNOSIS — D472 Monoclonal gammopathy: Secondary | ICD-10-CM | POA: Insufficient documentation

## 2018-10-19 LAB — CBC WITH DIFFERENTIAL/PLATELET
ABS IMMATURE GRANULOCYTES: 0.04 10*3/uL (ref 0.00–0.07)
BASOS ABS: 0 10*3/uL (ref 0.0–0.1)
Basophils Relative: 0 %
EOS ABS: 0 10*3/uL (ref 0.0–0.5)
Eosinophils Relative: 1 %
HEMATOCRIT: 38 % (ref 36.0–46.0)
Hemoglobin: 12.5 g/dL (ref 12.0–15.0)
IMMATURE GRANULOCYTES: 1 %
LYMPHS ABS: 2 10*3/uL (ref 0.7–4.0)
Lymphocytes Relative: 24 %
MCH: 30.7 pg (ref 26.0–34.0)
MCHC: 32.9 g/dL (ref 30.0–36.0)
MCV: 93.4 fL (ref 80.0–100.0)
MONOS PCT: 10 %
Monocytes Absolute: 0.8 10*3/uL (ref 0.1–1.0)
NEUTROS ABS: 5.4 10*3/uL (ref 1.7–7.7)
NEUTROS PCT: 64 %
NRBC: 0 % (ref 0.0–0.2)
PLATELETS: 167 10*3/uL (ref 150–400)
RBC: 4.07 MIL/uL (ref 3.87–5.11)
RDW: 14.6 % (ref 11.5–15.5)
WBC: 8.4 10*3/uL (ref 4.0–10.5)

## 2018-10-19 LAB — COMPREHENSIVE METABOLIC PANEL
ALBUMIN: 3.9 g/dL (ref 3.5–5.0)
ALT: 13 U/L (ref 0–44)
AST: 23 U/L (ref 15–41)
Alkaline Phosphatase: 77 U/L (ref 38–126)
Anion gap: 5 (ref 5–15)
BUN: 17 mg/dL (ref 8–23)
CALCIUM: 9.8 mg/dL (ref 8.9–10.3)
CHLORIDE: 107 mmol/L (ref 98–111)
CO2: 27 mmol/L (ref 22–32)
Creatinine, Ser: 1.03 mg/dL — ABNORMAL HIGH (ref 0.44–1.00)
GFR calc non Af Amer: 50 mL/min — ABNORMAL LOW (ref >60)
GFR, EST AFRICAN AMERICAN: 57 mL/min — AB (ref >60)
GLUCOSE: 96 mg/dL (ref 70–99)
POTASSIUM: 3.9 mmol/L (ref 3.5–5.1)
Sodium: 139 mmol/L (ref 135–145)
Total Bilirubin: 0.5 mg/dL (ref 0.3–1.2)
Total Protein: 7.1 g/dL (ref 6.5–8.1)

## 2018-10-22 LAB — PROTEIN ELECTROPHORESIS, SERUM
A/G RATIO SPE: 1.2 (ref 0.7–1.7)
ALBUMIN ELP: 3.6 g/dL (ref 2.9–4.4)
ALPHA-2-GLOBULIN: 0.6 g/dL (ref 0.4–1.0)
Alpha-1-Globulin: 0.3 g/dL (ref 0.0–0.4)
BETA GLOBULIN: 0.9 g/dL (ref 0.7–1.3)
GLOBULIN, TOTAL: 3 g/dL (ref 2.2–3.9)
Gamma Globulin: 1.2 g/dL (ref 0.4–1.8)
Total Protein ELP: 6.6 g/dL (ref 6.0–8.5)

## 2018-10-29 ENCOUNTER — Encounter: Payer: Self-pay | Admitting: Oncology

## 2018-10-29 ENCOUNTER — Other Ambulatory Visit: Payer: Self-pay

## 2018-10-29 ENCOUNTER — Inpatient Hospital Stay (HOSPITAL_BASED_OUTPATIENT_CLINIC_OR_DEPARTMENT_OTHER): Payer: Medicare Other | Admitting: Oncology

## 2018-10-29 VITALS — BP 104/60 | HR 67 | Temp 95.2°F | Wt 180.0 lb

## 2018-10-29 DIAGNOSIS — I10 Essential (primary) hypertension: Secondary | ICD-10-CM

## 2018-10-29 DIAGNOSIS — D472 Monoclonal gammopathy: Secondary | ICD-10-CM

## 2018-10-29 NOTE — Progress Notes (Signed)
Patient here today for follow up.  Patient states no new concerns today  

## 2018-10-29 NOTE — Progress Notes (Signed)
Hematology/Oncology follow up note [facility] Telephone:([phone] Fax:([phone]) [phone]   Patient Care Team: Kathrine Haddock, NP as PCP - General (Nurse Practitioner) Marlowe Sax, MD as Referring Physician (Internal Medicine) Earlie Server, MD as Consulting Physician (Oncology)  REFERRING PROVIDER: [facility] Rheymatology REASON FOR VISIT Follow up for management of MGUS  HISTORY OF PRESENTING ILLNESS:  Cynthia Hurst is a  83 y.o.  female with PMH listed below who was referred to me for evaluation of hypercalcemia.  Patient was referred to see a see rheumatology for evaluation of positive ANA, joint pain.  Lab workup reviewed hypercalcemia of 11, normal PTH, normal 25-hydroxy vitamin D level, normal TSH, SPEP showed  monoclonal spike of 0.4.  Immunofixation showed elevated IgA monoclonal protein with kappa light chain specificity. Patient reports feeling well, she denies any fatigue, weight loss, back pain history of fracture.  She has bilateral knee replacement reports having posterior thigh pain bilaterally. Patient denies taking any calcium supplements.  Never smoker  # Bone marrow biopsy results was reviewed with patient and her family members in details.  Showed 2% plasma cells, consistent with MGUS .INTERVAL HISTORY Cynthia Hurst is a 83 y.o. female who has above history reviewed by me today presents for follow-up visit for management of IgA MGUS. Multiple family members including son, daughter accompanied patient to today's visit.  Patient reports no new complaints today. Denies any bone pain, fatigue or weight loss.  Patient also follows up with rheumatology Dr. Meda Coffee for polymyalgia rheumatica syndrome.  Doing well.  Patient has been on prednisone  since June 2019.   Review of Systems  Constitutional: Negative for chills, fever, malaise/fatigue and weight loss.  HENT: Negative for congestion, ear discharge, ear pain, nosebleeds, sinus pain and  sore throat.   Eyes: Negative for double vision, photophobia, pain, discharge and redness.  Respiratory: Negative for cough, hemoptysis, sputum production, shortness of breath and wheezing.   Cardiovascular: Negative for chest pain, palpitations, orthopnea, claudication and leg swelling.  Gastrointestinal: Negative for abdominal pain, blood in stool, constipation, diarrhea, heartburn, melena, nausea and vomiting.  Genitourinary: Negative for dysuria, flank pain, frequency, hematuria and urgency.  Musculoskeletal: Positive for joint pain. Negative for back pain, myalgias and neck pain.  Skin: Negative for itching and rash.  Neurological: Negative for dizziness, tingling, tremors, sensory change, focal weakness, weakness and headaches.  Endo/Heme/Allergies: Negative for environmental allergies. Does not bruise/bleed easily.  Psychiatric/Behavioral: Negative for depression, hallucinations and substance abuse. The patient is not nervous/anxious.     MEDICAL HISTORY:  Past Medical History:  Diagnosis Date  . Chronic kidney disease   . GERD (gastroesophageal reflux disease)   . Gout   . Hypertension   . Hypopotassemia   . Osteoarthritis   . Osteoporosis    LUMBAR SPINE  . Vitamin B 12 deficiency   . Vitamin D deficiency     SURGICAL HISTORY: Past Surgical History:  Procedure Laterality Date  . ABDOMINAL HYSTERECTOMY     PARTIAL  . EYE SURGERY Left 05/2018   cataract surgery   . JOINT REPLACEMENT Bilateral 2010,2011   KNEE    SOCIAL HISTORY: Social History   Socioeconomic History  . Marital status: Married    Spouse name: Not on file  . Number of children: Not on file  . Years of education: Not on file  . Highest education level: High school graduate  Occupational History  . Not on file  Social Needs  . Financial resource strain: Not hard at  all  . Food insecurity:    Worry: Never true    Inability: Never true  . Transportation needs:    Medical: No    Non-medical:  No  Tobacco Use  . Smoking status: Never Smoker  . Smokeless tobacco: Never Used  Substance and Sexual Activity  . Alcohol use: No  . Drug use: No  . Sexual activity: Yes  Lifestyle  . Physical activity:    Days per week: 0 days    Minutes per session: 0 min  . Stress: Not at all  Relationships  . Social connections:    Talks on phone: More than three times a week    Gets together: More than three times a week    Attends religious service: More than 4 times per year    Active member of club or organization: Yes    Attends meetings of clubs or organizations: More than 4 times per year    Relationship status: Married  . Intimate partner violence:    Fear of current or ex partner: No    Emotionally abused: No    Physically abused: No    Forced sexual activity: No  Other Topics Concern  . Not on file  Social History Narrative  . Not on file    FAMILY HISTORY: Family History  Problem Relation Age of Onset  . Heart disease Mother   . Cancer Father   . Alzheimer's disease Father   . Breast cancer Neg Hx     ALLERGIES:  has No Known Allergies.  MEDICATIONS:  Current Outpatient Medications  Medication Sig Dispense Refill  . allopurinol (ZYLOPRIM) 300 MG tablet Take 1 tablet (300 mg total) by mouth daily. 90 tablet 3  . atenolol (TENORMIN) 100 MG tablet Take 1 tablet (100 mg total) by mouth daily. 90 tablet 3  . cholecalciferol (VITAMIN D) 1000 UNITS tablet Take 1,000 Units by mouth daily.    . cyclobenzaprine (FLEXERIL) 10 MG tablet Take 1 tablet (10 mg total) by mouth at bedtime. 30 tablet 1  . NIFEdipine (PROCARDIA-XL/ADALAT CC) 60 MG 24 hr tablet Take 1 tablet (60 mg total) by mouth daily. 90 tablet 0  . omeprazole (PRILOSEC) 20 MG capsule Take 1 capsule (20 mg total) by mouth daily. 90 capsule 3  . omeprazole (PRILOSEC) 20 MG capsule TAKE ONE CAPSULE BY MOUTH DAILY 90 capsule 0  . potassium chloride SA (K-DUR,KLOR-CON) 20 MEQ tablet TAKE ONE TABLET BY MOUTH DAILY 90  tablet 1  . predniSONE (DELTASONE) 5 MG tablet Take 5 mg by mouth daily.  1  . raloxifene (EVISTA) 60 MG tablet TAKE ONE TABLET BY MOUTH DAILY 90 tablet 1  . vitamin B-12 (CYANOCOBALAMIN) 1000 MCG tablet Take 1,000 mcg by mouth daily.     No current facility-administered medications for this visit.      PHYSICAL EXAMINATION: ECOG PERFORMANCE STATUS: 0 - Asymptomatic Vitals:   10/29/18 1105  BP: 104/60  Pulse: 67  Temp: (!) 95.2 F (35.1 C)   Filed Weights   10/29/18 1105  Weight: 180 lb (81.6 kg)    Physical Exam  Constitutional: She is oriented to person, place, and time. No distress.  HENT:  Head: Normocephalic and atraumatic.  Mouth/Throat: Oropharynx is clear and moist.  Eyes: Pupils are equal, round, and reactive to light. EOM are normal. No scleral icterus.  Neck: Normal range of motion. Neck supple.  Cardiovascular: Normal rate, regular rhythm and normal heart sounds.  Pulmonary/Chest: Effort normal and breath sounds normal.  No respiratory distress. She has no wheezes. She has no rales. She exhibits no tenderness.  Abdominal: Soft. Bowel sounds are normal. She exhibits no distension and no mass. There is no tenderness.  Musculoskeletal: Normal range of motion. She exhibits no edema or deformity.  Lymphadenopathy:    She has no cervical adenopathy.  Neurological: She is alert and oriented to person, place, and time. No cranial nerve deficit. Coordination normal.  Skin: Skin is warm and dry. No rash noted. No erythema.  Psychiatric: She has a normal mood and affect. Thought content normal.     LABORATORY DATA:  I have reviewed the data as listed Lab Results  Component Value Date   WBC 8.4 10/19/2018   HGB 12.5 10/19/2018   HCT 38.0 10/19/2018   MCV 93.4 10/19/2018   PLT 167 10/19/2018   Recent Labs    03/21/18 1255 09/05/18 0831 10/19/18 1108  NA 136 144 139  K 4.0 3.9 3.9  CL 104 105 107  CO2 '25 25 27  ' GLUCOSE 118* 74 96  BUN '20 17 17  ' CREATININE  1.04* 1.02* 1.03*  CALCIUM 9.9 9.8 9.8  GFRNONAA 49* 52* 50*  GFRAA 57* 60 57*  PROT 7.4 6.2 7.1  ALBUMIN 3.9 4.0 3.9  AST '19 14 23  ' ALT 11* 7 13  ALKPHOS 85 82 77  BILITOT 0.9 0.6 0.5    03/12/2018 multiple myeloma work up  [facility] clinic: SPEP showed  monoclonal spike of 0.4.  Immunofixation showed elevated IgA monoclonal protein with kappa light chain specificity. Urine light chain ratio elevated at 12.54, no monoclonal spike on UPEP.   Lab Results  Component Value Date   TOTALPROTELP 6.6 10/19/2018   ALBUMINELP 3.6 10/19/2018   A1GS 0.3 10/19/2018   A2GS 0.6 10/19/2018   BETS 0.9 10/19/2018   GAMS 1.2 10/19/2018   MSPIKE Not Observed 10/19/2018   SPEI Comment 10/19/2018   Lab Results  Component Value Date   KPAFRELGTCHN 23.3 (H) 03/21/2018   LAMBDASER 23.9 03/21/2018   KAPLAMBRATIO 12.54 (H) 03/21/2018    ASSESSMENT & PLAN:  1. MGUS (monoclonal gammopathy of unknown significance)    #Labs reviewed and discussed with patient.  M protein not detectable. Stable disease. Nondetectable M protein can be secondary to prednisone. Continue monitor. All questions were answered. The patient knows to call the clinic with any problems questions or concerns.  Return of visit: 6 months, repeat CBC, SPEP 10 days prior to visits.. Total face to face encounter time for this patient visit was 15 min. >50% of the time was  spent in counseling and coordination of care.    Earlie Server, MD, PhD Hematology Oncology [facility] Pager- [phone] 10/29/2018

## 2018-11-13 ENCOUNTER — Other Ambulatory Visit: Payer: Self-pay | Admitting: Physician Assistant

## 2018-11-28 DIAGNOSIS — D472 Monoclonal gammopathy: Secondary | ICD-10-CM | POA: Diagnosis not present

## 2018-11-28 DIAGNOSIS — R0602 Shortness of breath: Secondary | ICD-10-CM | POA: Diagnosis not present

## 2018-11-28 DIAGNOSIS — M353 Polymyalgia rheumatica: Secondary | ICD-10-CM | POA: Diagnosis not present

## 2018-12-17 IMAGING — CR DG BONE SURVEY MET
1 series · 8 of 8 positions shown · non-contrast
Comparison: None.

CLINICAL DATA: Hypercalcemia, abnormal labs.

EXAM:
METASTATIC BONE SURVEY

[Series 1: dg bone survey met · 0.14mm/px · 8 of 23 slices shown]
[im 1/23]
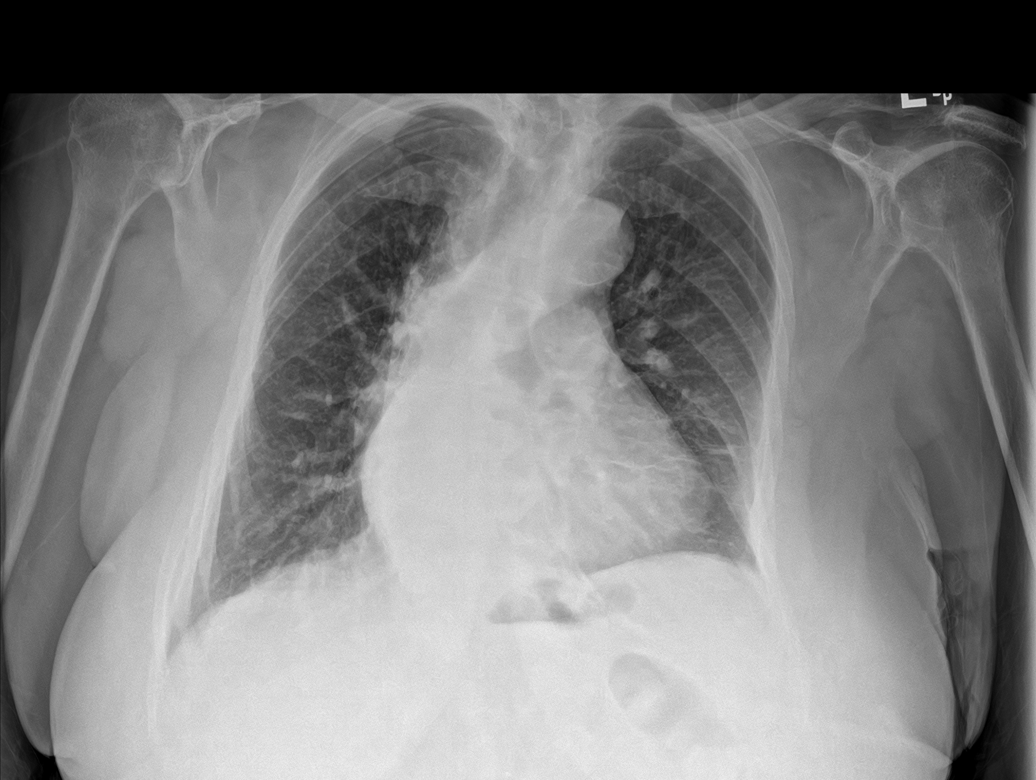
[im 4/23]
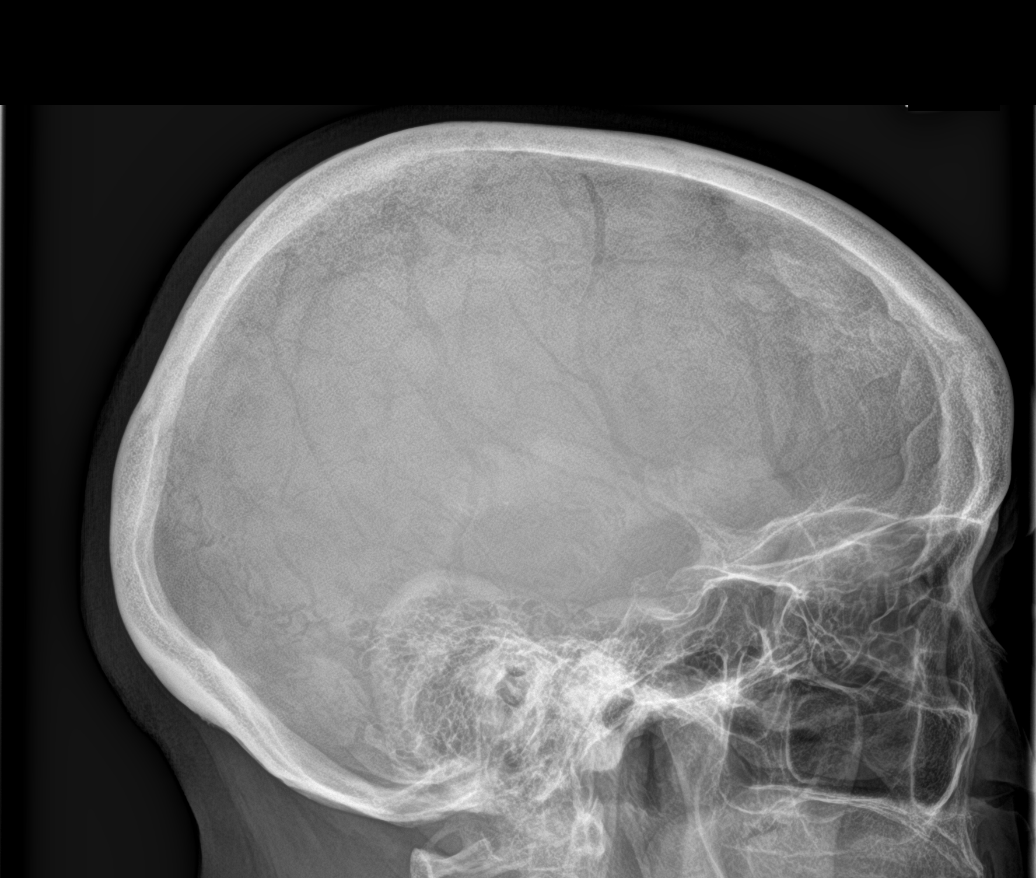
[im 7/23]
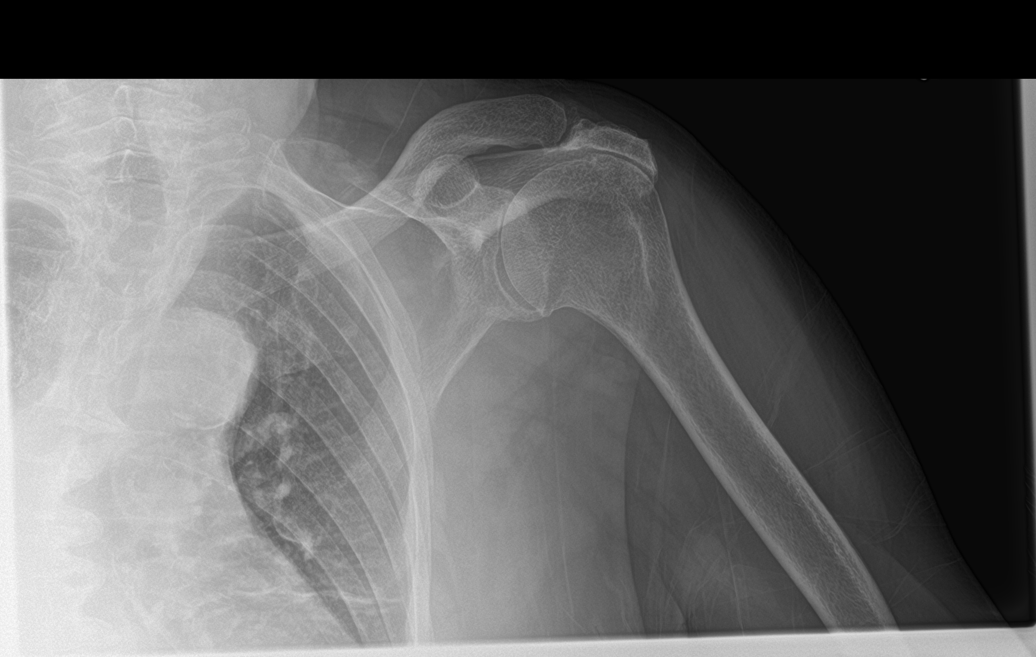
[im 10/23]
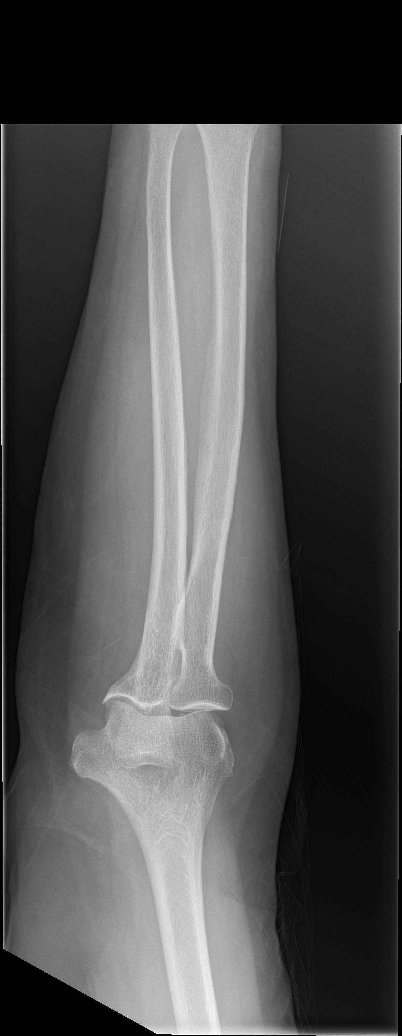
[im 13/23]
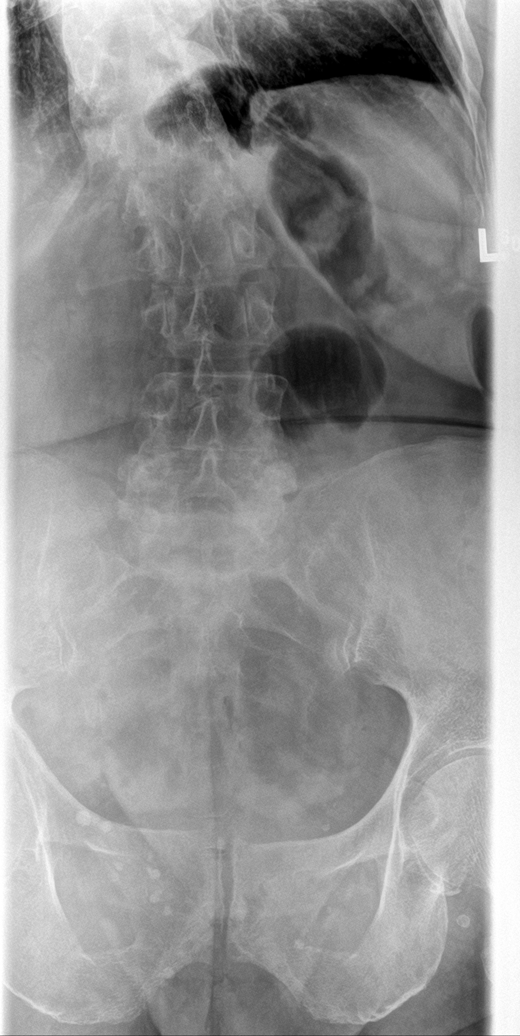
[im 16/23]
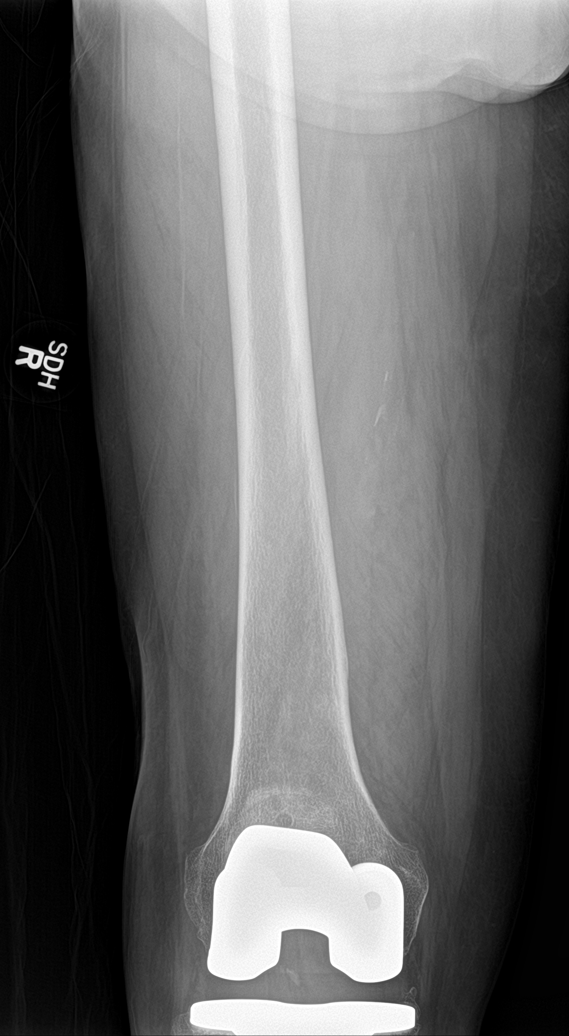
[im 19/23]
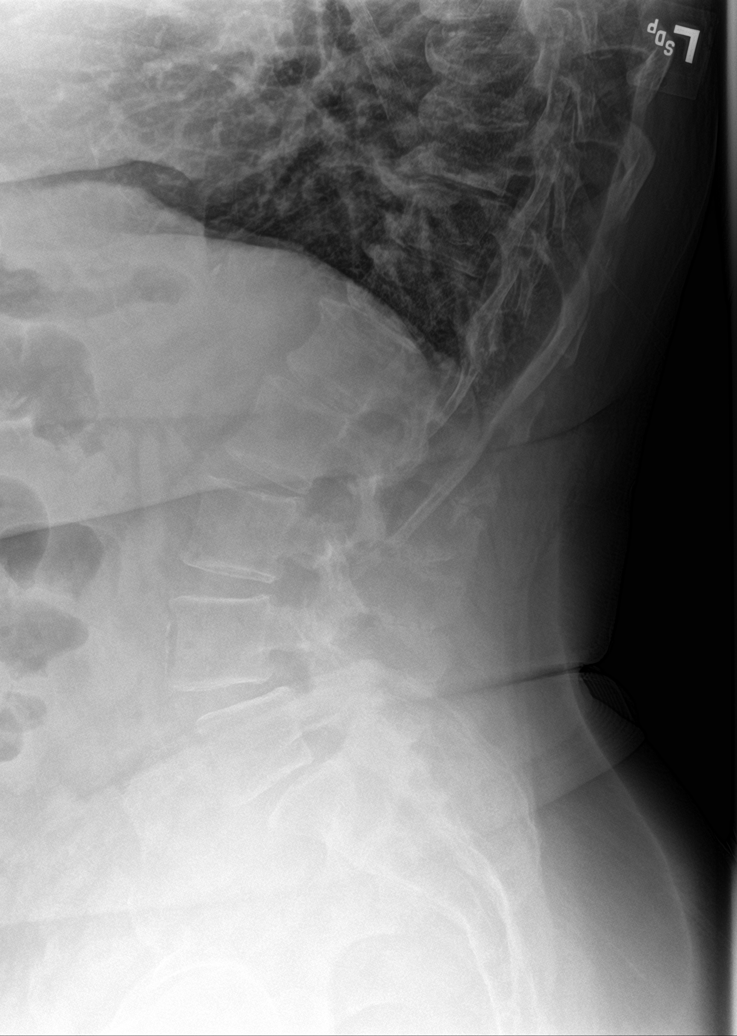
[im 23/23]
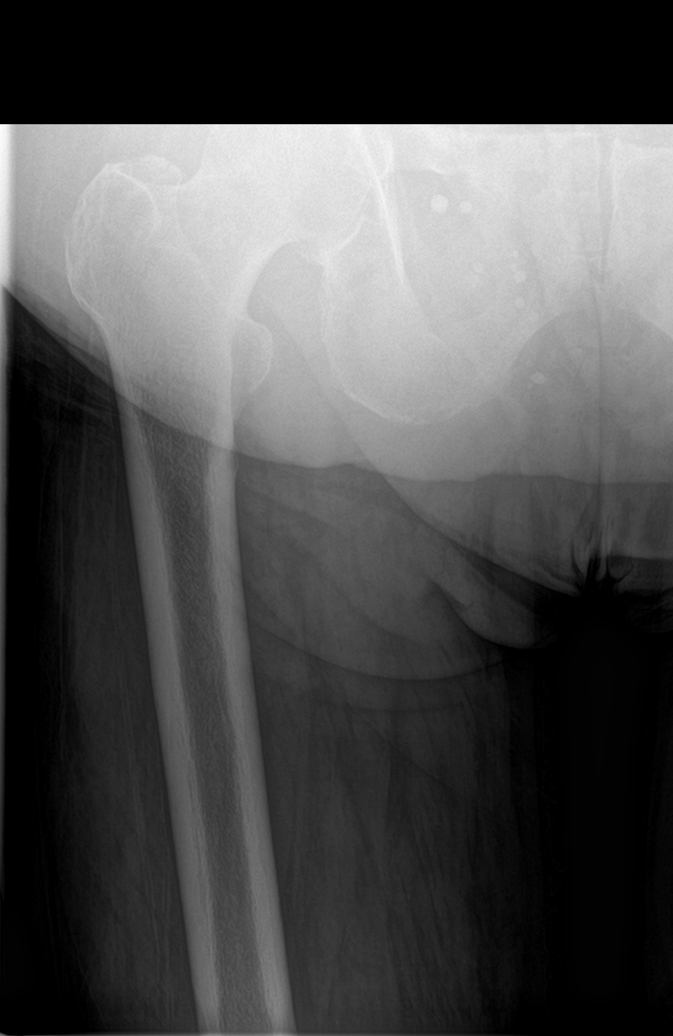

[8 of 8 positions shown; findings below may reference images not displayed]

FINDINGS: Chest x-ray: Cardiomegaly. Presumed age related aortic ectasia and
aortic atherosclerosis. Lungs are clear. Pronounced dextroscoliosis
of the thoracic spine. Osseous structures about the chest are
otherwise unremarkable.

Cervical spine: Mild degenerative spondylitic changes within the mid
cervical spine. Mild levoscoliosis of the cervical spine. No acute
or suspicious osseous finding.

Skull: No acute or suspicious osseous finding.

Upper extremities: Bone mineralization is normal. No acute or
suspicious osseous finding. Elevation of the humeral heads
bilaterally, with narrowing of the acromiohumeral spaces, suggesting
chronic rotator cuff tear or laxity

Thoracic and lumbar spine: Pronounced dextroscoliosis of the
thoracic spine. Moderate degenerative spurring throughout the
midthoracic spine. Additional degenerative changes at the
lumbosacral junction, mild to moderate in degree. No acute or
suspicious osseous finding.

Lower extremities: LEFT knee arthroplasty hardware appears intact
and appropriately position. No acute or suspicious osseous finding.

Osseous pelvis: No acute or suspicious osseous lesion.
IMPRESSION: 1. Skeletal survey reveals no acute or suspicious osseous finding.
2. Cardiomegaly.
3. Aortic atherosclerosis.
4. Additional chronic/incidental findings detailed above.

## 2018-12-25 ENCOUNTER — Telehealth: Payer: Self-pay | Admitting: Unknown Physician Specialty

## 2018-12-25 NOTE — Telephone Encounter (Signed)
Copied from [place] [phone]. Topic: Quick Communication - Rx Refill/Question >> Dec 25, 2018  4:27 PM Reyne Dumas L wrote: Medication:  raloxifene (EVISTA) 60 MG tablet omeprazole (PRILOSEC) 20 MG capsule  Has the patient contacted their pharmacy? Yes - switching to [facility] and they are stating they need a new script from the provider (Agent: If no, request that the patient contact the pharmacy for the refill.) (Agent: If yes, when and what did the pharmacy advise?)  Preferred Pharmacy (with phone number or street name): [place] [place] [phone] (Phone) [phone] (Fax)  Agent: Please be advised that RX refills may take up to 3 business days. We ask that you follow-up with your pharmacy.

## 2018-12-26 ENCOUNTER — Other Ambulatory Visit: Payer: Self-pay | Admitting: Nurse Practitioner

## 2018-12-26 MED ORDER — OMEPRAZOLE 20 MG PO CPDR: 20.0000 mg | DELAYED_RELEASE_CAPSULE | Freq: Every day | ORAL | 3 refills | Status: DC

## 2018-12-26 MED ORDER — RALOXIFENE HCL 60 MG PO TABS: 60.0000 mg | ORAL_TABLET | Freq: Every day | ORAL | 3 refills | Status: DC

## 2018-12-26 NOTE — Progress Notes (Signed)
Refill request for Evista and Prilosec sent.

## 2019-01-06 IMAGING — CT CT BIOPSY
1 of 2 series · 9 of 14 positions shown, 12 images · non-contrast
Comparison: none

CLINICAL DATA: Monoclonal gammopathy of unknown significance

EXAM:
CT GUIDED DEEP ILIAC BONE ASPIRATION AND CORE BIOPSY
TECHNIQUE: Patient was placed supine on the CT gantry and limited axial scans
through the pelvis were obtained. Appropriate skin entry site was
identified. Skin site was marked, prepped with chlorhexidine, draped
in usual sterile fashion, and infiltrated locally with 1% lidocaine.

[Series 2: i-spiral 5.0 b30f · axial · 0.60mm/px · z∈[+760,+826]mm · 9 of 25 slices shown, 12 images]
[im 3/25  soft-tissue]
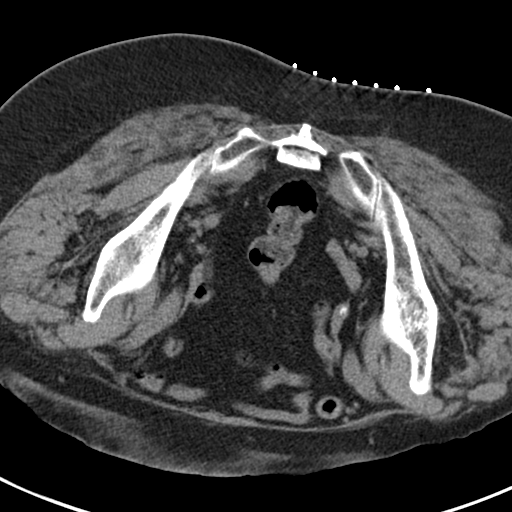
[im 3/25  bone]
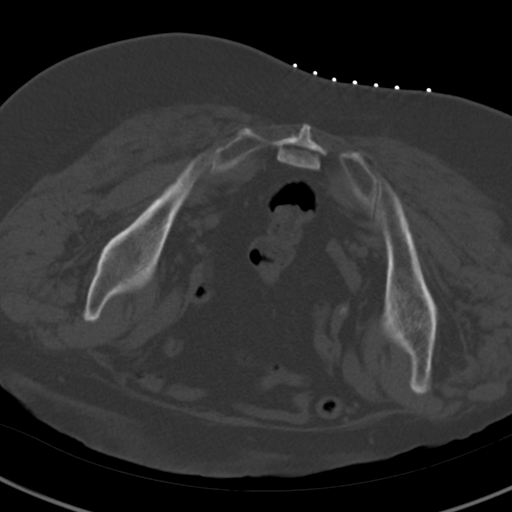
[im 5/25  bone]
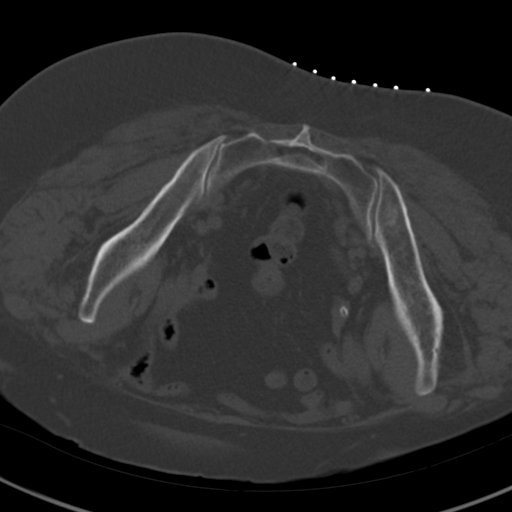
[im 8/25  bone]
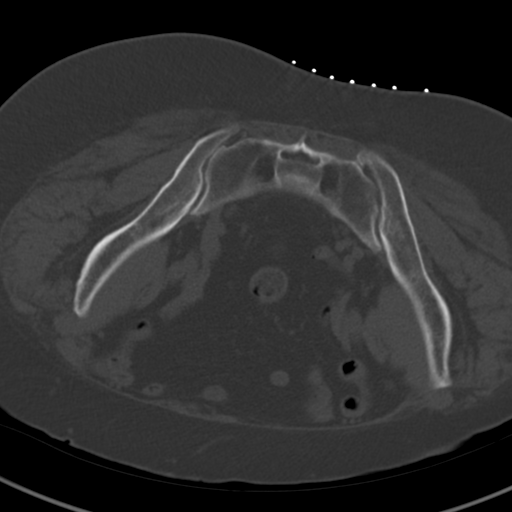
[im 10/25  bone]
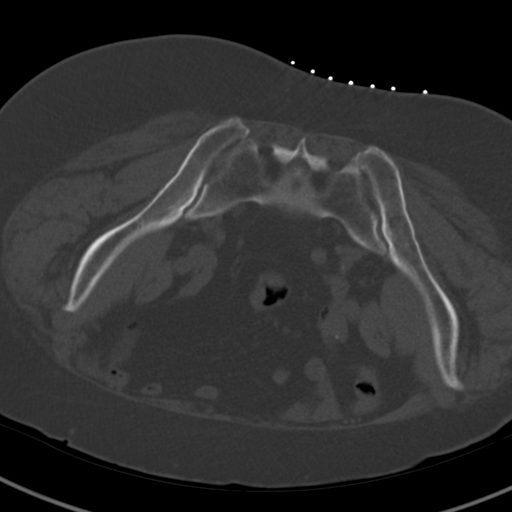
[im 13/25  soft-tissue]
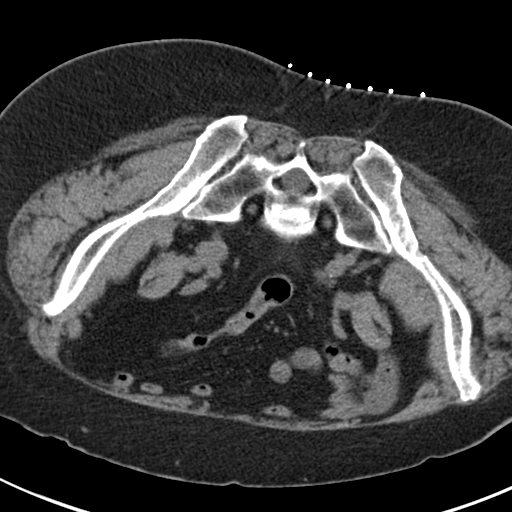
[im 13/25  bone]
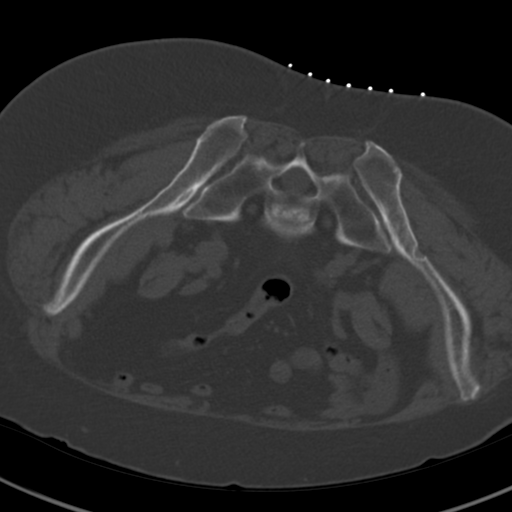
[im 15/25  bone]
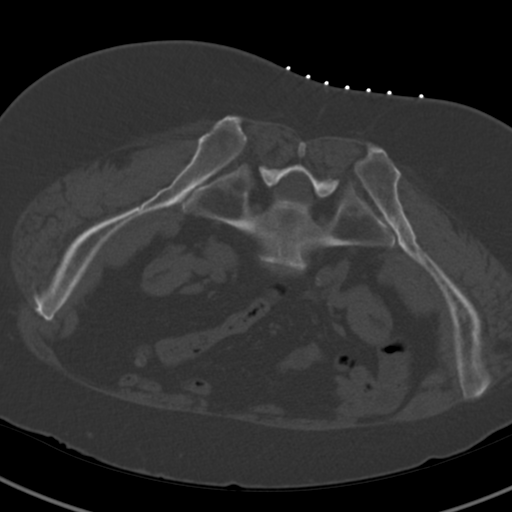
[im 17/25  bone]
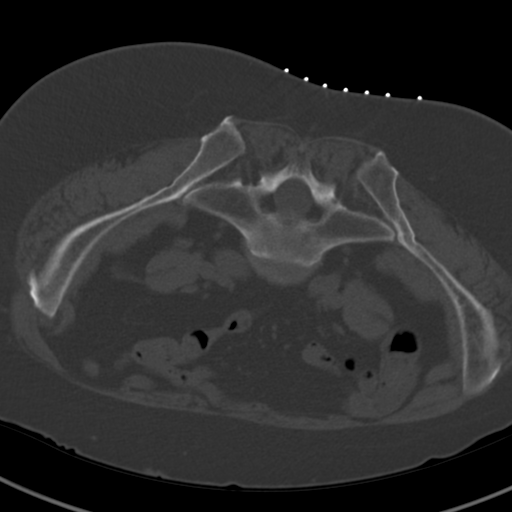
[im 20/25  bone]
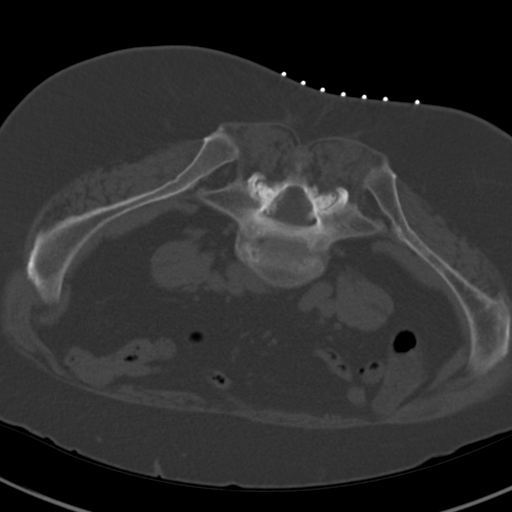
[im 22/25  soft-tissue]
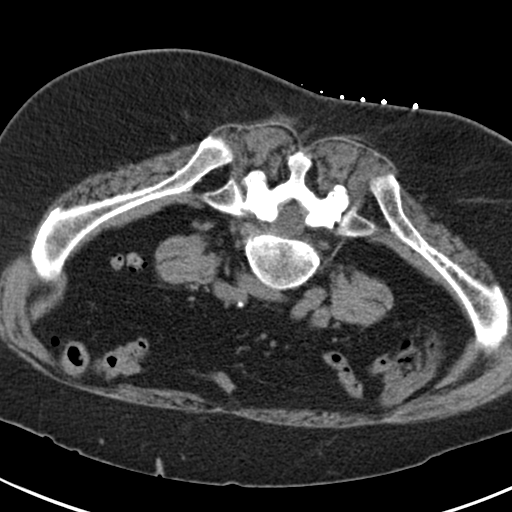
[im 22/25  bone]
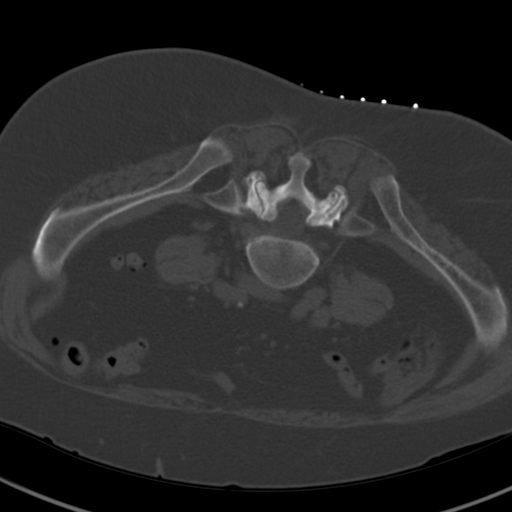

[9 of 14 positions shown; findings below may reference images not displayed]

Intravenous Fentanyl and Versed were administered as conscious
sedation during continuous monitoring of the patient's level of
consciousness and physiological / cardiorespiratory status by the
radiology RN, with a total moderate sedation time of 12 minutes.

Under CT fluoroscopic guidance an 11-gauge Cook trocar bone needle
was advanced into the right iliac bone just lateral to the
sacroiliac joint. Once needle tip position was confirmed, core and
aspiration samples were obtained, submitted to pathology for
approval. Post procedure scans show no hematoma or fracture. Patient
tolerated procedure well.

COMPLICATIONS:
COMPLICATIONS
none
IMPRESSION: 1. Technically successful CT guided right iliac bone core and
aspiration biopsy.

## 2019-01-09 DIAGNOSIS — M353 Polymyalgia rheumatica: Secondary | ICD-10-CM | POA: Diagnosis not present

## 2019-01-24 ENCOUNTER — Other Ambulatory Visit: Payer: Self-pay | Admitting: Unknown Physician Specialty

## 2019-01-24 MED ORDER — ATENOLOL 100 MG PO TABS: 100.0000 mg | ORAL_TABLET | Freq: Every day | ORAL | 0 refills | Status: DC

## 2019-01-24 MED ORDER — ALLOPURINOL 300 MG PO TABS: 300.0000 mg | ORAL_TABLET | Freq: Every day | ORAL | 0 refills | Status: DC

## 2019-01-24 NOTE — Telephone Encounter (Signed)
Copied from [place] [phone]. Topic: Quick Communication - Rx Refill/Question >> Jan 24, 2019  2:30 PM Windy Kalata wrote: Medication: atenolol (TENORMIN) 100 MG tablet allopurinol (ZYLOPRIM) 300 MG tablet,     Has the patient contacted their pharmacy? No. (Agent: If no, request that the patient contact the pharmacy for the refill.) (Agent: If yes, when and what did the pharmacy advise?)  Preferred Pharmacy (with phone number or street name): [place], [place] [phone] (Phone) [phone] (Fax)    Agent: Please be advised that RX refills may take up to 3 business days. We ask that you follow-up with your pharmacy.

## 2019-01-24 NOTE — Telephone Encounter (Signed)
Patient has upcoming appointment with [name]. Will need Rx changed to new PCP. Last OV to continue above medications. Requested Prescriptions  Pending Prescriptions Disp Refills  . allopurinol (ZYLOPRIM) 300 MG tablet 90 tablet 0    Sig: Take 1 tablet (300 mg total) by mouth daily.     Endocrinology:  Gout Agents Failed - 01/24/2019  2:32 PM      Failed - Uric Acid in normal range and within 360 days    Uric Acid  Date Value Ref Range Status  06/10/2015 3.4 2.5 - 7.1 mg/dL Final    Comment:               Therapeutic target for gout patients: <6.0         Failed - Cr in normal range and within 360 days    Creatinine, Ser  Date Value Ref Range Status  10/19/2018 1.03 (H) 0.44 - 1.00 mg/dL Final         Passed - Valid encounter within last 12 months    Recent Outpatient Visits          4 months ago Benign hypertension with chronic kidney disease   [facility] Carles Collet M, PA-C   11 months ago Arthralgia, unspecified joint   [place], [place], [place]   12 months ago Neck pain, acute   [facility] [place], [place], DO   1 year ago Vitamin B12 deficiency   [facility] Kathrine Haddock, NP   1 year ago Vertigo   [place], [place], DO      Future Appointments            In 1 month [name], Barbaraann Faster, NP [employer] [facility], [facility]   In 5 months  [facility]         . atenolol (TENORMIN) 100 MG tablet 90 tablet 0    Sig: Take 1 tablet (100 mg total) by mouth daily.     Cardiovascular:  Beta Blockers Passed - 01/24/2019  2:32 PM      Passed - Last BP in normal range    BP Readings from Last 1 Encounters:  10/29/18 104/60         Passed - Last Heart Rate in normal range    Pulse Readings from Last 1 Encounters:  10/29/18 67         Passed - Valid encounter within last 6 months    Recent Outpatient Visits          4 months ago Benign hypertension with  chronic kidney disease   [facility] Carles Collet M, PA-C   11 months ago Arthralgia, unspecified joint   [place], [place], [place]   12 months ago Neck pain, acute   [place], [place], DO   1 year ago Vitamin B12 deficiency   [facility] Kathrine Haddock, NP   1 year ago Vertigo   [place], [place], DO      Future Appointments            In 1 month [name], Barbaraann Faster, NP [employer], [place]   In 5 months  [employer], [place]

## 2019-01-28 ENCOUNTER — Inpatient Hospital Stay: Payer: Medicare Other | Attending: Oncology

## 2019-01-28 DIAGNOSIS — D472 Monoclonal gammopathy: Secondary | ICD-10-CM

## 2019-01-28 DIAGNOSIS — D696 Thrombocytopenia, unspecified: Secondary | ICD-10-CM | POA: Diagnosis not present

## 2019-01-28 LAB — CBC WITH DIFFERENTIAL/PLATELET
Abs Immature Granulocytes: 0.02 10*3/uL (ref 0.00–0.07)
BASOS PCT: 0 %
Basophils Absolute: 0 10*3/uL (ref 0.0–0.1)
EOS ABS: 0.1 10*3/uL (ref 0.0–0.5)
EOS PCT: 1 %
HCT: 37 % (ref 36.0–46.0)
HEMOGLOBIN: 12.2 g/dL (ref 12.0–15.0)
Immature Granulocytes: 0 %
LYMPHS ABS: 1.7 10*3/uL (ref 0.7–4.0)
Lymphocytes Relative: 22 %
MCH: 31 pg (ref 26.0–34.0)
MCHC: 33 g/dL (ref 30.0–36.0)
MCV: 93.9 fL (ref 80.0–100.0)
MONO ABS: 0.7 10*3/uL (ref 0.1–1.0)
MONOS PCT: 9 %
Neutro Abs: 5.1 10*3/uL (ref 1.7–7.7)
Neutrophils Relative %: 68 %
Platelets: 162 10*3/uL (ref 150–400)
RBC: 3.94 MIL/uL (ref 3.87–5.11)
RDW: 14.7 % (ref 11.5–15.5)
WBC: 7.6 10*3/uL (ref 4.0–10.5)
nRBC: 0 % (ref 0.0–0.2)

## 2019-01-28 LAB — COMPREHENSIVE METABOLIC PANEL
ALBUMIN: 3.8 g/dL (ref 3.5–5.0)
ALK PHOS: 93 U/L (ref 38–126)
ALT: 11 U/L (ref 0–44)
AST: 23 U/L (ref 15–41)
Anion gap: 9 (ref 5–15)
BILIRUBIN TOTAL: 0.9 mg/dL (ref 0.3–1.2)
BUN: 17 mg/dL (ref 8–23)
CALCIUM: 9.7 mg/dL (ref 8.9–10.3)
CO2: 26 mmol/L (ref 22–32)
Chloride: 106 mmol/L (ref 98–111)
Creatinine, Ser: 0.91 mg/dL (ref 0.44–1.00)
GFR calc non Af Amer: 59 mL/min — ABNORMAL LOW (ref >60)
GLUCOSE: 108 mg/dL — AB (ref 70–99)
Potassium: 4.2 mmol/L (ref 3.5–5.1)
SODIUM: 141 mmol/L (ref 135–145)
TOTAL PROTEIN: 7.1 g/dL (ref 6.5–8.1)

## 2019-01-29 LAB — KAPPA/LAMBDA LIGHT CHAINS
Kappa free light chain: 26.9 mg/L — ABNORMAL HIGH (ref 3.3–19.4)
Kappa, lambda light chain ratio: 1.23 (ref 0.26–1.65)
Lambda free light chains: 21.9 mg/L (ref 5.7–26.3)

## 2019-01-30 LAB — MULTIPLE MYELOMA PANEL, SERUM
ALPHA2 GLOB SERPL ELPH-MCNC: 0.5 g/dL (ref 0.4–1.0)
Albumin SerPl Elph-Mcnc: 3.6 g/dL (ref 2.9–4.4)
Albumin/Glob SerPl: 1.3 (ref 0.7–1.7)
Alpha 1: 0.2 g/dL (ref 0.0–0.4)
B-GLOBULIN SERPL ELPH-MCNC: 0.9 g/dL (ref 0.7–1.3)
Gamma Glob SerPl Elph-Mcnc: 1.1 g/dL (ref 0.4–1.8)
Globulin, Total: 2.8 g/dL (ref 2.2–3.9)
IGG (IMMUNOGLOBIN G), SERUM: 1102 mg/dL (ref 700–1600)
IgA: 192 mg/dL (ref 64–422)
IgM (Immunoglobulin M), Srm: 249 mg/dL — ABNORMAL HIGH (ref 26–217)
Total Protein ELP: 6.4 g/dL (ref 6.0–8.5)

## 2019-02-04 ENCOUNTER — Inpatient Hospital Stay: Payer: Medicare Other | Admitting: Oncology

## 2019-02-07 ENCOUNTER — Inpatient Hospital Stay (HOSPITAL_BASED_OUTPATIENT_CLINIC_OR_DEPARTMENT_OTHER): Payer: Medicare Other | Admitting: Oncology

## 2019-02-07 ENCOUNTER — Encounter: Payer: Self-pay | Admitting: Oncology

## 2019-02-07 ENCOUNTER — Other Ambulatory Visit: Payer: Self-pay

## 2019-02-07 VITALS — BP 153/72 | HR 58 | Temp 96.8°F | Resp 18 | Wt 177.7 lb

## 2019-02-07 DIAGNOSIS — D696 Thrombocytopenia, unspecified: Secondary | ICD-10-CM

## 2019-02-07 DIAGNOSIS — D472 Monoclonal gammopathy: Secondary | ICD-10-CM

## 2019-02-07 NOTE — Progress Notes (Signed)
Patient here for follow up. Pt states she stays tired most of the time.

## 2019-02-08 NOTE — Progress Notes (Signed)
Hematology/Oncology follow up note [facility] [phone] Fax:([phone]   Patient Care Team: Kathrine Haddock, NP as PCP - General (Nurse Practitioner) Marlowe Sax, MD as Referring Physician (Internal Medicine) Earlie Server, MD as Consulting Physician (Oncology)  REFERRING PROVIDER: [facility] REASON FOR VISIT Follow up for management of MGUS  HISTORY OF PRESENTING ILLNESS:  Cynthia Hurst is a  84 y.o.  female with PMH listed below who was referred to me for evaluation of hypercalcemia.  Patient was referred to see a see rheumatology for evaluation of positive ANA, joint pain.  Lab workup reviewed hypercalcemia of 11, normal PTH, normal 25-hydroxy vitamin D level, normal TSH, SPEP showed  monoclonal spike of 0.4.  Immunofixation showed elevated IgA monoclonal protein with kappa light chain specificity. Patient reports feeling well, she denies any fatigue, weight loss, back pain history of fracture.  She has bilateral knee replacement reports having posterior thigh pain bilaterally. Patient denies taking any calcium supplements.  Never smoker  # Bone marrow biopsy results was reviewed with patient and her family members in details.  Showed 2% plasma cells, consistent with MGUS .INTERVAL HISTORY Cynthia Hurst is a 84 y.o. female who has above history reviewed by me today presents for follow-up visit for management of IgA MGUS. Patient was accompanied by daughter and husband today's visit.  She reports doing well.  No new complaints today Denies any bone pain, fatigue, weight loss. Patient is on prednisone for polymyalgia rheumatica syndrome.  Doing well.  Has been on prednisone since June 2019.  Follows up with Dr. Meda Coffee at [facility].   Review of Systems  Constitutional: Negative for chills, fever, malaise/fatigue and weight loss.  HENT: Negative for congestion, ear discharge, ear pain, nosebleeds, sinus pain and sore throat.   Eyes:  Negative for double vision, photophobia, pain, discharge and redness.  Respiratory: Negative for cough, hemoptysis, sputum production, shortness of breath and wheezing.   Cardiovascular: Negative for chest pain, palpitations, orthopnea, claudication and leg swelling.  Gastrointestinal: Negative for abdominal pain, blood in stool, constipation, diarrhea, heartburn, melena, nausea and vomiting.  Genitourinary: Negative for dysuria, flank pain, frequency, hematuria and urgency.  Musculoskeletal: Positive for joint pain. Negative for back pain, myalgias and neck pain.  Skin: Negative for itching and rash.  Neurological: Negative for dizziness, tingling, tremors, sensory change, focal weakness, weakness and headaches.  Endo/Heme/Allergies: Negative for environmental allergies. Does not bruise/bleed easily.  Psychiatric/Behavioral: Negative for depression, hallucinations and substance abuse. The patient is not nervous/anxious.     MEDICAL HISTORY:  Past Medical History:  Diagnosis Date  . Chronic kidney disease   . GERD (gastroesophageal reflux disease)   . Gout   . Hypertension   . Hypopotassemia   . Osteoarthritis   . Osteoporosis    LUMBAR SPINE  . Vitamin B 12 deficiency   . Vitamin D deficiency     SURGICAL HISTORY: Past Surgical History:  Procedure Laterality Date  . ABDOMINAL HYSTERECTOMY     PARTIAL  . EYE SURGERY Left 05/2018   cataract surgery   . JOINT REPLACEMENT Bilateral 2010,2011   KNEE    SOCIAL HISTORY: Social History   Socioeconomic History  . Marital status: Married    Spouse name: Not on file  . Number of children: Not on file  . Years of education: Not on file  . Highest education level: High school graduate  Occupational History  . Not on file  Social Needs  . Financial resource strain: Not hard  at all  . Food insecurity:    Worry: Never true    Inability: Never true  . Transportation needs:    Medical: No    Non-medical: No  Tobacco Use  .  Smoking status: Never Smoker  . Smokeless tobacco: Never Used  Substance and Sexual Activity  . Alcohol use: No  . Drug use: No  . Sexual activity: Yes  Lifestyle  . Physical activity:    Days per week: 0 days    Minutes per session: 0 min  . Stress: Not at all  Relationships  . Social connections:    Talks on phone: More than three times a week    Gets together: More than three times a week    Attends religious service: More than 4 times per year    Active member of club or organization: Yes    Attends meetings of clubs or organizations: More than 4 times per year    Relationship status: Married  . Intimate partner violence:    Fear of current or ex partner: No    Emotionally abused: No    Physically abused: No    Forced sexual activity: No  Other Topics Concern  . Not on file  Social History Narrative  . Not on file    FAMILY HISTORY: Family History  Problem Relation Age of Onset  . Heart disease Mother   . Cancer Father   . Alzheimer's disease Father   . Breast cancer Neg Hx     ALLERGIES:  has No Known Allergies.  MEDICATIONS:  Current Outpatient Medications  Medication Sig Dispense Refill  . allopurinol (ZYLOPRIM) 300 MG tablet Take 1 tablet (300 mg total) by mouth daily. 90 tablet 0  . atenolol (TENORMIN) 100 MG tablet Take 1 tablet (100 mg total) by mouth daily. 90 tablet 0  . cholecalciferol (VITAMIN D) 1000 UNITS tablet Take 1,000 Units by mouth daily.    . cyclobenzaprine (FLEXERIL) 10 MG tablet Take 1 tablet (10 mg total) by mouth at bedtime. 30 tablet 1  . NIFEdipine (ADALAT CC) 60 MG 24 hr tablet TAKE ONE TABLET BY MOUTH DAILY 90 tablet 0  . omeprazole (PRILOSEC) 20 MG capsule Take 1 capsule (20 mg total) by mouth daily. 90 capsule 3  . potassium chloride SA (K-DUR,KLOR-CON) 20 MEQ tablet TAKE ONE TABLET BY MOUTH DAILY 90 tablet 1  . predniSONE (DELTASONE) 5 MG tablet Take 5 mg by mouth daily.  1  . raloxifene (EVISTA) 60 MG tablet Take 1 tablet (60 mg  total) by mouth daily. 90 tablet 3  . vitamin B-12 (CYANOCOBALAMIN) 1000 MCG tablet Take 1,000 mcg by mouth daily.     No current facility-administered medications for this visit.      PHYSICAL EXAMINATION: ECOG PERFORMANCE STATUS: 0 - Asymptomatic Vitals:   02/07/19 0905  BP: (!) 153/72  Pulse: (!) 58  Resp: 18  Temp: (!) 96.8 F (36 C)   Filed Weights   02/07/19 0905  Weight: 177 lb 11.2 oz (80.6 kg)    Physical Exam Constitutional:      General: She is not in acute distress. HENT:     Head: Normocephalic and atraumatic.  Eyes:     General: No scleral icterus.    Pupils: Pupils are equal, round, and reactive to light.  Neck:     Musculoskeletal: Normal range of motion and neck supple.  Cardiovascular:     Rate and Rhythm: Normal rate and regular rhythm.  Heart sounds: Normal heart sounds.  Pulmonary:     Effort: Pulmonary effort is normal. No respiratory distress.     Breath sounds: Normal breath sounds. No wheezing or rales.  Chest:     Chest wall: No tenderness.  Abdominal:     General: Bowel sounds are normal. There is no distension.     Palpations: Abdomen is soft. There is no mass.     Tenderness: There is no abdominal tenderness.  Musculoskeletal: Normal range of motion.        General: No deformity.  Lymphadenopathy:     Cervical: No cervical adenopathy.  Skin:    General: Skin is warm and dry.     Findings: No erythema or rash.  Neurological:     Mental Status: She is alert and oriented to person, place, and time.     Cranial Nerves: No cranial nerve deficit.     Coordination: Coordination normal.  Psychiatric:        Behavior: Behavior normal.        Thought Content: Thought content normal.      LABORATORY DATA:  I have reviewed the data as listed Lab Results  Component Value Date   WBC 7.6 01/28/2019   HGB 12.2 01/28/2019   HCT 37.0 01/28/2019   MCV 93.9 01/28/2019   PLT 162 01/28/2019   Recent Labs    09/05/18 0831  10/19/18 1108 01/28/19 1109  NA 144 139 141  K 3.9 3.9 4.2  CL 105 107 106  CO2 _0 GLUCOSE 74 96 108*  BUN _1 CREATININE 1.02* 1.03* 0.91  CALCIUM 9.8 9.8 9.7  GFRNONAA 52* 50* 59*  GFRAA 60 57* >60  PROT 6.2 7.1 7.1  ALBUMIN 4.0 3.9 3.8  AST _2 ALT _3 ALKPHOS 82 77 93  BILITOT 0.6 0.5 0.9    03/12/2018 multiple myeloma work up  [facility]: SPEP showed  monoclonal spike of 0.4.  Immunofixation showed elevated IgA monoclonal protein with kappa light chain specificity.Urine light chain ratio elevated at 12.54, no monoclonal spike on UPEP.   Lab Results  Component Value Date   TOTALPROTELP 6.4 01/28/2019   ALBUMINELP 3.6 10/19/2018   A1GS 0.3 10/19/2018   A2GS 0.6 10/19/2018   BETS 0.9 10/19/2018   GAMS 1.2 10/19/2018   MSPIKE Not Observed 10/19/2018   SPEI Comment 10/19/2018   Lab Results  Component Value Date   KPAFRELGTCHN 26.9 (H) 01/28/2019   LAMBDASER 21.9 01/28/2019   KAPLAMBRATIO 1.23 01/28/2019    ASSESSMENT & PLAN:  1. MGUS (monoclonal gammopathy of unknown significance)   2. Thrombocytopenia ([place])    #Labs reviewed and discussed with patient.  M protein continues to be not detectable.  Likely secondary to prednisone use. Stable disease.  Continue to monitor.  I recommend multiple myeloma labs every 6 months and follow-up in clinic. Very mild thrombocytopenia, continue to monitor.  Repeat lab work in 6 months. All questions were answered. The patient knows to call the clinic with any problems questions or concerns.  Return of visit: 6 months, repeat CBC, SPEP 10 days prior to visits.. Orders Placed This Encounter  Procedures  . Kappa/lambda light chains    Standing Status:   Future    Standing Expiration Date:   02/08/2020  . Multiple Myeloma Panel (SPEP&IFE w/QIG)    Standing Status:   Future    Standing Expiration Date:   02/08/2020  . CBC with Differential/Platelet  Standing Status:   Future    Standing Expiration Date:    02/08/2020  . Comprehensive metabolic panel    Standing Status:   Future    Standing Expiration Date:   02/08/2020    We spent sufficient time to discuss many aspect of care, questions were answered to patient's satisfaction. Total face to face encounter time for this patient visit was 15 min. >50% of the time was  spent in counseling and coordination of care. Earlie Server, MD, PhD Hematology Oncology [facility] at [facility] Pager- [phone] 02/08/2019

## 2019-02-19 ENCOUNTER — Other Ambulatory Visit: Payer: Self-pay | Admitting: Nurse Practitioner

## 2019-02-19 ENCOUNTER — Telehealth: Payer: Self-pay | Admitting: Unknown Physician Specialty

## 2019-02-19 MED ORDER — NIFEDIPINE ER 60 MG PO TB24: 60.0000 mg | ORAL_TABLET | Freq: Every day | ORAL | 3 refills | Status: DC

## 2019-02-19 NOTE — Telephone Encounter (Signed)
Copied from [place] [id number]. Topic: Quick Communication - Rx Refill/Question >> Feb 19, 2019 10:27 AM Virl Axe D wrote: Medication: NIFEdipine (ADALAT CC) 60 MG 24 hr tablet / Pt is now using new pharmacy, [facility]. They need new rx from PCP  Has the patient contacted their pharmacy? No. (Agent: If no, request that the patient contact the pharmacy for the refill.) (Agent: If yes, when and what did the pharmacy advise?)  Preferred Pharmacy (with phone number or street name): [place], [place] [phone] (Phone) [phone] (Fax)    Agent: Please be advised that RX refills may take up to 3 business days. We ask that you follow-up with your pharmacy.

## 2019-02-19 NOTE — Progress Notes (Signed)
Adalat refill sent

## 2019-02-19 NOTE — Telephone Encounter (Signed)
Complete

## 2019-02-25 ENCOUNTER — Other Ambulatory Visit: Payer: Self-pay | Admitting: Nurse Practitioner

## 2019-02-25 ENCOUNTER — Telehealth: Payer: Self-pay | Admitting: Unknown Physician Specialty

## 2019-02-25 MED ORDER — POTASSIUM CHLORIDE CRYS ER 20 MEQ PO TBCR: 20.0000 meq | EXTENDED_RELEASE_TABLET | Freq: Every day | ORAL | 1 refills | Status: DC

## 2019-02-25 NOTE — Progress Notes (Signed)
Potassium refill sent 

## 2019-02-25 NOTE — Telephone Encounter (Signed)
Copied from [place] [id number]. Topic: Quick Communication - Rx Refill/Question >> Feb 25, 2019  9:36 AM Sheppard Coil, [employer] L wrote: Medication: potassium chloride SA (K-DUR,KLOR-CON) 20 MEQ tablet  Has the patient contacted their pharmacy? Yes - no refills left at time (Agent: If no, request that the patient contact the pharmacy for the refill.) (Agent: If yes, when and what did the pharmacy advise?)  Preferred Pharmacy (with phone number or street name): [place], [place] [phone] (Phone) [phone] (Fax)  Agent: Please be advised that RX refills may take up to 3 business days. We ask that you follow-up with your pharmacy.

## 2019-03-06 ENCOUNTER — Ambulatory Visit: Payer: Medicare Other | Admitting: Nurse Practitioner

## 2019-04-01 ENCOUNTER — Other Ambulatory Visit: Payer: Self-pay | Admitting: Unknown Physician Specialty

## 2019-05-28 DIAGNOSIS — M353 Polymyalgia rheumatica: Secondary | ICD-10-CM | POA: Diagnosis not present

## 2019-05-28 DIAGNOSIS — D472 Monoclonal gammopathy: Secondary | ICD-10-CM | POA: Diagnosis not present

## 2019-06-11 ENCOUNTER — Other Ambulatory Visit: Payer: Self-pay

## 2019-06-11 ENCOUNTER — Ambulatory Visit (INDEPENDENT_AMBULATORY_CARE_PROVIDER_SITE_OTHER): Payer: Medicare Other | Admitting: Nurse Practitioner

## 2019-06-11 ENCOUNTER — Encounter: Payer: Self-pay | Admitting: Nurse Practitioner

## 2019-06-11 VITALS — BP 132/74 | HR 59 | Temp 97.9°F

## 2019-06-11 DIAGNOSIS — I129 Hypertensive chronic kidney disease with stage 1 through stage 4 chronic kidney disease, or unspecified chronic kidney disease: Secondary | ICD-10-CM | POA: Diagnosis not present

## 2019-06-11 NOTE — Progress Notes (Signed)
BP 132/74   Pulse (!) 59   Temp 97.9 F (36.6 C) (Oral)   LMP  (LMP Unknown)   SpO2 97%    Subjective:    Patient ID: Cynthia Hurst, female    DOB: [birth year], 84 y.o.   MRN: [id number]  HPI: Cynthia Hurst is a 84 y.o. female  Chief Complaint  Patient presents with  . Hypertension   HYPERTENSION Continues to take Atenolol and Nifedipine daily.   Hypertension status: controlled  Satisfied with current treatment? no Duration of hypertension: chronic BP monitoring frequency:  monthly BP range: 120-130/70's at home BP medication side effects:  no Medication compliance: good compliance Aspirin: no Recurrent headaches: no Visual changes: no Palpitations: no Dyspnea: no Chest pain: no Lower extremity edema: occasionally Dizzy/lightheaded: no  Relevant past medical, surgical, family and social history reviewed and updated as indicated. Interim medical history since our last visit reviewed. Allergies and medications reviewed and updated.  Review of Systems  Constitutional: Negative for activity change, appetite change, diaphoresis, fatigue and fever.  Respiratory: Negative for cough, chest tightness and shortness of breath.   Cardiovascular: Negative for chest pain, palpitations and leg swelling.  Gastrointestinal: Negative for abdominal distention, abdominal pain, constipation, diarrhea, nausea and vomiting.  Endocrine: Negative for cold intolerance, heat intolerance, polydipsia, polyphagia and polyuria.  Neurological: Negative for dizziness, syncope, weakness, light-headedness, numbness and headaches.  Psychiatric/Behavioral: Negative.     Per HPI unless specifically indicated above     Objective:    BP 132/74   Pulse (!) 59   Temp 97.9 F (36.6 C) (Oral)   LMP  (LMP Unknown)   SpO2 97%   Wt Readings from Last 3 Encounters:  02/07/19 177 lb 11.2 oz (80.6 kg)  10/29/18 180 lb (81.6 kg)  09/05/18 180 lb 6.4 oz (81.8 kg)    Physical Exam Vitals signs and  nursing note reviewed.  Constitutional:      General: She is awake. She is not in acute distress.    Appearance: She is well-developed. She is not ill-appearing.  HENT:     Head: Normocephalic.     Right Ear: Hearing normal.     Left Ear: Hearing normal.     Nose: Nose normal.     Mouth/Throat:     Mouth: Mucous membranes are moist.  Eyes:     General: Lids are normal.        Right eye: No discharge.        Left eye: No discharge.     Conjunctiva/sclera: Conjunctivae normal.     Pupils: Pupils are equal, round, and reactive to light.  Neck:     Musculoskeletal: Normal range of motion and neck supple.     Thyroid: No thyromegaly.     Vascular: No carotid bruit or JVD.  Cardiovascular:     Rate and Rhythm: Normal rate and regular rhythm.     Heart sounds: Normal heart sounds. No murmur. No gallop.   Pulmonary:     Effort: Pulmonary effort is normal. No accessory muscle usage or respiratory distress.     Breath sounds: Normal breath sounds.  Abdominal:     General: Bowel sounds are normal.     Palpations: Abdomen is soft. There is no hepatomegaly or splenomegaly.  Musculoskeletal:     Right lower leg: Edema (trace) present.     Left lower leg: Edema (trace) present.  Lymphadenopathy:     Cervical: No cervical adenopathy.  Skin:    General:  Skin is warm and dry.  Neurological:     Mental Status: She is alert and oriented to person, place, and time.     Deep Tendon Reflexes: Reflexes are normal and symmetric.  Psychiatric:        Attention and Perception: Attention normal.        Mood and Affect: Mood normal.        Speech: Speech normal.        Behavior: Behavior normal. Behavior is cooperative.        Thought Content: Thought content normal.        Judgment: Judgment normal.     Results for orders placed or performed in visit on 01/28/19  Kappa/lambda light chains  Result Value Ref Range   Kappa free light chain 26.9 (H) 3.3 - 19.4 mg/L   Lamda free light chains  21.9 5.7 - 26.3 mg/L   Kappa, lamda light chain ratio 1.23 0.26 - 1.65  Comprehensive metabolic panel  Result Value Ref Range   Sodium 141 135 - 145 mmol/L   Potassium 4.2 3.5 - 5.1 mmol/L   Chloride 106 98 - 111 mmol/L   CO2 26 22 - 32 mmol/L   Glucose, Bld 108 (H) 70 - 99 mg/dL   BUN 17 8 - 23 mg/dL   Creatinine, Ser 0.91 0.44 - 1.00 mg/dL   Calcium 9.7 8.9 - 10.3 mg/dL   Total Protein 7.1 6.5 - 8.1 g/dL   Albumin 3.8 3.5 - 5.0 g/dL   AST 23 15 - 41 U/L   ALT 11 0 - 44 U/L   Alkaline Phosphatase 93 38 - 126 U/L   Total Bilirubin 0.9 0.3 - 1.2 mg/dL   GFR calc non Af Amer 59 (L) >60 mL/min   GFR calc Af Amer >60 >60 mL/min   Anion gap 9 5 - 15  CBC with Differential/Platelet  Result Value Ref Range   WBC 7.6 4.0 - 10.5 K/uL   RBC 3.94 3.87 - 5.11 MIL/uL   Hemoglobin 12.2 12.0 - 15.0 g/dL   HCT 37.0 36.0 - 46.0 %   MCV 93.9 80.0 - 100.0 fL   MCH 31.0 26.0 - 34.0 pg   MCHC 33.0 30.0 - 36.0 g/dL   RDW 14.7 11.5 - 15.5 %   Platelets 162 150 - 400 K/uL   nRBC 0.0 0.0 - 0.2 %   Neutrophils Relative % 68 %   Neutro Abs 5.1 1.7 - 7.7 K/uL   Lymphocytes Relative 22 %   Lymphs Abs 1.7 0.7 - 4.0 K/uL   Monocytes Relative 9 %   Monocytes Absolute 0.7 0.1 - 1.0 K/uL   Eosinophils Relative 1 %   Eosinophils Absolute 0.1 0.0 - 0.5 K/uL   Basophils Relative 0 %   Basophils Absolute 0.0 0.0 - 0.1 K/uL   Immature Granulocytes 0 %   Abs Immature Granulocytes 0.02 0.00 - 0.07 K/uL  Multiple Myeloma Panel (SPEP&IFE w/QIG)  Result Value Ref Range   IgG (Immunoglobin G), Serum 1,102 700 - 1,600 mg/dL   IgA 192 64 - 422 mg/dL   IgM (Immunoglobulin M), Srm 249 (H) 26 - 217 mg/dL   Total Protein ELP 6.4 6.0 - 8.5 g/dL   Albumin SerPl Elph-Mcnc 3.6 2.9 - 4.4 g/dL   Alpha 1 0.2 0.0 - 0.4 g/dL   Alpha2 Glob SerPl Elph-Mcnc 0.5 0.4 - 1.0 g/dL   B-Globulin SerPl Elph-Mcnc 0.9 0.7 - 1.3 g/dL   Gamma Glob SerPl Elph-Mcnc 1.1 0.4 -  1.8 g/dL   M Protein SerPl Elph-Mcnc Not Observed Not Observed  g/dL   Globulin, Total 2.8 2.2 - 3.9 g/dL   Albumin/Glob SerPl 1.3 0.7 - 1.7   IFE 1 Comment    Please Note Comment       Assessment & Plan:   Problem List Items Addressed This Visit      Cardiovascular and Mediastinum   Benign hypertension with chronic kidney disease - Primary    Chronic, ongoing.  BP at goal at home and in office today.  Continue current medication regimen.  Monitor edema and if worsening consider further labs and EKG.  CMP today.      Relevant Orders   Comp Met (CMET)       Follow up plan: Return in about 3 months (around 09/11/2019) for HTN and fatigue.

## 2019-06-11 NOTE — Assessment & Plan Note (Signed)
Chronic, ongoing.  BP at goal at home and in office today.  Continue current medication regimen.  Monitor edema and if worsening consider further labs and EKG.  CMP today.

## 2019-06-11 NOTE — Patient Instructions (Signed)
Compression hose (TED hose)  DASH Eating Plan DASH stands for "Dietary Approaches to Stop Hypertension." The DASH eating plan is a healthy eating plan that has been shown to reduce high blood pressure (hypertension). It may also reduce your risk for type 2 diabetes, heart disease, and stroke. The DASH eating plan may also help with weight loss. What are tips for following this plan?  General guidelines  Avoid eating more than 2,300 mg (milligrams) of salt (sodium) a day. If you have hypertension, you may need to reduce your sodium intake to 1,500 mg a day.  Limit alcohol intake to no more than 1 drink a day for nonpregnant women and 2 drinks a day for men. One drink equals 12 oz of beer, 5 oz of wine, or 1 oz of hard liquor.  Work with your health care provider to maintain a healthy body weight or to lose weight. Ask what an ideal weight is for you.  Get at least 30 minutes of exercise that causes your heart to beat faster (aerobic exercise) most days of the week. Activities may include walking, swimming, or biking.  Work with your health care provider or diet and nutrition specialist (dietitian) to adjust your eating plan to your individual calorie needs. Reading food labels   Check food labels for the amount of sodium per serving. Choose foods with less than 5 percent of the Daily Value of sodium. Generally, foods with less than 300 mg of sodium per serving fit into this eating plan.  To find whole grains, look for the word "whole" as the first word in the ingredient list. Shopping  Buy products labeled as "low-sodium" or "no salt added."  Buy fresh foods. Avoid canned foods and premade or frozen meals. Cooking  Avoid adding salt when cooking. Use salt-free seasonings or herbs instead of table salt or sea salt. Check with your health care provider or pharmacist before using salt substitutes.  Do not fry foods. Cook foods using healthy methods such as baking, boiling, grilling, and  broiling instead.  Cook with heart-healthy oils, such as olive, canola, soybean, or sunflower oil. Meal planning  Eat a balanced diet that includes: ? 5 or more servings of fruits and vegetables each day. At each meal, try to fill half of your plate with fruits and vegetables. ? Up to 6-8 servings of whole grains each day. ? Less than 6 oz of lean meat, poultry, or fish each day. A 3-oz serving of meat is about the same size as a deck of cards. One egg equals 1 oz. ? 2 servings of low-fat dairy each day. ? A serving of nuts, seeds, or beans 5 times each week. ? Heart-healthy fats. Healthy fats called Omega-3 fatty acids are found in foods such as flaxseeds and coldwater fish, like sardines, salmon, and mackerel.  Limit how much you eat of the following: ? Canned or prepackaged foods. ? Food that is high in trans fat, such as fried foods. ? Food that is high in saturated fat, such as fatty meat. ? Sweets, desserts, sugary drinks, and other foods with added sugar. ? Full-fat dairy products.  Do not salt foods before eating.  Try to eat at least 2 vegetarian meals each week.  Eat more home-cooked food and less restaurant, buffet, and fast food.  When eating at a restaurant, ask that your food be prepared with less salt or no salt, if possible. What foods are recommended? The items listed may not be a complete list.  Talk with your dietitian about what dietary choices are best for you. Grains Whole-grain or whole-wheat bread. Whole-grain or whole-wheat pasta. Brown rice. [place]. Bulgur. Whole-grain and low-sodium cereals. Pita bread. Low-fat, low-sodium crackers. Whole-wheat flour tortillas. Vegetables Fresh or frozen vegetables (raw, steamed, roasted, or grilled). Low-sodium or reduced-sodium tomato and vegetable juice. Low-sodium or reduced-sodium tomato sauce and tomato paste. Low-sodium or reduced-sodium canned vegetables. Fruits All fresh, dried, or frozen fruit. Canned  fruit in natural juice (without added sugar). Meat and other protein foods Skinless chicken or [place]. Ground chicken or [place]. Pork with fat trimmed off. Fish and seafood. Egg whites. Dried beans, peas, or lentils. Unsalted nuts, nut butters, and seeds. Unsalted canned beans. Lean cuts of beef with fat trimmed off. Low-sodium, lean deli meat. Dairy Low-fat (1%) or fat-free (skim) milk. Fat-free, low-fat, or reduced-fat cheeses. Nonfat, low-sodium ricotta or cottage cheese. Low-fat or nonfat yogurt. Low-fat, low-sodium cheese. Fats and oils Soft margarine without trans fats. Vegetable oil. Low-fat, reduced-fat, or light mayonnaise and salad dressings (reduced-sodium). Canola, safflower, olive, soybean, and sunflower oils. Avocado. Seasoning and other foods Herbs. Spices. Seasoning mixes without salt. Unsalted popcorn and pretzels. Fat-free sweets. What foods are not recommended? The items listed may not be a complete list. Talk with your dietitian about what dietary choices are best for you. Grains Baked goods made with fat, such as croissants, muffins, or some breads. Dry pasta or rice meal packs. Vegetables Creamed or fried vegetables. Vegetables in a cheese sauce. Regular canned vegetables (not low-sodium or reduced-sodium). Regular canned tomato sauce and paste (not low-sodium or reduced-sodium). Regular tomato and vegetable juice (not low-sodium or reduced-sodium). Angie Fava. Olives. Fruits Canned fruit in a light or heavy syrup. Fried fruit. Fruit in cream or butter sauce. Meat and other protein foods Fatty cuts of meat. Ribs. Fried meat. Berniece Salines. Sausage. Bologna and other processed lunch meats. Salami. Fatback. Hotdogs. Bratwurst. Salted nuts and seeds. Canned beans with added salt. Canned or smoked fish. Whole eggs or egg yolks. Chicken or [place] with skin. Dairy Whole or 2% milk, cream, and half-and-half. Whole or full-fat cream cheese. Whole-fat or sweetened yogurt. Full-fat cheese.  Nondairy creamers. Whipped toppings. Processed cheese and cheese spreads. Fats and oils Butter. Stick margarine. Lard. Shortening. Ghee. Bacon fat. Tropical oils, such as coconut, palm kernel, or palm oil. Seasoning and other foods Salted popcorn and pretzels. Onion salt, garlic salt, seasoned salt, table salt, and sea salt. Worcestershire sauce. Tartar sauce. Barbecue sauce. Teriyaki sauce. Soy sauce, including reduced-sodium. Steak sauce. Canned and packaged gravies. Fish sauce. Oyster sauce. Cocktail sauce. Horseradish that you find on the shelf. Ketchup. Mustard. Meat flavorings and tenderizers. Bouillon cubes. Hot sauce and Tabasco sauce. Premade or packaged marinades. Premade or packaged taco seasonings. Relishes. Regular salad dressings. Where to find more information:  National Heart, Lung, and [place]: https://wilson-eaton.com/  American Heart Association: www.heart.org Summary  The DASH eating plan is a healthy eating plan that has been shown to reduce high blood pressure (hypertension). It may also reduce your risk for type 2 diabetes, heart disease, and stroke.  With the DASH eating plan, you should limit salt (sodium) intake to 2,300 mg a day. If you have hypertension, you may need to reduce your sodium intake to 1,500 mg a day.  When on the DASH eating plan, aim to eat more fresh fruits and vegetables, whole grains, lean proteins, low-fat dairy, and heart-healthy fats.  Work with your health care provider or diet and nutrition specialist (dietitian) to adjust  your eating plan to your individual calorie needs. This information is not intended to replace advice given to you by your health care provider. Make sure you discuss any questions you have with your health care provider. Document Released: 11/17/2011 Document Revised: 11/10/2017 Document Reviewed: 11/21/2016 Elsevier Patient Education  2020 [employer].

## 2019-06-12 LAB — COMPREHENSIVE METABOLIC PANEL
ALT: 8 IU/L (ref 0–32)
AST: 19 IU/L (ref 0–40)
Albumin/Globulin Ratio: 1.6 (ref 1.2–2.2)
Albumin: 4 g/dL (ref 3.6–4.6)
Alkaline Phosphatase: 103 IU/L (ref 39–117)
BUN/Creatinine Ratio: 18 (ref 12–28)
BUN: 18 mg/dL (ref 8–27)
Bilirubin Total: 0.7 mg/dL (ref 0.0–1.2)
CO2: 25 mmol/L (ref 20–29)
Calcium: 9.9 mg/dL (ref 8.7–10.3)
Chloride: 103 mmol/L (ref 96–106)
Creatinine, Ser: 1 mg/dL (ref 0.57–1.00)
GFR calc Af Amer: 61 mL/min/{1.73_m2} (ref >59)
GFR calc non Af Amer: 53 mL/min/{1.73_m2} — ABNORMAL LOW (ref >59)
Globulin, Total: 2.5 g/dL (ref 1.5–4.5)
Glucose: 86 mg/dL (ref 65–99)
Potassium: 4.1 mmol/L (ref 3.5–5.2)
Sodium: 141 mmol/L (ref 134–144)
Total Protein: 6.5 g/dL (ref 6.0–8.5)

## 2019-06-12 NOTE — Progress Notes (Signed)
Normal test results noted.  Please call patient and make them aware of normal results and will continue to monitor at regular visits.  Have a great day.  Look forward to seeing you at your next visit.

## 2019-06-20 ENCOUNTER — Telehealth: Payer: Self-pay | Admitting: *Deleted

## 2019-06-20 NOTE — Telephone Encounter (Signed)
Pain resolved or is persistent? I don't see this was documented in PCP's note.  If persistent pain, she should contact her PCP again. Wonder if she has UTI or kidney stone. Thanks.

## 2019-06-20 NOTE — Telephone Encounter (Signed)
Patient advised to contact PCP. She states she will call PCP

## 2019-06-20 NOTE — Telephone Encounter (Signed)
Patient called reporting that she is having left flank pain. The pain goes away. She had her physical 6/30 and states she had the pain at that time, her PCP drew labs and "called her that there were OK" She is asking Dr Cynthia Hurst for guidance on what to do about this pain. Please advise

## 2019-06-21 ENCOUNTER — Encounter: Payer: Self-pay | Admitting: Family Medicine

## 2019-06-21 ENCOUNTER — Other Ambulatory Visit: Payer: Self-pay

## 2019-06-21 ENCOUNTER — Ambulatory Visit (INDEPENDENT_AMBULATORY_CARE_PROVIDER_SITE_OTHER): Payer: Medicare Other | Admitting: Family Medicine

## 2019-06-21 VITALS — BP 111/63 | HR 62 | Temp 98.0°F | Wt 174.0 lb

## 2019-06-21 DIAGNOSIS — S39012A Strain of muscle, fascia and tendon of lower back, initial encounter: Secondary | ICD-10-CM

## 2019-06-21 DIAGNOSIS — R109 Unspecified abdominal pain: Secondary | ICD-10-CM

## 2019-06-21 LAB — UA/M W/RFLX CULTURE, ROUTINE
Bilirubin, UA: NEGATIVE
Glucose, UA: NEGATIVE
Ketones, UA: NEGATIVE
Leukocytes,UA: NEGATIVE
Nitrite, UA: NEGATIVE
Protein,UA: NEGATIVE
RBC, UA: NEGATIVE
Specific Gravity, UA: 1.01 (ref 1.005–1.030)
Urobilinogen, Ur: 0.2 mg/dL (ref 0.2–1.0)
pH, UA: 7 (ref 5.0–7.5)

## 2019-06-21 NOTE — Progress Notes (Signed)
BP 111/63   Pulse 62   Temp 98 F (36.7 C) (Oral)   Wt 174 lb (78.9 kg)   LMP  (LMP Unknown)   SpO2 96%   BMI 28.96 kg/m    Subjective:    Patient ID: Cynthia Hurst, female    DOB: 04/22/1935, 84 y.o.   MRN: [id number]  HPI: Cynthia Hurst is a 84 y.o. female  Chief Complaint  Patient presents with  . Back Pain    middle left side. pt states when she goes to the restroom pain gets better  . Urinary Frequency    mostly during the nigh    Presenting today with left flank pain for about a month now, only happens when she has to urinate or when she turns certain ways. Seems to go away soon after she urinates. Denies dysuria, hematuria, abdominal pain, nausea, vomiting, injuries. Not really taking anything for sxs.   Relevant past medical, surgical, family and social history reviewed and updated as indicated. Interim medical history since our last visit reviewed. Allergies and medications reviewed and updated.  Review of Systems  Per HPI unless specifically indicated above     Objective:    BP 111/63   Pulse 62   Temp 98 F (36.7 C) (Oral)   Wt 174 lb (78.9 kg)   LMP  (LMP Unknown)   SpO2 96%   BMI 28.96 kg/m   Wt Readings from Last 3 Encounters:  06/21/19 174 lb (78.9 kg)  02/07/19 177 lb 11.2 oz (80.6 kg)  10/29/18 180 lb (81.6 kg)    Physical Exam Vitals signs and nursing note reviewed.  Constitutional:      Appearance: Normal appearance. She is not ill-appearing.  HENT:     Head: Atraumatic.  Eyes:     Extraocular Movements: Extraocular movements intact.     Conjunctiva/sclera: Conjunctivae normal.  Neck:     Musculoskeletal: Normal range of motion and neck supple.  Cardiovascular:     Rate and Rhythm: Normal rate and regular rhythm.     Heart sounds: Normal heart sounds.  Pulmonary:     Effort: Pulmonary effort is normal.     Breath sounds: Normal breath sounds.  Abdominal:     General: Bowel sounds are normal.     Palpations: Abdomen is soft.      Tenderness: There is no abdominal tenderness. There is no right CVA tenderness, left CVA tenderness or guarding.  Musculoskeletal: Normal range of motion.        General: No deformity.     Comments: Mild reproducible pain in area of concern with twisting and lateral bending   Skin:    General: Skin is warm and dry.  Neurological:     Mental Status: She is alert and oriented to person, place, and time.  Psychiatric:        Mood and Affect: Mood normal.        Thought Content: Thought content normal.        Judgment: Judgment normal.     Results for orders placed or performed in visit on 06/21/19  UA/M w/rflx Culture, Routine   Specimen: Urine   URINE  Result Value Ref Range   Specific Gravity, UA 1.010 1.005 - 1.030   pH, UA 7.0 5.0 - 7.5   Color, UA Yellow Yellow   Appearance Ur Clear Clear   Leukocytes,UA Negative Negative   Protein,UA Negative Negative/Trace   Glucose, UA Negative Negative   Ketones, UA Negative  Negative   RBC, UA Negative Negative   Bilirubin, UA Negative Negative   Urobilinogen, Ur 0.2 0.2 - 1.0 mg/dL   Nitrite, UA Negative Negative      Assessment & Plan:   Problem List Items Addressed This Visit    None    Visit Diagnoses    Flank pain    -  Primary   U/A benign, but hydrate well and f/u for recheck U/A if sxs worsening. Suspect more related to muscular strain at this time   Relevant Orders   UA/M w/rflx Culture, Routine (Completed)   Back strain, initial encounter       Given timeline and exam findings as well as neg U/A suspect muscular in nature. Heat, NSAIDs, stretches, massage. F/u if sxs not improving       Follow up plan: Return if symptoms worsen or fail to improve.

## 2019-07-03 ENCOUNTER — Other Ambulatory Visit: Payer: Self-pay

## 2019-07-03 ENCOUNTER — Ambulatory Visit (INDEPENDENT_AMBULATORY_CARE_PROVIDER_SITE_OTHER): Payer: Medicare Other

## 2019-07-03 VITALS — BP 112/66 | HR 65 | Temp 97.8°F | Resp 16 | Ht 62.0 in | Wt 175.2 lb

## 2019-07-03 DIAGNOSIS — Z0000 Encounter for general adult medical examination without abnormal findings: Secondary | ICD-10-CM

## 2019-07-03 NOTE — Progress Notes (Signed)
Subjective:   Cynthia Hurst is a 84 y.o. female who presents for Medicare Annual (Subsequent) preventive examination.  Review of Systems:  Cardiac Risk Factors include: advanced age (>50men, >35 women);hypertension;dyslipidemia     Objective:     Vitals: BP 112/66 (BP Location: Right Arm, Patient Position: Sitting)   Pulse 65   Temp 97.8 F (36.6 C) (Temporal)   Resp 16   Ht 5\' 2"  (1.575 m)   Wt 175 lb 3.2 oz (79.5 kg)   LMP  (LMP Unknown)   BMI 32.04 kg/m   Body mass index is 32.04 kg/m.  Advanced Directives 07/03/2019 02/07/2019 10/29/2018 06/27/2018 04/27/2018 04/11/2018 04/02/2018  Does Patient Have a Medical Advance Directive? No No No No No No No  Would patient like information on creating a medical advance directive? No - Patient declined No - Patient declined - No - Patient declined No - Patient declined No - Patient declined Yes (MAU/Ambulatory/Procedural Areas - Information given)    Tobacco Social History   Tobacco Use  Smoking Status Never Smoker  Smokeless Tobacco Never Used     Counseling given: Not Answered   Clinical Intake:  Pre-visit preparation completed: Yes  Pain : No/denies pain     Nutritional Risks: None Diabetes: No  How often do you need to have someone help you when you read instructions, pamphlets, or other written materials from your doctor or pharmacy?: 1 - Never  Interpreter Needed?: No  Information entered by :: Reshaun Briseno,LPN  Past Medical History:  Diagnosis Date  . Chronic kidney disease   . GERD (gastroesophageal reflux disease)   . Gout   . Hypertension   . Hypopotassemia   . Osteoarthritis   . Osteoporosis    LUMBAR SPINE  . Vitamin B 12 deficiency   . Vitamin D deficiency    Past Surgical History:  Procedure Laterality Date  . ABDOMINAL HYSTERECTOMY     PARTIAL  . EYE SURGERY Left 05/2018   cataract surgery   . JOINT REPLACEMENT Bilateral 2010,2011   KNEE   Family History  Problem Relation Age of  Onset  . Heart disease Mother   . Cancer Father   . Alzheimer's disease Father   . Breast cancer Neg Hx    Social History   Socioeconomic History  . Marital status: Married    Spouse name: Not on file  . Number of children: Not on file  . Years of education: Not on file  . Highest education level: High school graduate  Occupational History  . Not on file  Social Needs  . Financial resource strain: Not hard at all  . Food insecurity    Worry: Never true    Inability: Never true  . Transportation needs    Medical: No    Non-medical: No  Tobacco Use  . Smoking status: Never Smoker  . Smokeless tobacco: Never Used  Substance and Sexual Activity  . Alcohol use: No  . Drug use: No  . Sexual activity: Yes  Lifestyle  . Physical activity    Days per week: 0 days    Minutes per session: 0 min  . Stress: Not at all  Relationships  . Social connections    Talks on phone: More than three times a week    Gets together: More than three times a week    Attends religious service: More than 4 times per year    Active member of club or organization: Yes    Attends  meetings of clubs or organizations: More than 4 times per year    Relationship status: Married  Other Topics Concern  . Not on file  Social History Narrative  . Not on file    Outpatient Encounter Medications as of 07/03/2019  Medication Sig  . allopurinol (ZYLOPRIM) 300 MG tablet TAKE 1 TABLET DAILY  . atenolol (TENORMIN) 100 MG tablet TAKE 1 TABLET DAILY  . cholecalciferol (VITAMIN D) 1000 UNITS tablet Take 1,000 Units by mouth daily.  Marland Kitchen NIFEdipine (ADALAT CC) 60 MG 24 hr tablet Take 1 tablet (60 mg total) by mouth daily.  Marland Kitchen omeprazole (PRILOSEC) 20 MG capsule Take 1 capsule (20 mg total) by mouth daily.  . potassium chloride SA (K-DUR,KLOR-CON) 20 MEQ tablet Take 1 tablet (20 mEq total) by mouth daily.  . raloxifene (EVISTA) 60 MG tablet Take 1 tablet (60 mg total) by mouth daily.  . vitamin B-12 (CYANOCOBALAMIN)  1000 MCG tablet Take 1,000 mcg by mouth daily.   No facility-administered encounter medications on file as of 07/03/2019.     Activities of Daily Living In your present state of health, do you have any difficulty performing the following activities: 07/03/2019  Hearing? N  Vision? N  Comment wears glasses  Difficulty concentrating or making decisions? N  Walking or climbing stairs? N  Dressing or bathing? N  Doing errands, shopping? N  Preparing Food and eating ? N  Using the Toilet? N  In the past six months, have you accidently leaked urine? N  Do you have problems with loss of bowel control? N  Managing your Medications? N  Managing your Finances? N  Housekeeping or managing your Housekeeping? N  Some recent data might be hidden    Patient Care Team: Venita Lick, NP as PCP - General (Nurse Practitioner) Marlowe Sax, MD as Referring Physician (Internal Medicine) Earlie Server, MD as Consulting Physician (Oncology)    Assessment:   This is a routine wellness examination for [name].  Exercise Activities and Dietary recommendations Current Exercise Habits: The patient does not participate in regular exercise at present, Exercise limited by: None identified  Goals    . DIET - INCREASE WATER INTAKE     Recommend drinking at least 6-8 glasses of water a day     . Increase water intake     Recommend drinking at least 4-5 glasses of water a day        Fall Risk: Fall Risk  07/03/2019 06/27/2018 06/21/2017 06/10/2016 06/10/2015  Falls in the past year? 0 No Yes No No  Number falls in past yr: - - 1 - -  Injury with Fall? - - Yes - -    [place]:  Any stairs in or around the home? No  If so, are there any without handrails? No   Home free of loose throw rugs in walkways, pet beds, electrical cords, etc? Yes  Adequate lighting in your home to reduce risk of falls? Yes   ASSISTIVE DEVICES UTILIZED TO PREVENT FALLS:  Life  alert? No  Use of a cane, walker or w/c? No  Grab bars in the bathroom? No  Shower chair or bench in shower? Yes  Elevated toilet seat or a handicapped toilet? No   DME ORDERS:  DME order needed?  No   TIMED UP AND GO:  Was the test performed? Yes .  Length of time to ambulate 10 feet: 11 sec.   GAIT:  Appearance of gait:  Gait steady and fast without the use of an assistive device Education: Fall risk prevention has been discussed.  Intervention(s) required? No   DME/home health order needed?  No    Depression Screen PHQ 2/9 Scores 07/03/2019 06/27/2018 06/21/2017 06/10/2016  PHQ - 2 Score 0 0 0 0     Cognitive Function     6CIT Screen 07/03/2019 06/27/2018 06/21/2017  What Year? 0 points 0 points 0 points  What month? 0 points 0 points 0 points  What time? 0 points 0 points 0 points  Count back from 20 0 points 0 points 0 points  Months in reverse 0 points 0 points 0 points  Repeat phrase 0 points 2 points 0 points  Total Score 0 2 0    Immunization History  Administered Date(s) Administered  . Pneumococcal Polysaccharide-23 03/23/2005  . Td 03/23/2005    Qualifies for Shingles Vaccine? Yes  Zostavax completed unknown. Due for Shingrix. Education has been provided regarding the importance of this vaccine. Pt has been advised to call insurance company to determine out of pocket expense. Advised may also receive vaccine at local pharmacy or Health Dept. Verbalized acceptance and understanding.  Tdap: Although this vaccine is not a covered service during a Wellness Exam, does the patient still wish to receive this vaccine today?  No .  Education has been provided regarding the importance of this vaccine. Advised may receive this vaccine at local pharmacy or Health Dept. Aware to provide a copy of the vaccination record if obtained from local pharmacy or Health Dept. Verbalized acceptance and understanding.  Flu Vaccine: due 08/2019  Pneumococcal Vaccine: declined   Screening Tests Health Maintenance  Topic Date Due  . TETANUS/TDAP  06/10/2020 (Originally 03/24/2015)  . PNA vac Low Risk Adult (2 of 2 - PCV13) 06/20/2020 (Originally 03/23/2006)  . INFLUENZA VACCINE  07/13/2019  . DEXA SCAN  Completed    Cancer Screenings:  Colorectal Screening: no longer requird   Mammogram: no longer required  Bone Density: Completed 05/25/2010  Lung Cancer Screening: (Low Dose CT Chest recommended if Age 48-80 years, 30 pack-year currently smoking OR have quit w/in 15years.) does not qualify.    Additional Screening:  Hepatitis C Screening: does not qualify  Vision Screening: Recommended annual ophthalmology exams for early detection of glaucoma and other disorders of the eye. Is the patient up to date with their annual eye exam?  Yes  Who is the provider or what is the name of the office in which the pt attends annual eye exams? [facility]   Dental Screening: Recommended annual dental exams for proper oral hygiene  Community Resource Referral:  CRR required this visit?  No       Plan:  I have personally reviewed and addressed the Medicare Annual Wellness questionnaire and have noted the following in the patient's chart:  A. Medical and social history B. Use of alcohol, tobacco or illicit drugs  C. Current medications and supplements D. Functional ability and status E.  Nutritional status F.  Physical activity G. Advance directives H. List of other physicians I.  Hospitalizations, surgeries, and ER visits in previous 12 months J.  [place] such as hearing and vision if needed, cognitive and depression L. Referrals and appointments   In addition, I have reviewed and discussed with patient certain preventive protocols, quality metrics, and best practice recommendations. A written personalized care plan for preventive services as well as general preventive health recommendations were provided to patient.  Signed,  Bevelyn Ngo, [place]   07/02/8287 Nurse Health Advisor   Nurse Notes: none

## 2019-07-03 NOTE — Patient Instructions (Signed)
Ms. Jon , Thank you for taking time to come for your Medicare Wellness Visit. I appreciate your ongoing commitment to your health goals. Please review the following plan we discussed and let me know if I can assist you in the future.   Screening recommendations/referrals: Colonoscopy: no longer required Mammogram: no longer required Bone Density: completed  Recommended yearly ophthalmology/optometry visit for glaucoma screening and checkup Recommended yearly dental visit for hygiene and checkup  Vaccinations: Influenza vaccine: due 08/2019 Pneumococcal vaccine: declined Tdap vaccine: due, check with your insurance company for coverage  Shingles vaccine: shingrix eligible, check with your insurance company for coverage     Advanced directives: Advance directive discussed with you today. Even though you declined this today please call our office should you change your mind and we can give you the proper paperwork for you to fill out.  Conditions/risks identified: none  Next appointment: Follow up in one year for your annual wellness exam.   Preventive Care 65 Years and Older, Female Preventive care refers to lifestyle choices and visits with your health care provider that can promote health and wellness. What does preventive care include?  A yearly physical exam. This is also called an annual well check.  Dental exams once or twice a year.  Routine eye exams. Ask your health care provider how often you should have your eyes checked.  Personal lifestyle choices, including:  Daily care of your teeth and gums.  Regular physical activity.  Eating a healthy diet.  Avoiding tobacco and drug use.  Limiting alcohol use.  Practicing safe sex.  Taking low-dose aspirin every day.  Taking vitamin and mineral supplements as recommended by your health care provider. What happens during an annual well check? The services and screenings done by your health care provider during your  annual well check will depend on your age, overall health, lifestyle risk factors, and family history of disease. Counseling  Your health care provider may ask you questions about your:  Alcohol use.  Tobacco use.  Drug use.  Emotional well-being.  Home and relationship well-being.  Sexual activity.  Eating habits.  History of falls.  Memory and ability to understand (cognition).  Work and work Statistician.  Reproductive health. Screening  You may have the following tests or measurements:  Height, weight, and BMI.  Blood pressure.  Lipid and cholesterol levels. These may be checked every 5 years, or more frequently if you are over 82 years old.  Skin check.  Lung cancer screening. You may have this screening every year starting at age 82 if you have a 30-pack-year history of smoking and currently smoke or have quit within the past 15 years.  Fecal occult blood test (FOBT) of the stool. You may have this test every year starting at age 82.  Flexible sigmoidoscopy or colonoscopy. You may have a sigmoidoscopy every 5 years or a colonoscopy every 10 years starting at age 82.  Hepatitis C blood test.  Hepatitis B blood test.  Sexually transmitted disease (STD) testing.  Diabetes screening. This is done by checking your blood sugar (glucose) after you have not eaten for a while (fasting). You may have this done every 1-3 years.  Bone density scan. This is done to screen for osteoporosis. You may have this done starting at age 82.  Mammogram. This may be done every 1-2 years. Talk to your health care provider about how often you should have regular mammograms. Talk with your health care provider about your test results,  treatment options, and if necessary, the need for more tests. Vaccines  Your health care provider may recommend certain vaccines, such as:  Influenza vaccine. This is recommended every year.  Tetanus, diphtheria, and acellular pertussis (Tdap, Td)  vaccine. You may need a Td booster every 10 years.  Zoster vaccine. You may need this after age 82.  Pneumococcal 13-valent conjugate (PCV13) vaccine. One dose is recommended after age 82.  Pneumococcal polysaccharide (PPSV23) vaccine. One dose is recommended after age 82. Talk to your health care provider about which screenings and vaccines you need and how often you need them. This information is not intended to replace advice given to you by your health care provider. Make sure you discuss any questions you have with your health care provider. Document Released: 12/25/2015 Document Revised: 08/17/2016 Document Reviewed: 09/29/2015 Elsevier Interactive Patient Education  2017 [employer] Prevention in the Home Falls can cause injuries. They can happen to people of all ages. There are many things you can do to make your home safe and to help prevent falls. What can I do on the outside of my home?  Regularly fix the edges of walkways and driveways and fix any cracks.  Remove anything that might make you trip as you walk through a door, such as a raised step or threshold.  Trim any bushes or trees on the path to your home.  Use bright outdoor lighting.  Clear any walking paths of anything that might make someone trip, such as rocks or tools.  Regularly check to see if handrails are loose or broken. Make sure that both sides of any steps have handrails.  Any raised decks and porches should have guardrails on the edges.  Have any leaves, snow, or ice cleared regularly.  Use sand or salt on walking paths during winter.  Clean up any spills in your garage right away. This includes oil or grease spills. What can I do in the bathroom?  Use night lights.  Install grab bars by the toilet and in the tub and shower. Do not use towel bars as grab bars.  Use non-skid mats or decals in the tub or shower.  If you need to sit down in the shower, use a plastic, non-slip stool.   Keep the floor dry. Clean up any water that spills on the floor as soon as it happens.  Remove soap buildup in the tub or shower regularly.  Attach bath mats securely with double-sided non-slip rug tape.  Do not have throw rugs and other things on the floor that can make you trip. What can I do in the bedroom?  Use night lights.  Make sure that you have a light by your bed that is easy to reach.  Do not use any sheets or blankets that are too big for your bed. They should not hang down onto the floor.  Have a firm chair that has side arms. You can use this for support while you get dressed.  Do not have throw rugs and other things on the floor that can make you trip. What can I do in the kitchen?  Clean up any spills right away.  Avoid walking on wet floors.  Keep items that you use a lot in easy-to-reach places.  If you need to reach something above you, use a strong step stool that has a grab bar.  Keep electrical cords out of the way.  Do not use floor polish or wax that makes floors  slippery. If you must use wax, use non-skid floor wax.  Do not have throw rugs and other things on the floor that can make you trip. What can I do with my stairs?  Do not leave any items on the stairs.  Make sure that there are handrails on both sides of the stairs and use them. Fix handrails that are broken or loose. Make sure that handrails are as long as the stairways.  Check any carpeting to make sure that it is firmly attached to the stairs. Fix any carpet that is loose or worn.  Avoid having throw rugs at the top or bottom of the stairs. If you do have throw rugs, attach them to the floor with carpet tape.  Make sure that you have a light switch at the top of the stairs and the bottom of the stairs. If you do not have them, ask someone to add them for you. What else can I do to help prevent falls?  Wear shoes that:  Do not have high heels.  Have rubber bottoms.  Are  comfortable and fit you well.  Are closed at the toe. Do not wear sandals.  If you use a stepladder:  Make sure that it is fully opened. Do not climb a closed stepladder.  Make sure that both sides of the stepladder are locked into place.  Ask someone to hold it for you, if possible.  Clearly mark and make sure that you can see:  Any grab bars or handrails.  First and last steps.  Where the edge of each step is.  Use tools that help you move around (mobility aids) if they are needed. These include:  Canes.  Walkers.  Scooters.  Crutches.  Turn on the lights when you go into a dark area. Replace any light bulbs as soon as they burn out.  Set up your furniture so you have a clear path. Avoid moving your furniture around.  If any of your floors are uneven, fix them.  If there are any pets around you, be aware of where they are.  Review your medicines with your doctor. Some medicines can make you feel dizzy. This can increase your chance of falling. Ask your doctor what other things that you can do to help prevent falls. This information is not intended to replace advice given to you by your health care provider. Make sure you discuss any questions you have with your health care provider. Document Released: 09/24/2009 Document Revised: 05/05/2016 Document Reviewed: 01/02/2015 Elsevier Interactive Patient Education  2017 [employer].

## 2019-07-05 ENCOUNTER — Encounter: Payer: Self-pay | Admitting: Nurse Practitioner

## 2019-07-05 ENCOUNTER — Other Ambulatory Visit: Payer: Self-pay

## 2019-07-05 ENCOUNTER — Other Ambulatory Visit: Payer: Medicare Other

## 2019-07-05 ENCOUNTER — Ambulatory Visit (INDEPENDENT_AMBULATORY_CARE_PROVIDER_SITE_OTHER): Payer: Medicare Other | Admitting: Nurse Practitioner

## 2019-07-05 VITALS — Ht 62.0 in | Wt 175.0 lb

## 2019-07-05 DIAGNOSIS — R309 Painful micturition, unspecified: Secondary | ICD-10-CM

## 2019-07-05 DIAGNOSIS — R109 Unspecified abdominal pain: Secondary | ICD-10-CM

## 2019-07-05 DIAGNOSIS — R351 Nocturia: Secondary | ICD-10-CM

## 2019-07-05 LAB — UA/M W/RFLX CULTURE, ROUTINE
Bilirubin, UA: NEGATIVE
Glucose, UA: NEGATIVE
Ketones, UA: NEGATIVE
Leukocytes,UA: NEGATIVE
Nitrite, UA: NEGATIVE
Protein,UA: NEGATIVE
RBC, UA: NEGATIVE
Specific Gravity, UA: <1.005 — ABNORMAL LOW (ref 1.005–1.030)
Urobilinogen, Ur: 0.2 mg/dL (ref 0.2–1.0)
pH, UA: 7 (ref 5.0–7.5)

## 2019-07-05 NOTE — Addendum Note (Signed)
Addended by: Marnee Guarneri T on: 07/05/2019 05:23 PM   Modules accepted: Level of Service

## 2019-07-05 NOTE — Patient Instructions (Signed)
Flank Pain, Adult Flank pain is pain in your side. The flank is the area of your side between your upper belly (abdomen) and your back. The pain may occur over a short time (acute), or it may be long-term or come back often (chronic). It may be mild or very bad. Pain in this area can be caused by many different things. Follow these instructions at home:   Drink enough fluid to keep your pee (urine) clear or pale yellow.  Rest as told by your doctor.  Take over-the-counter and prescription medicines only as told by your doctor.  Keep a journal to keep track of: ? What has caused your flank pain. ? What has made it feel better.  Keep all follow-up visits as told by your doctor. This is important. Contact a doctor if:  Medicine does not help your pain.  You have new symptoms.  Your pain gets worse.  You have a fever.  Your symptoms last longer than 2-3 days.  You have trouble peeing.  You are peeing more often than normal. Get help right away if:  You have trouble breathing.  You are short of breath.  Your belly hurts, or it is swollen or red.  You feel sick to your stomach (nauseous).  You throw up (vomit).  You feel like you will pass out, or you do pass out (faint).  You have blood in your pee. Summary  Flank pain is pain in your side. The flank is the area of your side between your upper belly (abdomen) and your back.  Flank pain may occur over a short time (acute), or it may be long-term or come back often (chronic). It may be mild or very bad.  Pain in this area can be caused by many different things.  Contact your doctor if your symptoms get worse or they last longer than 2-3 days. This information is not intended to replace advice given to you by your health care provider. Make sure you discuss any questions you have with your health care provider. Document Released: 09/06/2008 Document Revised: 11/10/2017 Document Reviewed: 03/20/2017 Elsevier Patient  Education  2020 [employer]  

## 2019-07-05 NOTE — Assessment & Plan Note (Signed)
UA with benign findings, SG on lower end. Suspect more related to muscular strain at this time.  Continue Aspercreme, Tylenol, and heating pad as needed.  Recommend Voltaren gel.  Will have come in office next week for face to face visit and blood work.

## 2019-07-05 NOTE — Assessment & Plan Note (Signed)
Suspect related to her increasing fluid intake later in evening over past couple months.  Have recommended reducing fluid intake after 6 pm.  Taking in adequate fluid through day time hours to help avoid frequent urination at night.  UA benign on exam today.  Will obtain blood work at next visit.

## 2019-07-05 NOTE — Progress Notes (Signed)
Ht 5\' 2"  (1.575 m)   Wt 175 lb (79.4 kg)   LMP  (LMP Unknown)   BMI 32.01 kg/m    Subjective:    Patient ID: Cynthia Hurst, female    DOB: [birth year], 84 y.o.   MRN: [id number]  HPI: Cynthia Hurst is a 84 y.o. female  Chief Complaint  Patient presents with  . Flank Pain    Patient states her symptoms are worse at night. Ongoing for months.  . Urinary Urgency  . Nocturia    . This visit was completed via telephone due to the restrictions of the COVID-19 pandemic. All issues as above were discussed and addressed but no physical exam was performed. If it was felt that the patient should be evaluated in the office, they were directed there. The patient verbally consented to this visit. Patient was unable to complete an audio/visual visit due to Lack of equipment. Due to the catastrophic nature of the COVID-19 pandemic, this visit was done through audio contact only. . Location of the patient: home . Location of the provider: home . Those involved with this call:  . Provider: Marnee Guarneri, DNP . CMA: Merilyn Baba, CMA . Front Desk/Registration: Jill Side  . Time spent on call: 15 minutes on the phone discussing health concerns. 10 minutes total spent in review of patient's record and preparation of their chart.  . I verified patient identity using two factors (patient name and date of birth). Patient consents verbally to being seen via telemedicine visit today.    LEFT SIDE PAIN WITH NOCTURIA/URGENCY Has had ongoing left flank pain for a month, was last seen 06/21/2019 in office for this.  States the pain is "right where the side starts, under bra strap area on left side down towards hip".  States when she turns over in bed it at times is waking her up.  States this presented at same time as the increase urgency at night and the nocturia, but she also started drinking more water close to bedtime at that time. Has been applying Aspercreme, this helps to make it feel better for  awhile and then it returns.  She does endorses increasing her fluid intake over past several months.  Enjoys water and has been drinking a bottle before bed and then wakes up in middle of night and drinks another bottle of water.  Heating pad and tylenol help the discomfort.  She denies any rashes to left side. Dysuria: no Urinary frequency: no Urgency: yes Small volume voids: no Urinary incontinence: no Foul odor: no Hematuria: no Abdominal pain: no Back pain: no Suprapubic pain/pressure: no Flank pain: yes Fever:  no Vomiting: no Relief with cranberry juice: no Relief with pyridium: no Status: better/worse/stable Previous urinary tract infection: no Recurrent urinary tract infection: no Sexual activity: No sexually active/monogomous History of sexually transmitted disease: no Treatments attempted: increasing fluids   Relevant past medical, surgical, family and social history reviewed and updated as indicated. Interim medical history since our last visit reviewed. Allergies and medications reviewed and updated.  Review of Systems  Constitutional: Negative for activity change, appetite change, diaphoresis, fatigue and fever.  Respiratory: Negative for cough, chest tightness and shortness of breath.   Cardiovascular: Negative for chest pain, palpitations and leg swelling.  Gastrointestinal: Negative for abdominal distention, abdominal pain, constipation, diarrhea, nausea and vomiting.  Genitourinary: Positive for flank pain and urgency. Negative for decreased urine volume, difficulty urinating, dysuria, enuresis, frequency, hematuria, pelvic pain and vaginal discharge.  Neurological: Negative for dizziness, syncope, weakness, light-headedness, numbness and headaches.  Psychiatric/Behavioral: Negative.     Per HPI unless specifically indicated above     Objective:    Ht 5\' 2"  (1.575 m)   Wt 175 lb (79.4 kg)   LMP  (LMP Unknown)   BMI 32.01 kg/m   Wt Readings from Last 3  Encounters:  07/05/19 175 lb (79.4 kg)  07/03/19 175 lb 3.2 oz (79.5 kg)  06/21/19 174 lb (78.9 kg)    Physical Exam   Unable to perform due to telephone visit only.  Results for orders placed or performed in visit on 07/05/19  UA/M w/rflx Culture, Routine   Specimen: Urine   URINE  Result Value Ref Range   Specific Gravity, UA <1.005 (L) 1.005 - 1.030   pH, UA 7.0 5.0 - 7.5   Color, UA Yellow Yellow   Appearance Ur Clear Clear   Leukocytes,UA Negative Negative   Protein,UA Negative Negative/Trace   Glucose, UA Negative Negative   Ketones, UA Negative Negative   RBC, UA Negative Negative   Bilirubin, UA Negative Negative   Urobilinogen, Ur 0.2 0.2 - 1.0 mg/dL   Nitrite, UA Negative Negative      Assessment & Plan:   Problem List Items Addressed This Visit      Other   Flank pain - Primary    UA with benign findings, SG on lower end. Suspect more related to muscular strain at this time.  Continue Aspercreme, Tylenol, and heating pad as needed.  Recommend Voltaren gel.  Will have come in office next week for face to face visit and blood work.        Nocturia    Suspect related to her increasing fluid intake later in evening over past couple months.  Have recommended reducing fluid intake after 6 pm.  Taking in adequate fluid through day time hours to help avoid frequent urination at night.  UA benign on exam today.  Will obtain blood work at next visit.         I discussed the assessment and treatment plan with the patient. The patient was provided an opportunity to ask questions and all were answered. The patient agreed with the plan and demonstrated an understanding of the instructions.   The patient was advised to call back or seek an in-person evaluation if the symptoms worsen or if the condition fails to improve as anticipated.   I provided 15 minutes of time during this encounter.  Follow up plan: Return in about 1 week (around 07/12/2019) for flank pain in  office please.

## 2019-07-09 ENCOUNTER — Ambulatory Visit
Admission: RE | Admit: 2019-07-09 | Discharge: 2019-07-09 | Disposition: A | Discharge status: Home / Self Care | Payer: Medicare Other | Attending: Nurse Practitioner | Admitting: Nurse Practitioner

## 2019-07-09 ENCOUNTER — Encounter: Payer: Self-pay | Admitting: Nurse Practitioner

## 2019-07-09 ENCOUNTER — Ambulatory Visit
Admission: RE | Admit: 2019-07-09 | Discharge: 2019-07-09 | Disposition: A | Discharge status: Home / Self Care | Payer: Medicare Other | Source: Ambulatory Visit | Attending: Nurse Practitioner | Admitting: Nurse Practitioner

## 2019-07-09 ENCOUNTER — Other Ambulatory Visit: Payer: Self-pay

## 2019-07-09 ENCOUNTER — Ambulatory Visit (INDEPENDENT_AMBULATORY_CARE_PROVIDER_SITE_OTHER): Payer: Medicare Other | Admitting: Nurse Practitioner

## 2019-07-09 ENCOUNTER — Other Ambulatory Visit: Payer: Self-pay | Admitting: Nurse Practitioner

## 2019-07-09 VITALS — BP 120/70 | HR 61 | Temp 97.9°F

## 2019-07-09 DIAGNOSIS — R109 Unspecified abdominal pain: Secondary | ICD-10-CM | POA: Diagnosis not present

## 2019-07-09 DIAGNOSIS — M545 Low back pain, unspecified: Secondary | ICD-10-CM

## 2019-07-09 DIAGNOSIS — R351 Nocturia: Secondary | ICD-10-CM | POA: Diagnosis not present

## 2019-07-09 DIAGNOSIS — M4184 Other forms of scoliosis, thoracic region: Secondary | ICD-10-CM | POA: Diagnosis not present

## 2019-07-09 DIAGNOSIS — M546 Pain in thoracic spine: Secondary | ICD-10-CM | POA: Insufficient documentation

## 2019-07-09 DIAGNOSIS — M47816 Spondylosis without myelopathy or radiculopathy, lumbar region: Secondary | ICD-10-CM | POA: Diagnosis not present

## 2019-07-09 LAB — UA/M W/RFLX CULTURE, ROUTINE
Bilirubin, UA: NEGATIVE
Glucose, UA: NEGATIVE
Ketones, UA: NEGATIVE
Leukocytes,UA: NEGATIVE
Nitrite, UA: NEGATIVE
Protein,UA: NEGATIVE
RBC, UA: NEGATIVE
Specific Gravity, UA: 1.01 (ref 1.005–1.030)
Urobilinogen, Ur: 0.2 mg/dL (ref 0.2–1.0)
pH, UA: 5 (ref 5.0–7.5)

## 2019-07-09 NOTE — Progress Notes (Addendum)
BP 120/70   Pulse 61   Temp 97.9 F (36.6 C) (Oral)   LMP  (LMP Unknown)   SpO2 98%    Subjective:    Patient ID: Cynthia Hurst, female    DOB: June 15, 1935, 84 y.o.   MRN: [id number]  HPI: Cynthia Hurst is a 84 y.o. female  Chief Complaint  Patient presents with  . Flank Pain    pt states she has been hurting in her left side for a while, denies urinary symptoms. Describes the pain as a stabbing pain that comes and goes    LEFT SIDE PAIN WITH NOCTURIA/URGENCY Has had ongoing left side pain for a month, was last seen 06/21/2019 and 07/05/2019 in office for this. States the pain is left back to lateral aspect, just under bra line. States when she turns over in bed it at times is waking her up on occasion. This presented around same time as the increase urgency at night and the nocturia. Has been applying Aspercreme, this helps to make it feel better for awhile and then it returns.  She does endorses increasing her fluid intake over past several months, decreased this after discussion with provider on 7/24 and reports these symptoms have improved some. Enjoys water and had been drinking a bottle before bed and then wakes up in middle of night and drinks another bottle of water.  Heating pad and tylenol help the discomfort for short period and recently started using Voltaren gel which helps.    On review is followed by Dr. Tasia Catchings for MGUS.  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  She is also followed by rheumatology for PMR. Duration:weeks Onset: gradual Severity: 10/10 at worst Quality: dull and aching Location:  left mid lateral back Frequency: 2-3 times a night, occasionally during day time, but less during day time hours when up and moving Alleviating factors: Tylenol, heating pad, Aspercreme Aggravating factors: nothing she can think of Status: stable Fever: no Nausea: no Vomiting: no Weight loss: no Decreased  appetite: no Diarrhea: no Constipation: no Blood in stool: no Heartburn: no Jaundice: no Rash: no Dysuria/urinary frequency: frequency at times Hematuria: no History of sexually transmitted disease: no Recurrent NSAID use: no  Relevant past medical, surgical, family and social history reviewed and updated as indicated. Interim medical history since our last visit reviewed. Allergies and medications reviewed and updated.  Review of Systems  Constitutional: Negative for activity change, appetite change, diaphoresis, fatigue and fever.  Respiratory: Negative for cough, chest tightness and shortness of breath.   Cardiovascular: Negative for chest pain, palpitations and leg swelling.  Gastrointestinal: Negative for abdominal distention, abdominal pain, constipation, diarrhea, nausea and vomiting.  Genitourinary: Positive for urgency. Negative for decreased urine volume, dysuria, flank pain, hematuria and vaginal discharge.  Musculoskeletal: Positive for back pain.  Neurological: Negative for dizziness, syncope, weakness, light-headedness, numbness and headaches.  Psychiatric/Behavioral: Negative.     Per HPI unless specifically indicated above     Objective:    BP 120/70   Pulse 61   Temp 97.9 F (36.6 C) (Oral)   LMP  (LMP Unknown)   SpO2 98%   Wt Readings from Last 3 Encounters:  07/05/19 175 lb (79.4 kg)  07/03/19 175 lb 3.2 oz (79.5 kg)  06/21/19 174 lb (78.9 kg)    Physical Exam Vitals signs and nursing note reviewed.  Constitutional:      General: She is awake. She is  not in acute distress.    Appearance: She is well-developed. She is obese. She is not ill-appearing.  HENT:     Head: Normocephalic.     Right Ear: Hearing normal.     Left Ear: Hearing normal.     Nose: Nose normal.     Mouth/Throat:     Mouth: Mucous membranes are moist.  Eyes:     General: Lids are normal.        Right eye: No discharge.        Left eye: No discharge.     Conjunctiva/sclera:  Conjunctivae normal.     Pupils: Pupils are equal, round, and reactive to light.  Neck:     Musculoskeletal: Normal range of motion and neck supple.  Cardiovascular:     Rate and Rhythm: Normal rate and regular rhythm.     Heart sounds: Normal heart sounds. No murmur. No gallop.   Pulmonary:     Effort: Pulmonary effort is normal. No accessory muscle usage or respiratory distress.     Breath sounds: Normal breath sounds.  Abdominal:     General: Bowel sounds are normal.     Palpations: Abdomen is soft. There is no hepatomegaly or splenomegaly.  Musculoskeletal:     Thoracic back: She exhibits tenderness. She exhibits normal range of motion, no bony tenderness, no swelling, no edema, no laceration, no pain and no spasm.     Lumbar back: She exhibits normal range of motion, no tenderness, no bony tenderness, no swelling, no edema, no laceration, no pain and no spasm.       Back:     Right lower leg: No edema.     Left lower leg: No edema.  Skin:    General: Skin is warm and dry.  Neurological:     Mental Status: She is alert and oriented to person, place, and time.  Psychiatric:        Attention and Perception: Attention normal.        Mood and Affect: Mood normal.        Behavior: Behavior normal. Behavior is cooperative.        Thought Content: Thought content normal.        Judgment: Judgment normal.     Results for orders placed or performed in visit on 07/09/19  UA/M w/rflx Culture, Routine   Specimen: Urine   URINE  Result Value Ref Range   Specific Gravity, UA 1.010 1.005 - 1.030   pH, UA 5.0 5.0 - 7.5   Color, UA Yellow Yellow   Appearance Ur Clear Clear   Leukocytes,UA Negative Negative   Protein,UA Negative Negative/Trace   Glucose, UA Negative Negative   Ketones, UA Negative Negative   RBC, UA Negative Negative   Bilirubin, UA Negative Negative   Urobilinogen, Ur 0.2 0.2 - 1.0 mg/dL   Nitrite, UA Negative Negative      Assessment & Plan:   Problem List  Items Addressed This Visit      Other   Nocturia    UA remains negative.  Will obtain CMP today.  Has improved with decrease fluid intake prior to bed and in middle of night. Continue to monitor and consider addition of Kegel exercises if ongoing issues.      Acute left-sided thoracic back pain - Primary    Ongoing, UA negative.  Significant scoliosis present to spine.  Will obtain repeat imaging back to assess area.  Continue Aspercreme, Tylenol, Voltaren gel, ice/heat as needed.  Referral to PT due to pain in patient with significant scoliosis back.  Suspect discomfort is musculoskeletal in nature.  Return in 4 weeks.      Relevant Orders   UA/M w/rflx Culture, Routine (Completed)   DG Thoracic Spine W/Swimmers   Comprehensive metabolic panel   Ambulatory referral to Physical Therapy       Follow up plan: Return in about 4 weeks (around 08/06/2019) for left side pain and nocturia.

## 2019-07-09 NOTE — Patient Instructions (Signed)
[place], [place], [place] [address] [phone]  Acute Back Pain, Adult Acute back pain is sudden and usually short-lived. It is often caused by an injury to the muscles and tissues in the back. The injury may result from:  A muscle or ligament getting overstretched or torn (strained). Ligaments are tissues that connect bones to each other. Lifting something improperly can cause a back strain.  Wear and tear (degeneration) of the spinal disks. Spinal disks are circular tissue that provides cushioning between the bones of the spine (vertebrae).  Twisting motions, such as while playing sports or doing yard work.  A hit to the back.  Arthritis. You may have a physical exam, lab tests, and imaging tests to find the cause of your pain. Acute back pain usually goes away with rest and home care. Follow these instructions at home: Managing pain, stiffness, and swelling  Take over-the-counter and prescription medicines only as told by your health care provider.  Your health care provider may recommend applying ice during the first 24-48 hours after your pain starts. To do this: ? Put ice in a plastic bag. ? Place a towel between your skin and the bag. ? Leave the ice on for 20 minutes, 2-3 times a day.  If directed, apply heat to the affected area as often as told by your health care provider. Use the heat source that your health care provider recommends, such as a moist heat pack or a heating pad. ? Place a towel between your skin and the heat source. ? Leave the heat on for 20-30 minutes. ? Remove the heat if your skin turns bright red. This is especially important if you are unable to feel pain, heat, or cold. You have a greater risk of getting burned. Activity   Do not stay in bed. Staying in bed for more than 1-2 days can delay your recovery.  Sit up and stand up straight. Avoid leaning forward when you sit, or hunching over when you stand. ? If you work at a desk,  sit close to it so you do not need to lean over. Keep your chin tucked in. Keep your neck drawn back, and keep your elbows bent at a right angle. Your arms should look like the letter "L." ? Sit high and close to the steering wheel when you drive. Add lower back (lumbar) support to your car seat, if needed.  Take short walks on even surfaces as soon as you are able. Try to increase the length of time you walk each day.  Do not sit, drive, or stand in one place for more than 30 minutes at a time. Sitting or standing for long periods of time can put stress on your back.  Do not drive or use heavy machinery while taking prescription pain medicine.  Use proper lifting techniques. When you bend and lift, use positions that put less stress on your back: ? [place] your knees. ? Keep the load close to your body. ? Avoid twisting.  Exercise regularly as told by your health care provider. Exercising helps your back heal faster and helps prevent back injuries by keeping muscles strong and flexible.  Work with a physical therapist to make a safe exercise program, as recommended by your health care provider. Do any exercises as told by your physical therapist. Lifestyle  Maintain a healthy weight. Extra weight puts stress on your back and makes it difficult to have good posture.  Avoid activities or situations that  make you feel anxious or stressed. Stress and anxiety increase muscle tension and can make back pain worse. Learn ways to manage anxiety and stress, such as through exercise. General instructions  Sleep on a firm mattress in a comfortable position. Try lying on your side with your knees slightly bent. If you lie on your back, put a pillow under your knees.  Follow your treatment plan as told by your health care provider. This may include: ? Cognitive or behavioral therapy. ? Acupuncture or massage therapy. ? Meditation or yoga. Contact a health care provider if:  You have pain that is not  relieved with rest or medicine.  You have increasing pain going down into your legs or buttocks.  Your pain does not improve after 2 weeks.  You have pain at night.  You lose weight without trying.  You have a fever or chills. Get help right away if:  You develop new bowel or bladder control problems.  You have unusual weakness or numbness in your arms or legs.  You develop nausea or vomiting.  You develop abdominal pain.  You feel faint. Summary  Acute back pain is sudden and usually short-lived.  Use proper lifting techniques. When you bend and lift, use positions that put less stress on your back.  Take over-the-counter and prescription medicines and apply heat or ice as directed by your health care provider. This information is not intended to replace advice given to you by your health care provider. Make sure you discuss any questions you have with your health care provider. Document Released: 11/28/2005 Document Revised: 03/19/2019 Document Reviewed: 07/12/2017 Elsevier Patient Education  2020 [employer].

## 2019-07-09 NOTE — Assessment & Plan Note (Signed)
UA remains negative.  Will obtain CMP today.  Has improved with decrease fluid intake prior to bed and in middle of night. Continue to monitor and consider addition of Kegel exercises if ongoing issues.

## 2019-07-09 NOTE — Assessment & Plan Note (Signed)
Ongoing, UA negative.  Significant scoliosis present to spine.  Will obtain repeat imaging back to assess area.  Continue Aspercreme, Tylenol, Voltaren gel, ice/heat as needed.  Referral to PT due to pain in patient with significant scoliosis back.  Suspect discomfort is musculoskeletal in nature.  Return in 4 weeks.

## 2019-07-10 ENCOUNTER — Telehealth: Payer: Self-pay | Admitting: Nurse Practitioner

## 2019-07-10 LAB — COMPREHENSIVE METABOLIC PANEL
ALT: 8 IU/L (ref 0–32)
AST: 16 IU/L (ref 0–40)
Albumin/Globulin Ratio: 1.6 (ref 1.2–2.2)
Albumin: 4 g/dL (ref 3.6–4.6)
Alkaline Phosphatase: 109 IU/L (ref 39–117)
BUN/Creatinine Ratio: 11 — ABNORMAL LOW (ref 12–28)
BUN: 11 mg/dL (ref 8–27)
Bilirubin Total: 0.6 mg/dL (ref 0.0–1.2)
CO2: 24 mmol/L (ref 20–29)
Calcium: 9.6 mg/dL (ref 8.7–10.3)
Chloride: 102 mmol/L (ref 96–106)
Creatinine, Ser: 1.02 mg/dL — ABNORMAL HIGH (ref 0.57–1.00)
GFR calc Af Amer: 59 mL/min/{1.73_m2} — ABNORMAL LOW (ref >59)
GFR calc non Af Amer: 51 mL/min/{1.73_m2} — ABNORMAL LOW (ref >59)
Globulin, Total: 2.5 g/dL (ref 1.5–4.5)
Glucose: 106 mg/dL — ABNORMAL HIGH (ref 65–99)
Potassium: 4 mmol/L (ref 3.5–5.2)
Sodium: 140 mmol/L (ref 134–144)
Total Protein: 6.5 g/dL (ref 6.0–8.5)

## 2019-07-10 NOTE — Telephone Encounter (Signed)
Discussed recent lab results with patient via telephone.  Discussed imaging noting continued degenerative changes with her scoliosis to her midthoracic and lumbar spine.  She has referral for PT and will start this once schedule set + work on stretches at home.  She will continue to use simple treatment at home.  Discussed that there is possibility of gallstone to right upper quadrant.  She denies any right back discomfort, abdominal pain, N&V, SOB, CP.  Discussed with her and we will continue to monitor this, if other symptoms present will refer to GI and obtain further imaging.  Reviewed her CMP results with her.  She agreed with current plan of care and will notify provider if worsening discomfort or concerns.

## 2019-07-23 DIAGNOSIS — M546 Pain in thoracic spine: Secondary | ICD-10-CM | POA: Diagnosis not present

## 2019-07-23 DIAGNOSIS — M25612 Stiffness of left shoulder, not elsewhere classified: Secondary | ICD-10-CM | POA: Diagnosis not present

## 2019-07-23 DIAGNOSIS — M25512 Pain in left shoulder: Secondary | ICD-10-CM | POA: Diagnosis not present

## 2019-07-23 DIAGNOSIS — M6281 Muscle weakness (generalized): Secondary | ICD-10-CM | POA: Diagnosis not present

## 2019-07-25 DIAGNOSIS — M25512 Pain in left shoulder: Secondary | ICD-10-CM | POA: Diagnosis not present

## 2019-07-25 DIAGNOSIS — M546 Pain in thoracic spine: Secondary | ICD-10-CM | POA: Diagnosis not present

## 2019-07-25 DIAGNOSIS — M25612 Stiffness of left shoulder, not elsewhere classified: Secondary | ICD-10-CM | POA: Diagnosis not present

## 2019-07-25 DIAGNOSIS — M6281 Muscle weakness (generalized): Secondary | ICD-10-CM | POA: Diagnosis not present

## 2019-07-28 ENCOUNTER — Other Ambulatory Visit: Payer: Self-pay | Admitting: Nurse Practitioner

## 2019-07-29 DIAGNOSIS — M25512 Pain in left shoulder: Secondary | ICD-10-CM | POA: Diagnosis not present

## 2019-07-29 DIAGNOSIS — M25612 Stiffness of left shoulder, not elsewhere classified: Secondary | ICD-10-CM | POA: Diagnosis not present

## 2019-07-29 DIAGNOSIS — M546 Pain in thoracic spine: Secondary | ICD-10-CM | POA: Diagnosis not present

## 2019-07-29 DIAGNOSIS — M6281 Muscle weakness (generalized): Secondary | ICD-10-CM | POA: Diagnosis not present

## 2019-07-31 DIAGNOSIS — M546 Pain in thoracic spine: Secondary | ICD-10-CM | POA: Diagnosis not present

## 2019-07-31 DIAGNOSIS — M6281 Muscle weakness (generalized): Secondary | ICD-10-CM | POA: Diagnosis not present

## 2019-07-31 DIAGNOSIS — M25612 Stiffness of left shoulder, not elsewhere classified: Secondary | ICD-10-CM | POA: Diagnosis not present

## 2019-07-31 DIAGNOSIS — M25512 Pain in left shoulder: Secondary | ICD-10-CM | POA: Diagnosis not present

## 2019-08-01 ENCOUNTER — Inpatient Hospital Stay: Payer: Medicare Other | Attending: Oncology

## 2019-08-01 ENCOUNTER — Other Ambulatory Visit: Payer: Self-pay

## 2019-08-01 DIAGNOSIS — N183 Chronic kidney disease, stage 3 (moderate): Secondary | ICD-10-CM | POA: Diagnosis not present

## 2019-08-01 DIAGNOSIS — I129 Hypertensive chronic kidney disease with stage 1 through stage 4 chronic kidney disease, or unspecified chronic kidney disease: Secondary | ICD-10-CM | POA: Insufficient documentation

## 2019-08-01 DIAGNOSIS — Z79899 Other long term (current) drug therapy: Secondary | ICD-10-CM | POA: Insufficient documentation

## 2019-08-01 DIAGNOSIS — D472 Monoclonal gammopathy: Secondary | ICD-10-CM | POA: Insufficient documentation

## 2019-08-01 LAB — CBC WITH DIFFERENTIAL/PLATELET
Abs Immature Granulocytes: 0.01 10*3/uL (ref 0.00–0.07)
Basophils Absolute: 0 10*3/uL (ref 0.0–0.1)
Basophils Relative: 1 %
Eosinophils Absolute: 0.1 10*3/uL (ref 0.0–0.5)
Eosinophils Relative: 2 %
HCT: 38.2 % (ref 36.0–46.0)
Hemoglobin: 12.5 g/dL (ref 12.0–15.0)
Immature Granulocytes: 0 %
Lymphocytes Relative: 30 %
Lymphs Abs: 1.9 10*3/uL (ref 0.7–4.0)
MCH: 30.1 pg (ref 26.0–34.0)
MCHC: 32.7 g/dL (ref 30.0–36.0)
MCV: 92 fL (ref 80.0–100.0)
Monocytes Absolute: 0.6 10*3/uL (ref 0.1–1.0)
Monocytes Relative: 10 %
Neutro Abs: 3.6 10*3/uL (ref 1.7–7.7)
Neutrophils Relative %: 57 %
Platelets: 166 10*3/uL (ref 150–400)
RBC: 4.15 MIL/uL (ref 3.87–5.11)
RDW: 14.6 % (ref 11.5–15.5)
WBC: 6.2 10*3/uL (ref 4.0–10.5)
nRBC: 0 % (ref 0.0–0.2)

## 2019-08-01 LAB — COMPREHENSIVE METABOLIC PANEL
ALT: 10 U/L (ref 0–44)
AST: 22 U/L (ref 15–41)
Albumin: 3.8 g/dL (ref 3.5–5.0)
Alkaline Phosphatase: 95 U/L (ref 38–126)
Anion gap: 8 (ref 5–15)
BUN: 13 mg/dL (ref 8–23)
CO2: 26 mmol/L (ref 22–32)
Calcium: 9.7 mg/dL (ref 8.9–10.3)
Chloride: 107 mmol/L (ref 98–111)
Creatinine, Ser: 1.24 mg/dL — ABNORMAL HIGH (ref 0.44–1.00)
GFR calc Af Amer: 47 mL/min — ABNORMAL LOW (ref >60)
GFR calc non Af Amer: 40 mL/min — ABNORMAL LOW (ref >60)
Glucose, Bld: 125 mg/dL — ABNORMAL HIGH (ref 70–99)
Potassium: 3.8 mmol/L (ref 3.5–5.1)
Sodium: 141 mmol/L (ref 135–145)
Total Bilirubin: 1 mg/dL (ref 0.3–1.2)
Total Protein: 7.4 g/dL (ref 6.5–8.1)

## 2019-08-02 LAB — MULTIPLE MYELOMA PANEL, SERUM
Albumin SerPl Elph-Mcnc: 3.6 g/dL (ref 2.9–4.4)
Albumin/Glob SerPl: 1.3 (ref 0.7–1.7)
Alpha 1: 0.3 g/dL (ref 0.0–0.4)
Alpha2 Glob SerPl Elph-Mcnc: 0.5 g/dL (ref 0.4–1.0)
B-Globulin SerPl Elph-Mcnc: 0.9 g/dL (ref 0.7–1.3)
Gamma Glob SerPl Elph-Mcnc: 1.2 g/dL (ref 0.4–1.8)
Globulin, Total: 3 g/dL (ref 2.2–3.9)
IgA: 190 mg/dL (ref 64–422)
IgG (Immunoglobin G), Serum: 1196 mg/dL (ref 586–1602)
IgM (Immunoglobulin M), Srm: 232 mg/dL — ABNORMAL HIGH (ref 26–217)
Total Protein ELP: 6.6 g/dL (ref 6.0–8.5)

## 2019-08-02 LAB — KAPPA/LAMBDA LIGHT CHAINS
Kappa free light chain: 36.9 mg/L — ABNORMAL HIGH (ref 3.3–19.4)
Kappa, lambda light chain ratio: 1.42 (ref 0.26–1.65)
Lambda free light chains: 25.9 mg/L (ref 5.7–26.3)

## 2019-08-05 DIAGNOSIS — M6281 Muscle weakness (generalized): Secondary | ICD-10-CM | POA: Diagnosis not present

## 2019-08-05 DIAGNOSIS — M25612 Stiffness of left shoulder, not elsewhere classified: Secondary | ICD-10-CM | POA: Diagnosis not present

## 2019-08-05 DIAGNOSIS — M546 Pain in thoracic spine: Secondary | ICD-10-CM | POA: Diagnosis not present

## 2019-08-05 DIAGNOSIS — M25512 Pain in left shoulder: Secondary | ICD-10-CM | POA: Diagnosis not present

## 2019-08-06 ENCOUNTER — Encounter: Payer: Self-pay | Admitting: Nurse Practitioner

## 2019-08-06 DIAGNOSIS — D696 Thrombocytopenia, unspecified: Secondary | ICD-10-CM | POA: Insufficient documentation

## 2019-08-06 DIAGNOSIS — D472 Monoclonal gammopathy: Secondary | ICD-10-CM | POA: Insufficient documentation

## 2019-08-07 ENCOUNTER — Encounter: Payer: Self-pay | Admitting: Nurse Practitioner

## 2019-08-07 ENCOUNTER — Other Ambulatory Visit: Payer: Self-pay

## 2019-08-07 ENCOUNTER — Ambulatory Visit (INDEPENDENT_AMBULATORY_CARE_PROVIDER_SITE_OTHER): Payer: Medicare Other | Admitting: Nurse Practitioner

## 2019-08-07 VITALS — BP 105/60 | HR 65 | Temp 98.0°F | Wt 171.2 lb

## 2019-08-07 DIAGNOSIS — M8589 Other specified disorders of bone density and structure, multiple sites: Secondary | ICD-10-CM

## 2019-08-07 DIAGNOSIS — R351 Nocturia: Secondary | ICD-10-CM

## 2019-08-07 DIAGNOSIS — M810 Age-related osteoporosis without current pathological fracture: Secondary | ICD-10-CM | POA: Insufficient documentation

## 2019-08-07 DIAGNOSIS — M419 Scoliosis, unspecified: Secondary | ICD-10-CM | POA: Diagnosis not present

## 2019-08-07 DIAGNOSIS — M8580 Other specified disorders of bone density and structure, unspecified site: Secondary | ICD-10-CM | POA: Insufficient documentation

## 2019-08-07 NOTE — Assessment & Plan Note (Signed)
Improved, denies further symptoms at this time.  Continue to monitor fluid intake prior to bed time and physical therapy exercises at home.  Return for worsening.

## 2019-08-07 NOTE — Progress Notes (Signed)
BP 105/60   Pulse 65   Temp 98 F (36.7 C) (Oral)   Wt 171 lb 3.2 oz (77.7 kg)   LMP  (LMP Unknown)   SpO2 98%   BMI 31.31 kg/m    Subjective:    Patient ID: Cynthia Hurst, female    DOB: [birth year], 84 y.o.   MRN: [id number]  HPI: Cynthia Hurst is a 84 y.o. female  Chief Complaint  Patient presents with  . Follow-up    4 week f/up   LEFT SIDE PAIN WITH NOCTURIA/URGENCY Has had ongoing left side pain for a month, was last seen 06/21/2019 (office), 07/05/2019 (virtual), and 07/09/19 (office) for this. Noted at last visit to have significant kyphosis and scoliosis of spine.  Imaging obtained and diffuse osteopenia noted + lumbar scoliosis with degenerative changes + severe thoracic scoliosis with degenerative changes.  Order was sent to physical therapy and patient has been attending sessions.  She states she has two sessions left.  Reports overall improvement in pain and urinary symptoms at this time.  Has been performing stretches as instructed at home for strengthening, states this has helped "pain a whole lot".  Denies any further symptoms or concerns at this time.  Relevant past medical, surgical, family and social history reviewed and updated as indicated. Interim medical history since our last visit reviewed. Allergies and medications reviewed and updated.  Review of Systems  Constitutional: Negative for activity change, appetite change, diaphoresis, fatigue and fever.  Respiratory: Negative for cough, chest tightness and shortness of breath.   Cardiovascular: Negative for chest pain, palpitations and leg swelling.  Gastrointestinal: Negative for abdominal distention, abdominal pain, constipation, diarrhea, nausea and vomiting.  Genitourinary: Negative.   Musculoskeletal: Negative for back pain.  Neurological: Negative for dizziness, syncope, weakness, light-headedness, numbness and headaches.  Psychiatric/Behavioral: Negative.     Per HPI unless specifically indicated  above     Objective:    BP 105/60   Pulse 65   Temp 98 F (36.7 C) (Oral)   Wt 171 lb 3.2 oz (77.7 kg)   LMP  (LMP Unknown)   SpO2 98%   BMI 31.31 kg/m   Wt Readings from Last 3 Encounters:  08/07/19 171 lb 3.2 oz (77.7 kg)  07/05/19 175 lb (79.4 kg)  07/03/19 175 lb 3.2 oz (79.5 kg)    Physical Exam Vitals signs and nursing note reviewed.  Constitutional:      General: She is awake. She is not in acute distress.    Appearance: She is well-developed and overweight. She is not ill-appearing.  HENT:     Head: Normocephalic.     Right Ear: Hearing normal.     Left Ear: Hearing normal.  Eyes:     General: Lids are normal.        Right eye: No discharge.        Left eye: No discharge.     Conjunctiva/sclera: Conjunctivae normal.     Pupils: Pupils are equal, round, and reactive to light.  Neck:     Musculoskeletal: Normal range of motion and neck supple.     Vascular: No carotid bruit.  Cardiovascular:     Rate and Rhythm: Normal rate and regular rhythm.     Heart sounds: Normal heart sounds. No murmur. No gallop.   Pulmonary:     Effort: Pulmonary effort is normal. No accessory muscle usage or respiratory distress.     Breath sounds: Normal breath sounds.  Abdominal:  General: Bowel sounds are normal.     Palpations: Abdomen is soft.  Musculoskeletal:     Right lower leg: No edema.     Left lower leg: No edema.     Comments: Back with significant scoliosis and mild kyphosis.  No further tenderness on palpation.  Full ROM present without discomfort.  Skin:    General: Skin is warm and dry.  Neurological:     Mental Status: She is alert and oriented to person, place, and time.  Psychiatric:        Attention and Perception: Attention normal.        Mood and Affect: Mood normal.        Behavior: Behavior normal. Behavior is cooperative.        Thought Content: Thought content normal.        Judgment: Judgment normal.     Results for orders placed or performed  in visit on 08/01/19  Comprehensive metabolic panel  Result Value Ref Range   Sodium 141 135 - 145 mmol/L   Potassium 3.8 3.5 - 5.1 mmol/L   Chloride 107 98 - 111 mmol/L   CO2 26 22 - 32 mmol/L   Glucose, Bld 125 (H) 70 - 99 mg/dL   BUN 13 8 - 23 mg/dL   Creatinine, Ser 1.24 (H) 0.44 - 1.00 mg/dL   Calcium 9.7 8.9 - 10.3 mg/dL   Total Protein 7.4 6.5 - 8.1 g/dL   Albumin 3.8 3.5 - 5.0 g/dL   AST 22 15 - 41 U/L   ALT 10 0 - 44 U/L   Alkaline Phosphatase 95 38 - 126 U/L   Total Bilirubin 1.0 0.3 - 1.2 mg/dL   GFR calc non Af Amer 40 (L) >60 mL/min   GFR calc Af Amer 47 (L) >60 mL/min   Anion gap 8 5 - 15  CBC with Differential/Platelet  Result Value Ref Range   WBC 6.2 4.0 - 10.5 K/uL   RBC 4.15 3.87 - 5.11 MIL/uL   Hemoglobin 12.5 12.0 - 15.0 g/dL   HCT 38.2 36.0 - 46.0 %   MCV 92.0 80.0 - 100.0 fL   MCH 30.1 26.0 - 34.0 pg   MCHC 32.7 30.0 - 36.0 g/dL   RDW 14.6 11.5 - 15.5 %   Platelets 166 150 - 400 K/uL   nRBC 0.0 0.0 - 0.2 %   Neutrophils Relative % 57 %   Neutro Abs 3.6 1.7 - 7.7 K/uL   Lymphocytes Relative 30 %   Lymphs Abs 1.9 0.7 - 4.0 K/uL   Monocytes Relative 10 %   Monocytes Absolute 0.6 0.1 - 1.0 K/uL   Eosinophils Relative 2 %   Eosinophils Absolute 0.1 0.0 - 0.5 K/uL   Basophils Relative 1 %   Basophils Absolute 0.0 0.0 - 0.1 K/uL   Immature Granulocytes 0 %   Abs Immature Granulocytes 0.01 0.00 - 0.07 K/uL  Multiple Myeloma Panel (SPEP&IFE w/QIG)  Result Value Ref Range   IgG (Immunoglobin G), Serum 1,196 586 - 1,602 mg/dL   IgA 190 64 - 422 mg/dL   IgM (Immunoglobulin M), Srm 232 (H) 26 - 217 mg/dL   Total Protein ELP 6.6 6.0 - 8.5 g/dL   Albumin SerPl Elph-Mcnc 3.6 2.9 - 4.4 g/dL   Alpha 1 0.3 0.0 - 0.4 g/dL   Alpha2 Glob SerPl Elph-Mcnc 0.5 0.4 - 1.0 g/dL   B-Globulin SerPl Elph-Mcnc 0.9 0.7 - 1.3 g/dL   Gamma Glob SerPl Elph-Mcnc 1.2 0.4 -  1.8 g/dL   M Protein SerPl Elph-Mcnc Not Observed Not Observed g/dL   Globulin, Total 3.0 2.2 - 3.9 g/dL    Albumin/Glob SerPl 1.3 0.7 - 1.7   IFE 1 Comment    Please Note Comment   Kappa/lambda light chains  Result Value Ref Range   Kappa free light chain 36.9 (H) 3.3 - 19.4 mg/L   Lamda free light chains 25.9 5.7 - 26.3 mg/L   Kappa, lamda light chain ratio 1.42 0.26 - 1.65      Assessment & Plan:   Problem List Items Addressed This Visit      Musculoskeletal and Integument   Scoliosis - Primary    Continue physical therapy to complete sessions.  Recommend continued stretches and exercises at home as instructed.  Continue Voltaren gel and heat as needed.  Return to office if pain returns or worsens.      Osteopenia    Present to lumbar and thoracic spine.  Continue daily Vit D3 and Calcium supplements + Evista.  Recommend continued daily PT exercises for strengthening at home.  Check Vit D level next visit.        Other   Nocturia    Improved, denies further symptoms at this time.  Continue to monitor fluid intake prior to bed time and physical therapy exercises at home.  Return for worsening.            Follow up plan: Return in about 6 months (around 02/07/2020) for HTN/HLD, CKD, Gout, Vit D and B12.

## 2019-08-07 NOTE — Assessment & Plan Note (Signed)
Continue physical therapy to complete sessions.  Recommend continued stretches and exercises at home as instructed.  Continue Voltaren gel and heat as needed.  Return to office if pain returns or worsens.

## 2019-08-07 NOTE — Patient Instructions (Signed)
Scoliosis  Scoliosis is a condition in which the spine curves sideways. Normally, the spine does not curve side-to-side (laterally). With scoliosis, the spine may curve to the left, to the right, or in both directions. The curve of the spine is measured by angles in degrees. Scoliosis can affect people at any age, but it is more common among children and adolescents. What are the causes? The cause of scoliosis is not always known. It may be caused by:  A birth defect.  A disease that can cause problems in the muscles or imbalance of the body, such as cerebral palsy or muscular dystrophy. What are the signs or symptoms? This condition may not cause any symptoms. If you do have symptoms, they may include:  Leaning to one side.  Sunken chest and uneven shoulders.  One side of the body being different or larger than the other side (asymmetry).  An abnormal curve in the back.  Pain, which may limit physical activity.  Shortness of breath.  Bowel or bladder control problems, such as not knowing when you have to go. This can be a sign of nerve damage. How is this diagnosed? This condition is diagnosed based on:  Your medical history.  Your symptoms.  A physical exam. This may include: ? Examining your nerves, muscles, and reflexes (neurological exam). ? Testing the movement of your spine (range of motion study).  Imaging tests, such as: ? X-rays. ? MRI. How is this treated? Treatment for this condition depends on the severity of the symptoms. Treatment may include:  Observation to make sure that your scoliosis does not get worse (progress). You may need to have regular visits with your health care provider.  A back brace to prevent scoliosis from progressing. This may be needed during times of fast growth (growth spurts), such as during adolescence.  Medicine to help relieve pain.  Physical therapy.  Surgery. Follow these instructions at home: If you have a brace:   Wear the brace as told by your health care provider. Remove it only as told by your health care provider.  Loosen the brace if your fingers or toes tingle, become numb, or turn cold and blue.  Keep the brace clean.  If the brace is not waterproof: ? Do not let it get wet. ? Cover it with a watertight covering when you take a bath or a shower. General instructions  Take over-the-counter and prescription medicines only as told by your health care provider.  Donot drive or use heavy machinery while taking prescription pain medicine.  If physical therapy was prescribed, do exercises as instructed.  Before starting any new sports or physical activities, ask your health care provider whether they are safe for you.  Keep all follow-up visits as told by your health care provider. This is important. Contact a health care provider if you have:  Problems with your back brace, such as skin irritation or discomfort.  Back pain that does not get better with medicine. Get help right away if:  Your legs feel weak.  You cannot move your legs.  You cannot control when you urinate or pass stool (loss of bladder or bowel control). Summary  Scoliosis is a condition of having a spine that curves sideways. The spine may curve to the left, to the right, or in both directions.  This condition may be caused by birth defects or diseases that affect muscles and body balance.  Follow your health care provider's instructions about wearing a brace, doing physical  activities, and keeping follow-up visits. This information is not intended to replace advice given to you by your health care provider. Make sure you discuss any questions you have with your health care provider. Document Released: 11/25/2000 Document Revised: 04/30/2018 Document Reviewed: 03/15/2018 Elsevier Patient Education  2020 [employer]  

## 2019-08-07 NOTE — Assessment & Plan Note (Addendum)
Present to lumbar and thoracic spine.  Continue daily Vit D3 and Calcium supplements + Evista.  Recommend continued daily PT exercises for strengthening at home.  Check Vit D level next visit.

## 2019-08-08 ENCOUNTER — Other Ambulatory Visit: Payer: Self-pay

## 2019-08-08 ENCOUNTER — Inpatient Hospital Stay (HOSPITAL_BASED_OUTPATIENT_CLINIC_OR_DEPARTMENT_OTHER): Payer: Medicare Other | Admitting: Oncology

## 2019-08-08 ENCOUNTER — Encounter: Payer: Self-pay | Admitting: Oncology

## 2019-08-08 VITALS — BP 103/58 | HR 60 | Temp 98.2°F | Resp 18 | Wt 170.0 lb

## 2019-08-08 DIAGNOSIS — N183 Chronic kidney disease, stage 3 unspecified: Secondary | ICD-10-CM

## 2019-08-08 DIAGNOSIS — D472 Monoclonal gammopathy: Secondary | ICD-10-CM | POA: Diagnosis not present

## 2019-08-08 DIAGNOSIS — Z79899 Other long term (current) drug therapy: Secondary | ICD-10-CM | POA: Diagnosis not present

## 2019-08-08 DIAGNOSIS — I129 Hypertensive chronic kidney disease with stage 1 through stage 4 chronic kidney disease, or unspecified chronic kidney disease: Secondary | ICD-10-CM | POA: Diagnosis not present

## 2019-08-08 NOTE — Progress Notes (Signed)
Pt in for follow up, request MD call daughter during visit. Denies any concerns today.

## 2019-08-08 NOTE — Progress Notes (Signed)
Hematology/Oncology follow up note [facility] [phone] [phone]) [phone]   Patient Care Team: Venita Lick, NP as PCP - General (Nurse Practitioner) Marlowe Sax, MD as Referring Physician (Internal Medicine) Earlie Server, MD as Consulting Physician (Oncology)  REFERRING PROVIDER: [facility] REASON FOR VISIT Follow up for management of MGUS  HISTORY OF PRESENTING ILLNESS:  Cynthia Hurst is a  84 y.o.  female with PMH listed below who was referred to me for evaluation of hypercalcemia.  Patient was referred to see a see rheumatology for evaluation of positive ANA, joint pain.  Lab workup reviewed hypercalcemia of 11, normal PTH, normal 25-hydroxy vitamin D level, normal TSH, SPEP showed  monoclonal spike of 0.4.  Immunofixation showed elevated IgA monoclonal protein with kappa light chain specificity. Patient reports feeling well, she denies any fatigue, weight loss, back pain history of fracture.  She has bilateral knee replacement reports having posterior thigh pain bilaterally. Patient denies taking any calcium supplements.  Never smoker  # Bone marrow biopsy results was reviewed with patient and her family members in details.  Showed 2% plasma cells, consistent with MGUS .INTERVAL HISTORY Cynthia Hurst is a 84 y.o. female who has above history reviewed by me today presents for follow-up visit for management of IgA MGUS. Patient reports feeling well at baseline.  Denies any new complaints. Denies any new bone pain, fatigue, or weight loss .  Currently off prednisone for treatment of polymyalgia rheumatic syndromes. She follows up with Dr. Meda Coffee at the [facility] and was last seen on 05/28/2019. Marland Kitchen   Review of Systems  Constitutional: Negative for chills, fever, malaise/fatigue and weight loss.  HENT: Negative for congestion, ear discharge, ear pain, nosebleeds, sinus pain and sore throat.   Eyes: Negative for  double vision, photophobia, pain, discharge and redness.  Respiratory: Negative for cough, hemoptysis, sputum production, shortness of breath and wheezing.   Cardiovascular: Negative for chest pain, palpitations, orthopnea, claudication and leg swelling.  Gastrointestinal: Negative for abdominal pain, blood in stool, constipation, diarrhea, heartburn, melena, nausea and vomiting.  Genitourinary: Negative for dysuria, flank pain, frequency, hematuria and urgency.  Musculoskeletal: Positive for joint pain. Negative for back pain, myalgias and neck pain.  Skin: Negative for itching and rash.  Neurological: Negative for dizziness, tingling, tremors, sensory change, focal weakness, weakness and headaches.  Endo/Heme/Allergies: Negative for environmental allergies. Does not bruise/bleed easily.  Psychiatric/Behavioral: Negative for depression, hallucinations and substance abuse. The patient is not nervous/anxious.     MEDICAL HISTORY:  Past Medical History:  Diagnosis Date  . Chronic kidney disease   . GERD (gastroesophageal reflux disease)   . Gout   . Hypertension   . Hypopotassemia   . Osteoarthritis   . Osteoporosis    LUMBAR SPINE  . Vitamin B 12 deficiency   . Vitamin D deficiency     SURGICAL HISTORY: Past Surgical History:  Procedure Laterality Date  . ABDOMINAL HYSTERECTOMY     PARTIAL  . EYE SURGERY Left 05/2018   cataract surgery   . JOINT REPLACEMENT Bilateral 2010,2011   KNEE    SOCIAL HISTORY: Social History   Socioeconomic History  . Marital status: Married    Spouse name: Not on file  . Number of children: Not on file  . Years of education: Not on file  . Highest education level: High school graduate  Occupational History  . Not on file  Social Needs  . Financial resource strain: Not hard at all  .  Food insecurity    Worry: Never true    Inability: Never true  . Transportation needs    Medical: No    Non-medical: No  Tobacco Use  . Smoking status:  Never Smoker  . Smokeless tobacco: Never Used  Substance and Sexual Activity  . Alcohol use: No  . Drug use: No  . Sexual activity: Yes  Lifestyle  . Physical activity    Days per week: 0 days    Minutes per session: 0 min  . Stress: Not at all  Relationships  . Social connections    Talks on phone: More than three times a week    Gets together: More than three times a week    Attends religious service: More than 4 times per year    Active member of club or organization: Yes    Attends meetings of clubs or organizations: More than 4 times per year    Relationship status: Married  . Intimate partner violence    Fear of current or ex partner: No    Emotionally abused: No    Physically abused: No    Forced sexual activity: No  Other Topics Concern  . Not on file  Social History Narrative  . Not on file    FAMILY HISTORY: Family History  Problem Relation Age of Onset  . Heart disease Mother   . Cancer Father   . Alzheimer's disease Father   . Breast cancer Neg Hx     ALLERGIES:  has No Known Allergies.  MEDICATIONS:  Current Outpatient Medications  Medication Sig Dispense Refill  . allopurinol (ZYLOPRIM) 300 MG tablet TAKE 1 TABLET DAILY 90 tablet 3  . atenolol (TENORMIN) 100 MG tablet TAKE 1 TABLET DAILY 90 tablet 3  . cholecalciferol (VITAMIN D) 1000 UNITS tablet Take 1,000 Units by mouth daily.    . cyanocobalamin 2000 MCG tablet Take 2,000 mcg by mouth daily.    Marland Kitchen KLOR-CON M20 20 MEQ tablet TAKE 1 TABLET DAILY 90 tablet 1  . NIFEdipine (ADALAT CC) 60 MG 24 hr tablet Take 1 tablet (60 mg total) by mouth daily. 90 tablet 3  . omeprazole (PRILOSEC) 20 MG capsule Take 1 capsule (20 mg total) by mouth daily. 90 capsule 3  . raloxifene (EVISTA) 60 MG tablet Take 1 tablet (60 mg total) by mouth daily. 90 tablet 3   No current facility-administered medications for this visit.      PHYSICAL EXAMINATION: ECOG PERFORMANCE STATUS: 0 - Asymptomatic Vitals:   08/08/19  1042  BP: (!) 103/58  Pulse: 60  Resp: 18  Temp: 98.2 F (36.8 C)   Filed Weights   08/08/19 1042  Weight: 170 lb (77.1 kg)    Physical Exam Constitutional:      General: She is not in acute distress. HENT:     Head: Normocephalic and atraumatic.  Eyes:     General: No scleral icterus.    Pupils: Pupils are equal, round, and reactive to light.  Neck:     Musculoskeletal: Normal range of motion and neck supple.  Cardiovascular:     Rate and Rhythm: Normal rate and regular rhythm.     Heart sounds: Normal heart sounds.  Pulmonary:     Effort: Pulmonary effort is normal. No respiratory distress.     Breath sounds: Normal breath sounds. No wheezing or rales.  Chest:     Chest wall: No tenderness.  Abdominal:     General: Bowel sounds are normal. There is no  distension.     Palpations: Abdomen is soft. There is no mass.     Tenderness: There is no abdominal tenderness.  Musculoskeletal: Normal range of motion.        General: No deformity.  Lymphadenopathy:     Cervical: No cervical adenopathy.  Skin:    General: Skin is warm and dry.     Findings: No erythema or rash.  Neurological:     Mental Status: She is alert and oriented to person, place, and time.     Cranial Nerves: No cranial nerve deficit.     Coordination: Coordination normal.  Psychiatric:        Behavior: Behavior normal.        Thought Content: Thought content normal.      LABORATORY DATA:  I have reviewed the data as listed Lab Results  Component Value Date   WBC 6.2 08/01/2019   HGB 12.5 08/01/2019   HCT 38.2 08/01/2019   MCV 92.0 08/01/2019   PLT 166 08/01/2019   Recent Labs    06/11/19 0919 07/09/19 1004 08/01/19 1051  NA 141 140 141  K 4.1 4.0 3.8  CL 103 102 107  CO2 '25 24 26  ' GLUCOSE 86 106* 125*  BUN '18 11 13  ' CREATININE 1.00 1.02* 1.24*  CALCIUM 9.9 9.6 9.7  GFRNONAA 53* 51* 40*  GFRAA 61 59* 47*  PROT 6.5 6.5 7.4  ALBUMIN 4.0 4.0 3.8  AST '19 16 22  ' ALT '8 8 10   ' ALKPHOS 103 109 95  BILITOT 0.7 0.6 1.0    03/12/2018 multiple myeloma work up  [facility]: SPEP showed  monoclonal spike of 0.4.  Immunofixation showed elevated IgA monoclonal protein with kappa light chain specificity.Urine light chain ratio elevated at 12.54, no monoclonal spike on UPEP.   Lab Results  Component Value Date   TOTALPROTELP 6.6 08/01/2019   ALBUMINELP 3.6 10/19/2018   A1GS 0.3 10/19/2018   A2GS 0.6 10/19/2018   BETS 0.9 10/19/2018   GAMS 1.2 10/19/2018   MSPIKE Not Observed 10/19/2018   SPEI Comment 10/19/2018   Lab Results  Component Value Date   KPAFRELGTCHN 36.9 (H) 08/01/2019   LAMBDASER 25.9 08/01/2019   KAPLAMBRATIO 1.42 08/01/2019    ASSESSMENT & PLAN:  1. MGUS (monoclonal gammopathy of unknown significance)   2. CKD (chronic kidney disease), stage III ([place])   Labs are reviewed and discussed with patient. M protein continues to be under detectable level.  Normal free light chain ratio Stable disease. Recommend continue monitoring every 6 months.  CKD, stage III, creatinine 1.24, avoid nephro toxins.  Discussed with patient. Patient's daughter was called and updated.  All questions were answered. The patient knows to call the clinic with any problems questions or concerns.  Return of visit: 6 months, repeat CBC, SPEP 1 week prior to visits.. Orders Placed This Encounter  Procedures  . Kappa/lambda light chains    Standing Status:   Future    Standing Expiration Date:   08/07/2020  . Multiple Myeloma Panel (SPEP&IFE w/QIG)    Standing Status:   Future    Standing Expiration Date:   08/07/2020  . CBC with Differential/Platelet    Standing Status:   Future    Standing Expiration Date:   08/07/2020  . Comprehensive metabolic panel    Standing Status:   Future    Standing Expiration Date:   08/07/2020    We spent sufficient time to discuss many aspect of care, questions were answered  to patient's satisfaction.    Earlie Server, MD, PhD Hematology  Oncology [facility] Pager- [phone] 08/08/2019

## 2019-08-12 DIAGNOSIS — M546 Pain in thoracic spine: Secondary | ICD-10-CM | POA: Diagnosis not present

## 2019-08-12 DIAGNOSIS — M6281 Muscle weakness (generalized): Secondary | ICD-10-CM | POA: Diagnosis not present

## 2019-08-12 DIAGNOSIS — M25512 Pain in left shoulder: Secondary | ICD-10-CM | POA: Diagnosis not present

## 2019-08-12 DIAGNOSIS — M25612 Stiffness of left shoulder, not elsewhere classified: Secondary | ICD-10-CM | POA: Diagnosis not present

## 2019-08-27 DIAGNOSIS — M25512 Pain in left shoulder: Secondary | ICD-10-CM | POA: Diagnosis not present

## 2019-08-27 DIAGNOSIS — M6281 Muscle weakness (generalized): Secondary | ICD-10-CM | POA: Diagnosis not present

## 2019-08-27 DIAGNOSIS — M546 Pain in thoracic spine: Secondary | ICD-10-CM | POA: Diagnosis not present

## 2019-08-27 DIAGNOSIS — M25612 Stiffness of left shoulder, not elsewhere classified: Secondary | ICD-10-CM | POA: Diagnosis not present

## 2019-09-03 DIAGNOSIS — M6281 Muscle weakness (generalized): Secondary | ICD-10-CM | POA: Diagnosis not present

## 2019-09-03 DIAGNOSIS — M546 Pain in thoracic spine: Secondary | ICD-10-CM | POA: Diagnosis not present

## 2019-09-03 DIAGNOSIS — M25512 Pain in left shoulder: Secondary | ICD-10-CM | POA: Diagnosis not present

## 2019-09-03 DIAGNOSIS — M25612 Stiffness of left shoulder, not elsewhere classified: Secondary | ICD-10-CM | POA: Diagnosis not present

## 2019-09-10 DIAGNOSIS — M6281 Muscle weakness (generalized): Secondary | ICD-10-CM | POA: Diagnosis not present

## 2019-09-10 DIAGNOSIS — M546 Pain in thoracic spine: Secondary | ICD-10-CM | POA: Diagnosis not present

## 2019-09-10 DIAGNOSIS — M25512 Pain in left shoulder: Secondary | ICD-10-CM | POA: Diagnosis not present

## 2019-09-10 DIAGNOSIS — M25612 Stiffness of left shoulder, not elsewhere classified: Secondary | ICD-10-CM | POA: Diagnosis not present

## 2019-09-11 ENCOUNTER — Other Ambulatory Visit: Payer: Self-pay

## 2019-09-11 ENCOUNTER — Ambulatory Visit (INDEPENDENT_AMBULATORY_CARE_PROVIDER_SITE_OTHER): Payer: Medicare Other | Admitting: Nurse Practitioner

## 2019-09-11 ENCOUNTER — Encounter: Payer: Self-pay | Admitting: Nurse Practitioner

## 2019-09-11 VITALS — BP 120/76 | HR 64 | Temp 98.0°F | Wt 170.0 lb

## 2019-09-11 DIAGNOSIS — D472 Monoclonal gammopathy: Secondary | ICD-10-CM | POA: Diagnosis not present

## 2019-09-11 DIAGNOSIS — I129 Hypertensive chronic kidney disease with stage 1 through stage 4 chronic kidney disease, or unspecified chronic kidney disease: Secondary | ICD-10-CM | POA: Diagnosis not present

## 2019-09-11 DIAGNOSIS — N183 Chronic kidney disease, stage 3 unspecified: Secondary | ICD-10-CM

## 2019-09-11 NOTE — Assessment & Plan Note (Signed)
Followed by oncology, continue to review notes.

## 2019-09-11 NOTE — Progress Notes (Signed)
BP 120/76   Pulse 64   Temp 98 F (36.7 C) (Oral)   Wt 170 lb (77.1 kg)   LMP  (LMP Unknown)   SpO2 98%   BMI 31.09 kg/m    Subjective:    Patient ID: Cynthia Hurst, female    DOB: [birth year], 84 y.o.   MRN: [id number]  HPI: Cynthia Hurst is a 84 y.o. female  Chief Complaint  Patient presents with  . Follow-up    26m . Hypertension   HYPERTENSION Continues to take Atenolol and Nifedipine daily.  Hypertension status: stable  Satisfied with current treatment? yes Duration of hypertension: chronic BP monitoring frequency:  rarely BP range: 120/70-80 at home BP medication side effects:  no Medication compliance: good compliance Aspirin: no Recurrent headaches: no Visual changes: no Palpitations: no Dyspnea: no Chest pain: no Lower extremity edema: no Dizzy/lightheaded: no   CHRONIC KIDNEY DISEASE CKD status: stable Medications renally dose: yes Previous renal evaluation: no Pneumovax:  Up to Date Influenza Vaccine:  refused  MGUS: Followed by [facility] with recent visit 08/08/19.  No changes made and labs stable on review of note.    Relevant past medical, surgical, family and social history reviewed and updated as indicated. Interim medical history since our last visit reviewed. Allergies and medications reviewed and updated.  Review of Systems  Constitutional: Negative for activity change, appetite change, diaphoresis, fatigue and fever.  Respiratory: Negative for cough, chest tightness and shortness of breath.   Cardiovascular: Negative for chest pain, palpitations and leg swelling.  Gastrointestinal: Negative for abdominal distention, abdominal pain, constipation, diarrhea, nausea and vomiting.  Neurological: Negative for dizziness, syncope, weakness, light-headedness, numbness and headaches.  Psychiatric/Behavioral: Negative.     Per HPI unless specifically indicated above     Objective:    BP 120/76   Pulse 64   Temp 98 F (36.7 C) (Oral)    Wt 170 lb (77.1 kg)   LMP  (LMP Unknown)   SpO2 98%   BMI 31.09 kg/m   Wt Readings from Last 3 Encounters:  09/11/19 170 lb (77.1 kg)  08/08/19 170 lb (77.1 kg)  08/07/19 171 lb 3.2 oz (77.7 kg)    Physical Exam Vitals signs and nursing note reviewed.  Constitutional:      General: She is awake. She is not in acute distress.    Appearance: She is well-developed and overweight. She is not ill-appearing.  HENT:     Head: Normocephalic.     Right Ear: Hearing normal.     Left Ear: Hearing normal.  Eyes:     General: Lids are normal.        Right eye: No discharge.        Left eye: No discharge.     Conjunctiva/sclera: Conjunctivae normal.     Pupils: Pupils are equal, round, and reactive to light.  Neck:     Musculoskeletal: Normal range of motion and neck supple.     Vascular: No carotid bruit.  Cardiovascular:     Rate and Rhythm: Normal rate and regular rhythm.     Heart sounds: Normal heart sounds. No murmur. No gallop.   Pulmonary:     Effort: Pulmonary effort is normal. No accessory muscle usage or respiratory distress.     Breath sounds: Normal breath sounds.  Abdominal:     General: Bowel sounds are normal.     Palpations: Abdomen is soft.  Musculoskeletal:     Right lower leg: No  edema.     Left lower leg: No edema.  Skin:    General: Skin is warm and dry.  Neurological:     Mental Status: She is alert and oriented to person, place, and time.  Psychiatric:        Attention and Perception: Attention normal.        Mood and Affect: Mood normal.        Behavior: Behavior normal. Behavior is cooperative.        Thought Content: Thought content normal.        Judgment: Judgment normal.     Results for orders placed or performed in visit on 08/01/19  Comprehensive metabolic panel  Result Value Ref Range   Sodium 141 135 - 145 mmol/L   Potassium 3.8 3.5 - 5.1 mmol/L   Chloride 107 98 - 111 mmol/L   CO2 26 22 - 32 mmol/L   Glucose, Bld 125 (H) 70 - 99 mg/dL    BUN 13 8 - 23 mg/dL   Creatinine, Ser 1.24 (H) 0.44 - 1.00 mg/dL   Calcium 9.7 8.9 - 10.3 mg/dL   Total Protein 7.4 6.5 - 8.1 g/dL   Albumin 3.8 3.5 - 5.0 g/dL   AST 22 15 - 41 U/L   ALT 10 0 - 44 U/L   Alkaline Phosphatase 95 38 - 126 U/L   Total Bilirubin 1.0 0.3 - 1.2 mg/dL   GFR calc non Af Amer 40 (L) >60 mL/min   GFR calc Af Amer 47 (L) >60 mL/min   Anion gap 8 5 - 15  CBC with Differential/Platelet  Result Value Ref Range   WBC 6.2 4.0 - 10.5 K/uL   RBC 4.15 3.87 - 5.11 MIL/uL   Hemoglobin 12.5 12.0 - 15.0 g/dL   HCT 38.2 36.0 - 46.0 %   MCV 92.0 80.0 - 100.0 fL   MCH 30.1 26.0 - 34.0 pg   MCHC 32.7 30.0 - 36.0 g/dL   RDW 14.6 11.5 - 15.5 %   Platelets 166 150 - 400 K/uL   nRBC 0.0 0.0 - 0.2 %   Neutrophils Relative % 57 %   Neutro Abs 3.6 1.7 - 7.7 K/uL   Lymphocytes Relative 30 %   Lymphs Abs 1.9 0.7 - 4.0 K/uL   Monocytes Relative 10 %   Monocytes Absolute 0.6 0.1 - 1.0 K/uL   Eosinophils Relative 2 %   Eosinophils Absolute 0.1 0.0 - 0.5 K/uL   Basophils Relative 1 %   Basophils Absolute 0.0 0.0 - 0.1 K/uL   Immature Granulocytes 0 %   Abs Immature Granulocytes 0.01 0.00 - 0.07 K/uL  Multiple Myeloma Panel (SPEP&IFE w/QIG)  Result Value Ref Range   IgG (Immunoglobin G), Serum 1,196 586 - 1,602 mg/dL   IgA 190 64 - 422 mg/dL   IgM (Immunoglobulin M), Srm 232 (H) 26 - 217 mg/dL   Total Protein ELP 6.6 6.0 - 8.5 g/dL   Albumin SerPl Elph-Mcnc 3.6 2.9 - 4.4 g/dL   Alpha 1 0.3 0.0 - 0.4 g/dL   Alpha2 Glob SerPl Elph-Mcnc 0.5 0.4 - 1.0 g/dL   B-Globulin SerPl Elph-Mcnc 0.9 0.7 - 1.3 g/dL   Gamma Glob SerPl Elph-Mcnc 1.2 0.4 - 1.8 g/dL   M Protein SerPl Elph-Mcnc Not Observed Not Observed g/dL   Globulin, Total 3.0 2.2 - 3.9 g/dL   Albumin/Glob SerPl 1.3 0.7 - 1.7   IFE 1 Comment    Please Note Comment   Kappa/lambda light chains  Result Value Ref Range   Kappa free light chain 36.9 (H) 3.3 - 19.4 mg/L   Lamda free light chains 25.9 5.7 - 26.3 mg/L    Kappa, lamda light chain ratio 1.42 0.26 - 1.65      Assessment & Plan:   Problem List Items Addressed This Visit      Cardiovascular and Mediastinum   Benign hypertension with chronic kidney disease    Chronic, ongoing.  BP at goal at home and in office today.  Continue current medication regimen.  CMP today.        Genitourinary   CKD (chronic kidney disease), stage III ([facility]) - Primary    Chronic with CKD 3.  Monitor CMP regularly and refer to nephrology as needed if worsening CKD.        Relevant Orders   Comprehensive metabolic panel     Other   MGUS (monoclonal gammopathy of unknown significance)    Followed by oncology, continue to review notes.          Follow up plan: Return in about 6 months (around 03/10/2020) for CKD 3, HTN, MGUS.

## 2019-09-11 NOTE — Assessment & Plan Note (Signed)
Chronic with CKD 3.  Monitor CMP regularly and refer to nephrology as needed if worsening CKD.

## 2019-09-11 NOTE — Assessment & Plan Note (Addendum)
Chronic, ongoing.  BP at goal at home and in office today.  Continue current medication regimen.  CMP today.

## 2019-09-11 NOTE — Patient Instructions (Signed)
Before flu shot take Tylenol 1000 MG and apply ice to upper arm where you would like to have shot.     Influenza, Adult Influenza is also called "the flu." It is an infection in the lungs, nose, and throat (respiratory tract). It is caused by a virus. The flu causes symptoms that are similar to symptoms of a cold. It also causes a high fever and body aches. The flu spreads easily from person to person (is contagious). Getting a flu shot (influenza vaccination) every year is the best way to prevent the flu. What are the causes? This condition is caused by the influenza virus. You can get the virus by:  Breathing in droplets that are in the air from the cough or sneeze of a person who has the virus.  Touching something that has the virus on it (is contaminated) and then touching your mouth, nose, or eyes. What increases the risk? Certain things may make you more likely to get the flu. These include:  Not washing your hands often.  Having close contact with many people during cold and flu season.  Touching your mouth, eyes, or nose without first washing your hands.  Not getting a flu shot every year. You may have a higher risk for the flu, along with serious problems such as a lung infection (pneumonia), if you:  Are older than 65.  Are pregnant.  Have a weakened disease-fighting system (immune system) because of a disease or taking certain medicines.  Have a long-term (chronic) illness, such as: ? Heart, kidney, or lung disease. ? Diabetes. ? Asthma.  Have a liver disorder.  Are very overweight (morbidly obese).  Have anemia. This is a condition that affects your red blood cells. What are the signs or symptoms? Symptoms usually begin suddenly and last 4-14 days. They may include:  Fever and chills.  Headaches, body aches, or muscle aches.  Sore throat.  Cough.  Runny or stuffy (congested) nose.  Chest discomfort.  Not wanting to eat as much as normal (poor  appetite).  Weakness or feeling tired (fatigue).  Dizziness.  Feeling sick to your stomach (nauseous) or throwing up (vomiting). How is this treated? If the flu is found early, you can be treated with medicine that can help reduce how bad the illness is and how long it lasts (antiviral medicine). This may be given by mouth (orally) or through an IV tube. Taking care of yourself at home can help your symptoms get better. Your doctor may suggest:  Taking over-the-counter medicines.  Drinking plenty of fluids. The flu often goes away on its own. If you have very bad symptoms or other problems, you may be treated in a hospital. Follow these instructions at home:     Activity  Rest as needed. Get plenty of sleep.  Stay home from work or school as told by your doctor. ? Do not leave home until you do not have a fever for 24 hours without taking medicine. ? Leave home only to visit your doctor. Eating and drinking  Take an ORS (oral rehydration solution). This is a drink that is sold at pharmacies and stores.  Drink enough fluid to keep your pee (urine) pale yellow.  Drink clear fluids in small amounts as you are able. Clear fluids include: ? Water. ? Ice chips. ? Fruit juice that has water added (diluted fruit juice). ? Low-calorie sports drinks.  Eat bland, easy-to-digest foods in small amounts as you are able. These foods include: ?  Bananas. ? Applesauce. ? Rice. ? Lean meats. ? Toast. ? Crackers.  Do not eat or drink: ? Fluids that have a lot of sugar or caffeine. ? Alcohol. ? Spicy or fatty foods. General instructions  Take over-the-counter and prescription medicines only as told by your doctor.  Use a cool mist humidifier to add moisture to the air in your home. This can make it easier for you to breathe.  Cover your mouth and nose when you cough or sneeze.  Wash your hands with soap and water often, especially after you cough or sneeze. If you cannot use soap  and water, use alcohol-based hand sanitizer.  Keep all follow-up visits as told by your doctor. This is important. How is this prevented?   Get a flu shot every year. You may get the flu shot in late summer, fall, or winter. Ask your doctor when you should get your flu shot.  Avoid contact with people who are sick during fall and winter (cold and flu season). Contact a doctor if:  You get new symptoms.  You have: ? Chest pain. ? Watery poop (diarrhea). ? A fever.  Your cough gets worse.  You start to have more mucus.  You feel sick to your stomach.  You throw up. Get help right away if you:  Have shortness of breath.  Have trouble breathing.  Have skin or nails that turn a bluish color.  Have very bad pain or stiffness in your neck.  Get a sudden headache.  Get sudden pain in your face or ear.  Cannot eat or drink without throwing up. Summary  Influenza ("the flu") is an infection in the lungs, nose, and throat. It is caused by a virus.  Take over-the-counter and prescription medicines only as told by your doctor.  Getting a flu shot every year is the best way to avoid getting the flu. This information is not intended to replace advice given to you by your health care provider. Make sure you discuss any questions you have with your health care provider. Document Released: 09/06/2008 Document Revised: 05/16/2018 Document Reviewed: 05/16/2018 Elsevier Patient Education  2020 [employer].

## 2019-09-12 LAB — COMPREHENSIVE METABOLIC PANEL
ALT: 7 IU/L (ref 0–32)
AST: 15 IU/L (ref 0–40)
Albumin/Globulin Ratio: 1.5 (ref 1.2–2.2)
Albumin: 3.8 g/dL (ref 3.6–4.6)
Alkaline Phosphatase: 104 IU/L (ref 39–117)
BUN/Creatinine Ratio: 13 (ref 12–28)
BUN: 14 mg/dL (ref 8–27)
Bilirubin Total: 0.6 mg/dL (ref 0.0–1.2)
CO2: 24 mmol/L (ref 20–29)
Calcium: 9.5 mg/dL (ref 8.7–10.3)
Chloride: 106 mmol/L (ref 96–106)
Creatinine, Ser: 1.07 mg/dL — ABNORMAL HIGH (ref 0.57–1.00)
GFR calc Af Amer: 56 mL/min/{1.73_m2} — ABNORMAL LOW (ref >59)
GFR calc non Af Amer: 48 mL/min/{1.73_m2} — ABNORMAL LOW (ref >59)
Globulin, Total: 2.5 g/dL (ref 1.5–4.5)
Glucose: 106 mg/dL — ABNORMAL HIGH (ref 65–99)
Potassium: 3.9 mmol/L (ref 3.5–5.2)
Sodium: 143 mmol/L (ref 134–144)
Total Protein: 6.3 g/dL (ref 6.0–8.5)

## 2019-11-05 ENCOUNTER — Other Ambulatory Visit: Payer: Self-pay

## 2019-12-03 ENCOUNTER — Other Ambulatory Visit: Payer: Self-pay | Admitting: Nurse Practitioner

## 2020-02-03 ENCOUNTER — Other Ambulatory Visit: Payer: Self-pay

## 2020-02-03 ENCOUNTER — Inpatient Hospital Stay: Payer: Medicare Other | Attending: Oncology

## 2020-02-03 DIAGNOSIS — D472 Monoclonal gammopathy: Secondary | ICD-10-CM | POA: Insufficient documentation

## 2020-02-03 DIAGNOSIS — N183 Chronic kidney disease, stage 3 unspecified: Secondary | ICD-10-CM | POA: Diagnosis not present

## 2020-02-03 LAB — CBC WITH DIFFERENTIAL/PLATELET
Abs Immature Granulocytes: 0.01 10*3/uL (ref 0.00–0.07)
Basophils Absolute: 0 10*3/uL (ref 0.0–0.1)
Basophils Relative: 1 %
Eosinophils Absolute: 0.1 10*3/uL (ref 0.0–0.5)
Eosinophils Relative: 2 %
HCT: 36.8 % (ref 36.0–46.0)
Hemoglobin: 12 g/dL (ref 12.0–15.0)
Immature Granulocytes: 0 %
Lymphocytes Relative: 26 %
Lymphs Abs: 1.5 10*3/uL (ref 0.7–4.0)
MCH: 30.7 pg (ref 26.0–34.0)
MCHC: 32.6 g/dL (ref 30.0–36.0)
MCV: 94.1 fL (ref 80.0–100.0)
Monocytes Absolute: 0.5 10*3/uL (ref 0.1–1.0)
Monocytes Relative: 9 %
Neutro Abs: 3.6 10*3/uL (ref 1.7–7.7)
Neutrophils Relative %: 62 %
Platelets: 155 10*3/uL (ref 150–400)
RBC: 3.91 MIL/uL (ref 3.87–5.11)
RDW: 14.8 % (ref 11.5–15.5)
WBC: 5.7 10*3/uL (ref 4.0–10.5)
nRBC: 0 % (ref 0.0–0.2)

## 2020-02-03 LAB — COMPREHENSIVE METABOLIC PANEL
ALT: 8 U/L (ref 0–44)
AST: 21 U/L (ref 15–41)
Albumin: 3.7 g/dL (ref 3.5–5.0)
Alkaline Phosphatase: 86 U/L (ref 38–126)
Anion gap: 9 (ref 5–15)
BUN: 20 mg/dL (ref 8–23)
CO2: 26 mmol/L (ref 22–32)
Calcium: 9.6 mg/dL (ref 8.9–10.3)
Chloride: 105 mmol/L (ref 98–111)
Creatinine, Ser: 1.01 mg/dL — ABNORMAL HIGH (ref 0.44–1.00)
GFR calc Af Amer: 60 mL/min — ABNORMAL LOW (ref >60)
GFR calc non Af Amer: 51 mL/min — ABNORMAL LOW (ref >60)
Glucose, Bld: 137 mg/dL — ABNORMAL HIGH (ref 70–99)
Potassium: 3.8 mmol/L (ref 3.5–5.1)
Sodium: 140 mmol/L (ref 135–145)
Total Bilirubin: 0.9 mg/dL (ref 0.3–1.2)
Total Protein: 7 g/dL (ref 6.5–8.1)

## 2020-02-04 ENCOUNTER — Other Ambulatory Visit: Payer: Self-pay | Admitting: Nurse Practitioner

## 2020-02-04 LAB — KAPPA/LAMBDA LIGHT CHAINS
Kappa free light chain: 32.2 mg/L — ABNORMAL HIGH (ref 3.3–19.4)
Kappa, lambda light chain ratio: 0.95 (ref 0.26–1.65)
Lambda free light chains: 33.8 mg/L — ABNORMAL HIGH (ref 5.7–26.3)

## 2020-02-05 LAB — MULTIPLE MYELOMA PANEL, SERUM
Albumin SerPl Elph-Mcnc: 3.5 g/dL (ref 2.9–4.4)
Albumin/Glob SerPl: 1.2 (ref 0.7–1.7)
Alpha 1: 0.3 g/dL (ref 0.0–0.4)
Alpha2 Glob SerPl Elph-Mcnc: 0.6 g/dL (ref 0.4–1.0)
B-Globulin SerPl Elph-Mcnc: 0.9 g/dL (ref 0.7–1.3)
Gamma Glob SerPl Elph-Mcnc: 1.2 g/dL (ref 0.4–1.8)
Globulin, Total: 3 g/dL (ref 2.2–3.9)
IgA: 186 mg/dL (ref 64–422)
IgG (Immunoglobin G), Serum: 1129 mg/dL (ref 586–1602)
IgM (Immunoglobulin M), Srm: 239 mg/dL — ABNORMAL HIGH (ref 26–217)
Total Protein ELP: 6.5 g/dL (ref 6.0–8.5)

## 2020-02-07 ENCOUNTER — Encounter: Payer: Self-pay | Admitting: Oncology

## 2020-02-07 ENCOUNTER — Ambulatory Visit: Payer: Medicare Other | Admitting: Nurse Practitioner

## 2020-02-07 ENCOUNTER — Other Ambulatory Visit: Payer: Self-pay

## 2020-02-07 ENCOUNTER — Ambulatory Visit (INDEPENDENT_AMBULATORY_CARE_PROVIDER_SITE_OTHER): Payer: Medicare Other | Admitting: Nurse Practitioner

## 2020-02-07 ENCOUNTER — Encounter: Payer: Self-pay | Admitting: Nurse Practitioner

## 2020-02-07 VITALS — BP 138/58 | HR 57 | Temp 98.1°F | Ht 62.75 in | Wt 167.6 lb

## 2020-02-07 DIAGNOSIS — R109 Unspecified abdominal pain: Secondary | ICD-10-CM

## 2020-02-07 DIAGNOSIS — M545 Low back pain, unspecified: Secondary | ICD-10-CM | POA: Insufficient documentation

## 2020-02-07 DIAGNOSIS — G8929 Other chronic pain: Secondary | ICD-10-CM | POA: Diagnosis not present

## 2020-02-07 DIAGNOSIS — R030 Elevated blood-pressure reading, without diagnosis of hypertension: Secondary | ICD-10-CM

## 2020-02-07 LAB — UA/M W/RFLX CULTURE, ROUTINE
Bilirubin, UA: NEGATIVE
Glucose, UA: NEGATIVE
Ketones, UA: NEGATIVE
Leukocytes,UA: NEGATIVE
Nitrite, UA: NEGATIVE
Protein,UA: NEGATIVE
RBC, UA: NEGATIVE
Specific Gravity, UA: 1.02 (ref 1.005–1.030)
Urobilinogen, Ur: 0.2 mg/dL (ref 0.2–1.0)
pH, UA: 5 (ref 5.0–7.5)

## 2020-02-07 MED ORDER — DICLOFENAC SODIUM 3 % EX GEL: 1.0000 "application " | Freq: Two times a day (BID) | CUTANEOUS | 0 refills | Status: DC | PRN

## 2020-02-07 NOTE — Progress Notes (Signed)
Patient contacted for follow up. Pt reports having left flank pain, some times worse than others.

## 2020-02-07 NOTE — Assessment & Plan Note (Signed)
Present for months, ongoing.  UA in office today negative and with no UTI symptoms - pain likely not due genitourinary process.  Given small amount of tenderness on palpation, pain likely due to musculoskeletal injury.  Will treat with conservative measures - heat/ice, antiinflammatory gel, and back exercises.  Return or call clinic if not better in 4 weeks, may consider PT.

## 2020-02-07 NOTE — Patient Instructions (Signed)

## 2020-02-07 NOTE — Progress Notes (Signed)
BP (!) 138/58 (BP Location: Left Arm, Cuff Size: Normal)   Pulse (!) 57   Temp 98.1 F (36.7 C) (Oral)   Ht 5' 2.75" (1.594 m)   Wt 167 lb 9.6 oz (76 kg)   LMP  (LMP Unknown)   SpO2 96%   BMI 29.93 kg/m    Subjective:    Patient ID: Cynthia Hurst, female    DOB: [birth year], 85 y.o.   MRN: [id number]  HPI: Cynthia Hurst is a 85 y.o. female  Chief Complaint  Patient presents with  . Flank Pain    Right side. Ongoing over 1 week.    BACK PAIN Duration: months Mechanism of injury: unknown Location: Left Onset: sudden Severity: severe Quality: sharp, stabbing and tender Frequency: intermittent; at night usually when laying in bed Radiation: none Aggravating factors: laying Alleviating factors: therminex, aspar cream,  Status: worse Treatments attempted: creams, listed above  Relief with NSAIDs?: No NSAIDs Taken Nighttime pain:  yes Paresthesias / decreased sensation:  no Bowel / bladder incontinence:  no Fevers:  no Dysuria / urinary frequency:  no  URINARY SYMPTOMS Onset: months Dysuria: no Urinary frequency: no Urgency: no Small volume voids: no Symptom severity: n/a Urinary incontinence: no Foul odor: no Hematuria: no Abdominal pain: no Back pain: yes Suprapubic pain/pressure: no Flank pain: yes Fever:  no Nausea/Vomiting: no Relief with cranberry juice: not tried Relief with pyridium: not tried Status: stable Previous urinary tract infection: no Recurrent urinary tract infection: no Sexual activity: No sexually active History of sexually transmitted disease: no Treatments attempted: none   No Known Allergies  Outpatient Encounter Medications as of 02/07/2020  Medication Sig  . allopurinol (ZYLOPRIM) 300 MG tablet TAKE 1 TABLET DAILY  . atenolol (TENORMIN) 100 MG tablet TAKE 1 TABLET DAILY  . cholecalciferol (VITAMIN D) 1000 UNITS tablet Take 1,000 Units by mouth daily.  . cyanocobalamin 2000 MCG tablet Take 2,000 mcg by mouth daily.  Marland Kitchen  KLOR-CON M20 20 MEQ tablet TAKE 1 TABLET DAILY  . meclizine (ANTIVERT) 25 MG tablet Take 25 mg by mouth daily as needed for dizziness.  Marland Kitchen NIFEdipine (ADALAT CC) 60 MG 24 hr tablet TAKE 1 TABLET DAILY  . omeprazole (PRILOSEC) 20 MG capsule TAKE 1 CAPSULE DAILY  . raloxifene (EVISTA) 60 MG tablet TAKE 1 TABLET DAILY  . Diclofenac Sodium 3 % GEL Apply 1 application topically 2 (two) times daily as needed (for back pain).   No facility-administered encounter medications on file as of 02/07/2020.   Patient Active Problem List   Diagnosis Date Noted  . Chronic left-sided low back pain without sciatica 02/07/2020  . Scoliosis 08/07/2019  . Osteopenia 08/07/2019  . MGUS (monoclonal gammopathy of unknown significance) 08/06/2019  . Thrombocytopenia ([place]) 08/06/2019  . AKI (acute kidney injury) ([place]) 03/21/2018  . Advanced care planning/counseling discussion 07/12/2017  . Nummular dermatitis 06/16/2017  . GERD (gastroesophageal reflux disease) 12/16/2016  . Allergic rhinitis 09/19/2016  . Muscle cramps 12/11/2015  . Benign hypertension with chronic kidney disease 12/11/2015  . Gout 06/10/2015  . CKD (chronic kidney disease), stage III 06/10/2015  . Vitamin B12 deficiency 06/10/2015  . Vitamin D deficiency 06/10/2015   Past Medical History:  Diagnosis Date  . Chronic kidney disease   . GERD (gastroesophageal reflux disease)   . Gout   . Hypertension   . Hypopotassemia   . Osteoarthritis   . Osteoporosis    LUMBAR SPINE  . Vitamin B 12 deficiency   . Vitamin D  deficiency    Review of Systems  Constitutional: Negative.  Negative for activity change, appetite change, fatigue and fever.  Gastrointestinal: Negative.  Negative for abdominal pain.  Genitourinary: Negative.  Negative for decreased urine volume, dyspareunia, dysuria, enuresis, flank pain, hematuria and urgency.  Musculoskeletal: Positive for back pain and myalgias. Negative for arthralgias, gait problem, joint swelling, neck  pain and neck stiffness.  Skin: Negative.   Neurological: Negative.  Negative for dizziness, weakness, light-headedness, numbness and headaches.  Psychiatric/Behavioral: Positive for sleep disturbance (back pain at night). Negative for agitation and confusion. The patient is not nervous/anxious.   Per HPI unless specifically indicated above     Objective:    BP (!) 138/58 (BP Location: Left Arm, Cuff Size: Normal)   Pulse (!) 57   Temp 98.1 F (36.7 C) (Oral)   Ht 5' 2.75" (1.594 m)   Wt 167 lb 9.6 oz (76 kg)   LMP  (LMP Unknown)   SpO2 96%   BMI 29.93 kg/m   Wt Readings from Last 3 Encounters:  02/07/20 167 lb 9.6 oz (76 kg)  09/11/19 170 lb (77.1 kg)  08/08/19 170 lb (77.1 kg)    Physical Exam  Constitutional: She is oriented to person, place, and time. She appears well-developed and well-nourished. No distress.  Abdominal: Soft. Bowel sounds are normal. She exhibits no distension. There is no abdominal tenderness. There is no guarding and no CVA tenderness.  Musculoskeletal:        General: Tenderness (upon palpation to lower left/midline back) present. No deformity or edema. Normal range of motion.     Cervical back: Normal range of motion and neck supple.  Neurological: She is alert and oriented to person, place, and time. Coordination normal.  Skin: Skin is warm and dry. She is not diaphoretic. No erythema.  Psychiatric: She has a normal mood and affect. Her behavior is normal. Judgment and thought content normal.      Assessment & Plan:   Problem List Items Addressed This Visit      Other   Chronic left-sided low back pain without sciatica - Primary    Present for months, ongoing.  UA in office today negative and with no UTI symptoms - pain likely not due genitourinary process.  Given small amount of tenderness on palpation, pain likely due to musculoskeletal injury.  Will treat with conservative measures - heat/ice, antiinflammatory gel, and back exercises.  Return or  call clinic if not better in 4 weeks, may consider PT.      Relevant Medications   Diclofenac Sodium 3 % GEL    Other Visit Diagnoses    Flank pain       Relevant Orders   UA/M w/rflx Culture, Routine   Elevated blood pressure reading       BP elevated in office today, improved on recheck.  Advised paitent to monitor BP at home and call or return to clinic if readings consistently >140/90.       Follow up plan: Return if symptoms worsen or fail to improve.

## 2020-02-10 ENCOUNTER — Other Ambulatory Visit: Payer: Self-pay

## 2020-02-10 ENCOUNTER — Inpatient Hospital Stay: Payer: Medicare Other | Attending: Oncology | Admitting: Oncology

## 2020-02-10 ENCOUNTER — Other Ambulatory Visit: Payer: Self-pay | Admitting: Family Medicine

## 2020-02-10 VITALS — BP 96/59 | HR 65 | Temp 96.3°F | Wt 167.7 lb

## 2020-02-10 DIAGNOSIS — R109 Unspecified abdominal pain: Secondary | ICD-10-CM

## 2020-02-10 DIAGNOSIS — N183 Chronic kidney disease, stage 3 unspecified: Secondary | ICD-10-CM | POA: Diagnosis not present

## 2020-02-10 DIAGNOSIS — Z79899 Other long term (current) drug therapy: Secondary | ICD-10-CM | POA: Diagnosis not present

## 2020-02-10 DIAGNOSIS — I129 Hypertensive chronic kidney disease with stage 1 through stage 4 chronic kidney disease, or unspecified chronic kidney disease: Secondary | ICD-10-CM | POA: Diagnosis not present

## 2020-02-10 DIAGNOSIS — D472 Monoclonal gammopathy: Secondary | ICD-10-CM

## 2020-02-10 NOTE — Progress Notes (Signed)
Hematology/Oncology follow up note [facility] [phone] [phone]   Patient Care Team: Venita Lick, NP as PCP - General (Nurse Practitioner) Marlowe Sax, MD as Referring Physician (Internal Medicine) Earlie Server, MD as Consulting Physician (Oncology)  REFERRING PROVIDER: [facility] Rheymatology REASON FOR VISIT Follow up for management of MGUS  HISTORY OF PRESENTING ILLNESS:  Cynthia Hurst is a  85 y.o.  female with PMH listed below who was referred to me for evaluation of hypercalcemia.  Patient was referred to see a see rheumatology for evaluation of positive ANA, joint pain.  Lab workup reviewed hypercalcemia of 11, normal PTH, normal 25-hydroxy vitamin D level, normal TSH, SPEP showed  monoclonal spike of 0.4.  Immunofixation showed elevated IgA monoclonal protein with kappa light chain specificity. Patient reports feeling well, she denies any fatigue, weight loss, back pain history of fracture.  She has bilateral knee replacement reports having posterior thigh pain bilaterally. Patient denies taking any calcium supplements.  Never smoker  # Bone marrow biopsy results was reviewed with patient and her family members in details.  Showed 2% plasma cells, consistent with MGUS .INTERVAL HISTORY Cynthia Hurst is a 85 y.o. female who has above history reviewed by me today presents for follow-up visit for management of IgA MGUS. Patient reports feeling well at baseline.  Patient reports intermittent left flank pain.  Recently was seen by primary care provider.  UA was obtained and was negative.  No exacerbating or alleviating factors.  Pain resolves spontaneously.  Review of Systems  Constitutional: Negative for chills, fever, malaise/fatigue and weight loss.  HENT: Negative for sore throat.   Eyes: Negative for redness.  Respiratory: Negative for cough, shortness of breath and wheezing.   Cardiovascular: Negative for chest  pain, palpitations and leg swelling.  Gastrointestinal: Negative for abdominal pain, blood in stool, nausea and vomiting.  Genitourinary: Negative for dysuria.       Left flank pain, intermittent.   Musculoskeletal: Negative for myalgias.  Skin: Negative for rash.  Neurological: Negative for dizziness, tingling and tremors.  Endo/Heme/Allergies: Does not bruise/bleed easily.  Psychiatric/Behavioral: Negative for hallucinations.    MEDICAL HISTORY:  Past Medical History:  Diagnosis Date  . Chronic kidney disease   . GERD (gastroesophageal reflux disease)   . Gout   . Hypertension   . Hypopotassemia   . Osteoarthritis   . Osteoporosis    LUMBAR SPINE  . Vitamin B 12 deficiency   . Vitamin D deficiency     SURGICAL HISTORY: Past Surgical History:  Procedure Laterality Date  . ABDOMINAL HYSTERECTOMY     PARTIAL  . EYE SURGERY Left 05/2018   cataract surgery   . JOINT REPLACEMENT Bilateral 2010,2011   KNEE    SOCIAL HISTORY: Social History   Socioeconomic History  . Marital status: Married    Spouse name: Not on file  . Number of children: Not on file  . Years of education: Not on file  . Highest education level: High school graduate  Occupational History  . Not on file  Tobacco Use  . Smoking status: Never Smoker  . Smokeless tobacco: Never Used  Substance and Sexual Activity  . Alcohol use: No  . Drug use: No  . Sexual activity: Yes  Other Topics Concern  . Not on file  Social History Narrative  . Not on file   Social Determinants of Health   Financial Resource Strain:   . Difficulty of Paying Living Expenses: Not on file  Food Insecurity:   . Worried About Charity fundraiser in the Last Year: Not on file  . Ran Out of Food in the Last Year: Not on file  Transportation Needs:   . Lack of Transportation (Medical): Not on file  . Lack of Transportation (Non-Medical): Not on file  Physical Activity:   . Days of Exercise per Week: Not on file  .  Minutes of Exercise per Session: Not on file  Stress:   . Feeling of Stress : Not on file  Social Connections:   . Frequency of Communication with Friends and Family: Not on file  . Frequency of Social Gatherings with Friends and Family: Not on file  . Attends Religious Services: Not on file  . Active Member of Clubs or Organizations: Not on file  . Attends Archivist Meetings: Not on file  . Marital Status: Not on file  Intimate Partner Violence:   . Fear of Current or Ex-Partner: Not on file  . Emotionally Abused: Not on file  . Physically Abused: Not on file  . Sexually Abused: Not on file    FAMILY HISTORY: Family History  Problem Relation Age of Onset  . Heart disease Mother   . Cancer Father   . Alzheimer's disease Father   . Breast cancer Neg Hx     ALLERGIES:  has No Known Allergies.  MEDICATIONS:  Current Outpatient Medications  Medication Sig Dispense Refill  . allopurinol (ZYLOPRIM) 300 MG tablet TAKE 1 TABLET DAILY 90 tablet 3  . atenolol (TENORMIN) 100 MG tablet TAKE 1 TABLET DAILY 90 tablet 3  . cholecalciferol (VITAMIN D) 1000 UNITS tablet Take 1,000 Units by mouth daily.    . cyanocobalamin 2000 MCG tablet Take 2,000 mcg by mouth daily.    . Diclofenac Sodium 3 % GEL Apply 1 application topically 2 (two) times daily as needed (for back pain). 100 g 0  . KLOR-CON M20 20 MEQ tablet TAKE 1 TABLET DAILY 90 tablet 1  . meclizine (ANTIVERT) 25 MG tablet Take 25 mg by mouth daily as needed for dizziness.    Marland Kitchen NIFEdipine (ADALAT CC) 60 MG 24 hr tablet TAKE 1 TABLET DAILY 90 tablet 1  . omeprazole (PRILOSEC) 20 MG capsule TAKE 1 CAPSULE DAILY 90 capsule 3  . raloxifene (EVISTA) 60 MG tablet TAKE 1 TABLET DAILY 90 tablet 3   No current facility-administered medications for this visit.     PHYSICAL EXAMINATION: ECOG PERFORMANCE STATUS: 0 - Asymptomatic There were no vitals filed for this visit. There were no vitals filed for this visit.  Physical  Exam Constitutional:      General: She is not in acute distress. HENT:     Head: Normocephalic and atraumatic.  Eyes:     General: No scleral icterus.    Pupils: Pupils are equal, round, and reactive to light.  Cardiovascular:     Rate and Rhythm: Normal rate and regular rhythm.     Heart sounds: Normal heart sounds.  Pulmonary:     Effort: Pulmonary effort is normal. No respiratory distress.     Breath sounds: Normal breath sounds. No wheezing or rales.  Chest:     Chest wall: No tenderness.  Abdominal:     General: Bowel sounds are normal. There is no distension.     Palpations: Abdomen is soft. There is no mass.     Tenderness: There is no abdominal tenderness.  Musculoskeletal:        General: No  deformity. Normal range of motion.     Cervical back: Normal range of motion and neck supple.  Lymphadenopathy:     Cervical: No cervical adenopathy.  Skin:    General: Skin is warm and dry.     Findings: No erythema or rash.  Neurological:     Mental Status: She is alert and oriented to person, place, and time. Mental status is at baseline.     Cranial Nerves: No cranial nerve deficit.  Psychiatric:        Mood and Affect: Mood normal.      LABORATORY DATA:  I have reviewed the data as listed Lab Results  Component Value Date   WBC 5.7 02/03/2020   HGB 12.0 02/03/2020   HCT 36.8 02/03/2020   MCV 94.1 02/03/2020   PLT 155 02/03/2020   Recent Labs    08/01/19 1051 09/11/19 0936 02/03/20 1136  NA 141 143 140  K 3.8 3.9 3.8  CL 107 106 105  CO2 _0 GLUCOSE 125* 106* 137*  BUN _1 CREATININE 1.24* 1.07* 1.01*  CALCIUM 9.7 9.5 9.6  GFRNONAA 40* 48* 51*  GFRAA 47* 56* 60*  PROT 7.4 6.3 7.0  ALBUMIN 3.8 3.8 3.7  AST _2 ALT _3 ALKPHOS 95 104 86  BILITOT 1.0 0.6 0.9    03/12/2018 multiple myeloma work up  [facility] clinic: SPEP showed  monoclonal spike of 0.4.  Immunofixation showed elevated IgA monoclonal protein with kappa light chain  specificity.Urine light chain ratio elevated at 12.54, no monoclonal spike on UPEP.   Lab Results  Component Value Date   TOTALPROTELP 6.5 02/03/2020   ALBUMINELP 3.6 10/19/2018   A1GS 0.3 10/19/2018   A2GS 0.6 10/19/2018   BETS 0.9 10/19/2018   GAMS 1.2 10/19/2018   MSPIKE Not Observed 10/19/2018   SPEI Comment 10/19/2018   Lab Results  Component Value Date   KPAFRELGTCHN 32.2 (H) 02/03/2020   LAMBDASER 33.8 (H) 02/03/2020   KAPLAMBRATIO 0.95 02/03/2020    ASSESSMENT & PLAN:  1. MGUS (monoclonal gammopathy of unknown significance)   2. Stage 3 chronic kidney disease, unspecified whether stage 3a or 3b CKD   3. Left flank pain    IgA MGUS with kappa light chain restriction. Reviewed and discussed with patient. Protein continues to be undetectable.  Normal free light chain ratio. Continue to monitor.  #CKD, stage III, creatinine has been stable. #Intermittent left flank pain, etiology unknown.  UA was checked recently and was negative. I will obtain ultrasound renal for further evaluation.   All questions were answered. The patient knows to call the clinic with any problems questions or concerns.  Return of visit: 6 months, repeat CBC, SPEP 1 week prior to visits.. Orders Placed This Encounter  Procedures  . US RENAL    Standing Status:   Future    Standing Expiration Date:   04/11/2021    Order Specific Question:   Reason for Exam (SYMPTOM  OR DIAGNOSIS REQUIRED)    Answer:   CKD, left flank pain    Order Specific Question:   Preferred imaging location?    Answer:   [facility]  . CBC with Differential/Platelet    Standing Status:   Future    Standing Expiration Date:   02/09/2021  . Comprehensive metabolic panel    Standing Status:   Future    Standing Expiration Date:   02/09/2021  . Kappa/lambda light chains  Standing Status:   Future    Standing Expiration Date:   02/09/2021  . Multiple Myeloma Panel (SPEP&IFE w/QIG)    Standing Status:   Future     Standing Expiration Date:   02/09/2021  . UA/M w/rflx Culture, Routine    Standing Status:   Future    Standing Expiration Date:   02/09/2021    We spent sufficient time to discuss many aspect of care, questions were answered to patient's satisfaction.    Earlie Server, MD, PhD Hematology Oncology [facility] Pager- [phone] 02/10/2020

## 2020-02-11 NOTE — Telephone Encounter (Signed)
Routing to provider  

## 2020-02-14 ENCOUNTER — Other Ambulatory Visit: Payer: Self-pay

## 2020-02-14 ENCOUNTER — Ambulatory Visit
Admission: RE | Admit: 2020-02-14 | Discharge: 2020-02-14 | Disposition: A | Discharge status: Home / Self Care | Payer: Medicare Other | Source: Ambulatory Visit | Attending: Oncology | Admitting: Oncology

## 2020-02-14 DIAGNOSIS — N183 Chronic kidney disease, stage 3 unspecified: Secondary | ICD-10-CM | POA: Insufficient documentation

## 2020-02-14 DIAGNOSIS — N281 Cyst of kidney, acquired: Secondary | ICD-10-CM | POA: Diagnosis not present

## 2020-02-24 ENCOUNTER — Other Ambulatory Visit: Payer: Self-pay | Admitting: Nurse Practitioner

## 2020-03-10 ENCOUNTER — Ambulatory Visit (INDEPENDENT_AMBULATORY_CARE_PROVIDER_SITE_OTHER): Payer: Medicare Other | Admitting: Nurse Practitioner

## 2020-03-10 ENCOUNTER — Encounter: Payer: Self-pay | Admitting: Nurse Practitioner

## 2020-03-10 ENCOUNTER — Other Ambulatory Visit: Payer: Self-pay

## 2020-03-10 VITALS — BP 129/72 | HR 57 | Temp 98.1°F | Wt 165.0 lb

## 2020-03-10 DIAGNOSIS — D472 Monoclonal gammopathy: Secondary | ICD-10-CM | POA: Diagnosis not present

## 2020-03-10 DIAGNOSIS — M109 Gout, unspecified: Secondary | ICD-10-CM | POA: Diagnosis not present

## 2020-03-10 DIAGNOSIS — N1831 Chronic kidney disease, stage 3a: Secondary | ICD-10-CM

## 2020-03-10 DIAGNOSIS — I129 Hypertensive chronic kidney disease with stage 1 through stage 4 chronic kidney disease, or unspecified chronic kidney disease: Secondary | ICD-10-CM | POA: Diagnosis not present

## 2020-03-10 LAB — UA/M W/RFLX CULTURE, ROUTINE
Bilirubin, UA: NEGATIVE
Glucose, UA: NEGATIVE
Ketones, UA: NEGATIVE
Leukocytes,UA: NEGATIVE
Nitrite, UA: NEGATIVE
Protein,UA: NEGATIVE
RBC, UA: NEGATIVE
Specific Gravity, UA: 1.02 (ref 1.005–1.030)
Urobilinogen, Ur: 0.2 mg/dL (ref 0.2–1.0)
pH, UA: 5 (ref 5.0–7.5)

## 2020-03-10 NOTE — Assessment & Plan Note (Signed)
Chronic, stable.  Continue Allopurinol at current dose, but if GFR <60 consider reduction to 100 MG daily.  Check uric acid today.  No flare in years.

## 2020-03-10 NOTE — Assessment & Plan Note (Signed)
Chronic, ongoing.  Continue collaboration with oncology. 

## 2020-03-10 NOTE — Assessment & Plan Note (Signed)
Chronic, ongoing.  BP at goal at home and in office today.  Continue current medication regimen and adjust as needed.  BMP today.  Renal dose medications as needed.

## 2020-03-10 NOTE — Assessment & Plan Note (Signed)
Chronic with CKD 3.  Monitor BMP regularly and refer to nephrology as needed if worsening CKD.  Renal dose medications as needed, consider reduction Allopurinol if GFR <60.  Obtain urinalysis today to further assess, due to recent renal u/s findings of questionable bladder wall thickening.

## 2020-03-10 NOTE — Patient Instructions (Signed)
DASH Eating Plan DASH stands for "Dietary Approaches to Stop Hypertension." The DASH eating plan is a healthy eating plan that has been shown to reduce high blood pressure (hypertension). It may also reduce your risk for type 2 diabetes, heart disease, and stroke. The DASH eating plan may also help with weight loss. What are tips for following this plan?  General guidelines  Avoid eating more than 2,300 mg (milligrams) of salt (sodium) a day. If you have hypertension, you may need to reduce your sodium intake to 1,500 mg a day.  Limit alcohol intake to no more than 1 drink a day for nonpregnant women and 2 drinks a day for men. One drink equals 12 oz of beer, 5 oz of wine, or 1 oz of hard liquor.  Work with your health care provider to maintain a healthy body weight or to lose weight. Ask what an ideal weight is for you.  Get at least 30 minutes of exercise that causes your heart to beat faster (aerobic exercise) most days of the week. Activities may include walking, swimming, or biking.  Work with your health care provider or diet and nutrition specialist (dietitian) to adjust your eating plan to your individual calorie needs. Reading food labels   Check food labels for the amount of sodium per serving. Choose foods with less than 5 percent of the Daily Value of sodium. Generally, foods with less than 300 mg of sodium per serving fit into this eating plan.  To find whole grains, look for the word "whole" as the first word in the ingredient list. Shopping  Buy products labeled as "low-sodium" or "no salt added."  Buy fresh foods. Avoid canned foods and premade or frozen meals. Cooking  Avoid adding salt when cooking. Use salt-free seasonings or herbs instead of table salt or sea salt. Check with your health care provider or pharmacist before using salt substitutes.  Do not fry foods. Cook foods using healthy methods such as baking, boiling, grilling, and broiling instead.  Cook with  heart-healthy oils, such as olive, canola, soybean, or sunflower oil. Meal planning  Eat a balanced diet that includes: ? 5 or more servings of fruits and vegetables each day. At each meal, try to fill half of your plate with fruits and vegetables. ? Up to 6-8 servings of whole grains each day. ? Less than 6 oz of lean meat, poultry, or fish each day. A 3-oz serving of meat is about the same size as a deck of cards. One egg equals 1 oz. ? 2 servings of low-fat dairy each day. ? A serving of nuts, seeds, or beans 5 times each week. ? Heart-healthy fats. Healthy fats called Omega-3 fatty acids are found in foods such as flaxseeds and coldwater fish, like sardines, salmon, and mackerel.  Limit how much you eat of the following: ? Canned or prepackaged foods. ? Food that is high in trans fat, such as fried foods. ? Food that is high in saturated fat, such as fatty meat. ? Sweets, desserts, sugary drinks, and other foods with added sugar. ? Full-fat dairy products.  Do not salt foods before eating.  Try to eat at least 2 vegetarian meals each week.  Eat more home-cooked food and less restaurant, buffet, and fast food.  When eating at a restaurant, ask that your food be prepared with less salt or no salt, if possible. What foods are recommended? The items listed may not be a complete list. Talk with your dietitian about   what dietary choices are best for you. Grains Whole-grain or whole-wheat bread. Whole-grain or whole-wheat pasta. Brown rice. Oatmeal. Quinoa. Bulgur. Whole-grain and low-sodium cereals. Pita bread. Low-fat, low-sodium crackers. Whole-wheat flour tortillas. Vegetables Fresh or frozen vegetables (raw, steamed, roasted, or grilled). Low-sodium or reduced-sodium tomato and vegetable juice. Low-sodium or reduced-sodium tomato sauce and tomato paste. Low-sodium or reduced-sodium canned vegetables. Fruits All fresh, dried, or frozen fruit. Canned fruit in natural juice (without  added sugar). Meat and other protein foods Skinless chicken or turkey. Ground chicken or turkey. Pork with fat trimmed off. Fish and seafood. Egg whites. Dried beans, peas, or lentils. Unsalted nuts, nut butters, and seeds. Unsalted canned beans. Lean cuts of beef with fat trimmed off. Low-sodium, lean deli meat. Dairy Low-fat (1%) or fat-free (skim) milk. Fat-free, low-fat, or reduced-fat cheeses. Nonfat, low-sodium ricotta or cottage cheese. Low-fat or nonfat yogurt. Low-fat, low-sodium cheese. Fats and oils Soft margarine without trans fats. Vegetable oil. Low-fat, reduced-fat, or light mayonnaise and salad dressings (reduced-sodium). Canola, safflower, olive, soybean, and sunflower oils. Avocado. Seasoning and other foods Herbs. Spices. Seasoning mixes without salt. Unsalted popcorn and pretzels. Fat-free sweets. What foods are not recommended? The items listed may not be a complete list. Talk with your dietitian about what dietary choices are best for you. Grains Baked goods made with fat, such as croissants, muffins, or some breads. Dry pasta or rice meal packs. Vegetables Creamed or fried vegetables. Vegetables in a cheese sauce. Regular canned vegetables (not low-sodium or reduced-sodium). Regular canned tomato sauce and paste (not low-sodium or reduced-sodium). Regular tomato and vegetable juice (not low-sodium or reduced-sodium). Pickles. Olives. Fruits Canned fruit in a light or heavy syrup. Fried fruit. Fruit in cream or butter sauce. Meat and other protein foods Fatty cuts of meat. Ribs. Fried meat. Bacon. Sausage. Bologna and other processed lunch meats. Salami. Fatback. Hotdogs. Bratwurst. Salted nuts and seeds. Canned beans with added salt. Canned or smoked fish. Whole eggs or egg yolks. Chicken or turkey with skin. Dairy Whole or 2% milk, cream, and half-and-half. Whole or full-fat cream cheese. Whole-fat or sweetened yogurt. Full-fat cheese. Nondairy creamers. Whipped toppings.  Processed cheese and cheese spreads. Fats and oils Butter. Stick margarine. Lard. Shortening. Ghee. Bacon fat. Tropical oils, such as coconut, palm kernel, or palm oil. Seasoning and other foods Salted popcorn and pretzels. Onion salt, garlic salt, seasoned salt, table salt, and sea salt. Worcestershire sauce. Tartar sauce. Barbecue sauce. Teriyaki sauce. Soy sauce, including reduced-sodium. Steak sauce. Canned and packaged gravies. Fish sauce. Oyster sauce. Cocktail sauce. Horseradish that you find on the shelf. Ketchup. Mustard. Meat flavorings and tenderizers. Bouillon cubes. Hot sauce and Tabasco sauce. Premade or packaged marinades. Premade or packaged taco seasonings. Relishes. Regular salad dressings. Where to find more information:  National Heart, Lung, and Blood Institute: www.nhlbi.nih.gov  American Heart Association: www.heart.org Summary  The DASH eating plan is a healthy eating plan that has been shown to reduce high blood pressure (hypertension). It may also reduce your risk for type 2 diabetes, heart disease, and stroke.  With the DASH eating plan, you should limit salt (sodium) intake to 2,300 mg a day. If you have hypertension, you may need to reduce your sodium intake to 1,500 mg a day.  When on the DASH eating plan, aim to eat more fresh fruits and vegetables, whole grains, lean proteins, low-fat dairy, and heart-healthy fats.  Work with your health care provider or diet and nutrition specialist (dietitian) to adjust your eating plan to your   individual calorie needs. This information is not intended to replace advice given to you by your health care provider. Make sure you discuss any questions you have with your health care provider. Document Revised: 11/10/2017 Document Reviewed: 11/21/2016 Elsevier Patient Education  2020 Elsevier Inc.  

## 2020-03-10 NOTE — Progress Notes (Signed)
BP 129/72   Pulse (!) 57   Temp 98.1 F (36.7 C) (Oral)   Wt 165 lb (74.8 kg)   LMP  (LMP Unknown)   SpO2 98%   BMI 29.46 kg/m    Subjective:    Patient ID: Cynthia Hurst, female    DOB: [birth year], 85 y.o.   MRN: [id number]  HPI: Cynthia Hurst is a 85 y.o. female  Chief Complaint  Patient presents with  . Hypertension  . Hyperlipidemia  . Chronic Kidney Disease  . Gout   HYPERTENSION Continues on Nifedipine and Atenolol.  Satisfied with current treatment? yes Duration of hypertension: chronic BP monitoring frequency: monthly BP range: 130/70 range at home BP medication side effects: no Aspirin: no Recent stressors: no Recurrent headaches: no Visual changes: no Palpitations: no Dyspnea: no Chest pain: no Lower extremity edema: no Dizzy/lightheaded: no   CHRONIC KIDNEY DISEASE Last kidney function in February 2021 -- CRT 1.01 and GFR 60.   CKD status: controlled Medications renally dose: yes Previous renal evaluation: no Pneumovax:  refused Influenza Vaccine:  refused  GOUT Continues on Allopurinol 300 MG.  Had a flare once, many years ago.  In right foot. Duration:chronic Swelling: no Redness: no Trauma: no Recent dietary change or indiscretion: no Fevers: no Nausea/vomiting: no Aggravating factors: Alleviating factors:  Status:  stable Treatments attempted:  MGUS: Saw Dr. Tasia Catchings on 02/10/2020.  Had renal ultrasound on 02/14/2020 with overall normal findings, right-sided renal cysts measuring 1.8 cm and questionable mild bladder wall thickening with recommendation for correlation with urinalysis. She is to follow-up with oncology in 6 months.  Relevant past medical, surgical, family and social history reviewed and updated as indicated. Interim medical history since our last visit reviewed. Allergies and medications reviewed and updated.  Review of Systems  Constitutional: Negative for activity change, appetite change, diaphoresis, fatigue and fever.   Respiratory: Negative for cough, chest tightness and shortness of breath.   Cardiovascular: Negative for chest pain, palpitations and leg swelling.  Gastrointestinal: Negative.   Musculoskeletal: Negative for back pain.  Neurological: Negative.   Psychiatric/Behavioral: Negative.     Per HPI unless specifically indicated above     Objective:    BP 129/72   Pulse (!) 57   Temp 98.1 F (36.7 C) (Oral)   Wt 165 lb (74.8 kg)   LMP  (LMP Unknown)   SpO2 98%   BMI 29.46 kg/m   Wt Readings from Last 3 Encounters:  03/10/20 165 lb (74.8 kg)  02/10/20 167 lb 11.2 oz (76.1 kg)  02/07/20 167 lb 9.6 oz (76 kg)    Physical Exam Vitals and nursing note reviewed.  Constitutional:      General: She is awake. She is not in acute distress.    Appearance: She is well-developed and well-groomed. She is not ill-appearing.  HENT:     Head: Normocephalic.     Right Ear: Hearing normal.     Left Ear: Hearing normal.  Eyes:     General: Lids are normal.        Right eye: No discharge.        Left eye: No discharge.     Conjunctiva/sclera: Conjunctivae normal.     Pupils: Pupils are equal, round, and reactive to light.  Neck:     Thyroid: No thyromegaly.     Vascular: No carotid bruit.  Cardiovascular:     Rate and Rhythm: Normal rate and regular rhythm.     Heart sounds: Normal  heart sounds. No murmur. No gallop.   Pulmonary:     Effort: Pulmonary effort is normal. No accessory muscle usage or respiratory distress.     Breath sounds: Normal breath sounds.  Abdominal:     General: Bowel sounds are normal.     Palpations: Abdomen is soft.     Tenderness: There is no abdominal tenderness.  Musculoskeletal:     Cervical back: Normal range of motion and neck supple.     Right lower leg: No edema.     Left lower leg: No edema.     Comments: Kyphosis present.  Skin:    General: Skin is warm and dry.  Neurological:     Mental Status: She is alert and oriented to person, place, and  time.  Psychiatric:        Attention and Perception: Attention normal.        Mood and Affect: Mood normal.        Speech: Speech normal.        Behavior: Behavior normal. Behavior is cooperative.        Thought Content: Thought content normal.     Results for orders placed or performed in visit on 02/07/20  UA/M w/rflx Culture, Routine   Specimen: Urine   URINE  Result Value Ref Range   Specific Gravity, UA 1.020 1.005 - 1.030   pH, UA 5.0 5.0 - 7.5   Color, UA Yellow Yellow   Appearance Ur Clear Clear   Leukocytes,UA Negative Negative   Protein,UA Negative Negative/Trace   Glucose, UA Negative Negative   Ketones, UA Negative Negative   RBC, UA Negative Negative   Bilirubin, UA Negative Negative   Urobilinogen, Ur 0.2 0.2 - 1.0 mg/dL   Nitrite, UA Negative Negative      Assessment & Plan:   Problem List Items Addressed This Visit      Cardiovascular and Mediastinum   Benign hypertension with chronic kidney disease    Chronic, ongoing.  BP at goal at home and in office today.  Continue current medication regimen and adjust as needed.  BMP today.  Renal dose medications as needed.        Genitourinary   CKD (chronic kidney disease), stage III    Chronic with CKD 3.  Monitor BMP regularly and refer to nephrology as needed if worsening CKD.  Renal dose medications as needed, consider reduction Allopurinol if GFR <60.  Obtain urinalysis today to further assess, due to recent renal u/s findings of questionable bladder wall thickening.      Relevant Orders   Basic metabolic panel   UA/M w/rflx Culture, Routine     Other   Gout    Chronic, stable.  Continue Allopurinol at current dose, but if GFR <60 consider reduction to 100 MG daily.  Check uric acid today.  No flare in years.      Relevant Orders   Uric acid   MGUS (monoclonal gammopathy of unknown significance) - Primary    Chronic, ongoing.  Continue collaboration with oncology.          Follow up  plan: Return in about 6 months (around 09/10/2020) for MGUS, HTN, GERD, CKD, Vit B12 and D.

## 2020-03-11 ENCOUNTER — Other Ambulatory Visit: Payer: Self-pay | Admitting: Nurse Practitioner

## 2020-03-11 LAB — BASIC METABOLIC PANEL
BUN/Creatinine Ratio: 14 (ref 12–28)
BUN: 15 mg/dL (ref 8–27)
CO2: 24 mmol/L (ref 20–29)
Calcium: 9.9 mg/dL (ref 8.7–10.3)
Chloride: 107 mmol/L — ABNORMAL HIGH (ref 96–106)
Creatinine, Ser: 1.05 mg/dL — ABNORMAL HIGH (ref 0.57–1.00)
GFR calc Af Amer: 57 mL/min/{1.73_m2} — ABNORMAL LOW (ref >59)
GFR calc non Af Amer: 49 mL/min/{1.73_m2} — ABNORMAL LOW (ref >59)
Glucose: 78 mg/dL (ref 65–99)
Potassium: 4.2 mmol/L (ref 3.5–5.2)
Sodium: 145 mmol/L — ABNORMAL HIGH (ref 134–144)

## 2020-03-11 LAB — URIC ACID: Uric Acid: 2.7 mg/dL — ABNORMAL LOW (ref 3.1–7.9)

## 2020-03-11 MED ORDER — ALLOPURINOL 100 MG PO TABS: 100.0000 mg | ORAL_TABLET | Freq: Every day | ORAL | 3 refills | Status: DC

## 2020-03-11 NOTE — Progress Notes (Signed)
Please let Cynthia Hurst know her labs have returned.  They continue to show stable kidney disease, but GFR (one of the kidney labs we look at for dosing medications) is under 60, I would like to decrease her Allopurinol to 100 MG daily and may lower this further in future.  Stop taking Allopurinol 300 MG.  Her uric acid, which we look at for gout, is stable and nice and low, so this change should not cause an issue.  I will send in this prescription today.  Her sodium was a little high, I do recommend increasing water intake during day time hours.  Will recheck this next visit.  If any questions let me know.  Have a great day!!

## 2020-03-27 ENCOUNTER — Other Ambulatory Visit: Payer: Self-pay | Admitting: Nurse Practitioner

## 2020-03-27 ENCOUNTER — Other Ambulatory Visit: Payer: Self-pay | Admitting: Unknown Physician Specialty

## 2020-03-27 MED ORDER — ALLOPURINOL 100 MG PO TABS: 100.0000 mg | ORAL_TABLET | Freq: Every day | ORAL | 3 refills | Status: DC

## 2020-03-27 NOTE — Telephone Encounter (Signed)
Requested  medications are  due for refill today yes (mail order)  Requested medications are on the active medication list NO  Last refill 2/8  Notes to clinic Different dose on current med list

## 2020-03-27 NOTE — Telephone Encounter (Signed)
Routing to provider  

## 2020-04-19 ENCOUNTER — Other Ambulatory Visit: Payer: Self-pay | Admitting: Unknown Physician Specialty

## 2020-04-19 NOTE — Telephone Encounter (Signed)
Requested Prescriptions  Pending Prescriptions Disp Refills  . atenolol (TENORMIN) 100 MG tablet [Pharmacy Med Name: ATENOLOL TABS 100MG ] 90 tablet 1    Sig: TAKE 1 TABLET DAILY     Cardiovascular:  Beta Blockers Passed - 04/19/2020  5:58 AM      Passed - Last BP in normal range    BP Readings from Last 1 Encounters:  03/10/20 129/72         Passed - Last Heart Rate in normal range    Pulse Readings from Last 1 Encounters:  03/10/20 (!) 57         Passed - Valid encounter within last 6 months    Recent Outpatient Visits          1 month ago MGUS (monoclonal gammopathy of unknown significance)   [place], [place] T, NP   2 months ago Chronic left-sided low back pain without sciatica   [facility] Carnella [place] I, NP   7 months ago CKD (chronic kidney disease), stage III ([place])   [place] [name], [name] T, NP   8 months ago Scoliosis of thoracolumbar spine, unspecified scoliosis type   [place], [name] T, NP   9 months ago Acute left-sided thoracic back pain   [place], Barbaraann Faster, NP      Future Appointments            In 4 months [name], Barbaraann Faster, NP [facility]

## 2020-05-24 ENCOUNTER — Emergency Department: Payer: Medicare Other

## 2020-05-24 ENCOUNTER — Other Ambulatory Visit: Payer: Self-pay | Admitting: Nurse Practitioner

## 2020-05-24 DIAGNOSIS — Z5321 Procedure and treatment not carried out due to patient leaving prior to being seen by health care provider: Secondary | ICD-10-CM | POA: Insufficient documentation

## 2020-05-24 DIAGNOSIS — R0789 Other chest pain: Secondary | ICD-10-CM | POA: Diagnosis present

## 2020-05-24 LAB — COMPREHENSIVE METABOLIC PANEL
ALT: 10 U/L (ref 0–44)
AST: 19 U/L (ref 15–41)
Albumin: 3.9 g/dL (ref 3.5–5.0)
Alkaline Phosphatase: 102 U/L (ref 38–126)
Anion gap: 6 (ref 5–15)
BUN: 22 mg/dL (ref 8–23)
CO2: 28 mmol/L (ref 22–32)
Calcium: 9.5 mg/dL (ref 8.9–10.3)
Chloride: 106 mmol/L (ref 98–111)
Creatinine, Ser: 1.11 mg/dL — ABNORMAL HIGH (ref 0.44–1.00)
GFR calc Af Amer: 53 mL/min — ABNORMAL LOW (ref >60)
GFR calc non Af Amer: 46 mL/min — ABNORMAL LOW (ref >60)
Glucose, Bld: 96 mg/dL (ref 70–99)
Potassium: 4.6 mmol/L (ref 3.5–5.1)
Sodium: 140 mmol/L (ref 135–145)
Total Bilirubin: 0.6 mg/dL (ref 0.3–1.2)
Total Protein: 7.1 g/dL (ref 6.5–8.1)

## 2020-05-24 LAB — CBC
HCT: 36.1 % (ref 36.0–46.0)
Hemoglobin: 12.1 g/dL (ref 12.0–15.0)
MCH: 31 pg (ref 26.0–34.0)
MCHC: 33.5 g/dL (ref 30.0–36.0)
MCV: 92.6 fL (ref 80.0–100.0)
Platelets: 160 10*3/uL (ref 150–400)
RBC: 3.9 MIL/uL (ref 3.87–5.11)
RDW: 14.1 % (ref 11.5–15.5)
WBC: 7.3 10*3/uL (ref 4.0–10.5)
nRBC: 0 % (ref 0.0–0.2)

## 2020-05-24 LAB — TROPONIN I (HIGH SENSITIVITY): Troponin I (High Sensitivity): 6 ng/L (ref <18)

## 2020-05-24 NOTE — Telephone Encounter (Signed)
Requested Prescriptions  Pending Prescriptions Disp Refills  . KLOR-CON M20 20 MEQ tablet [Pharmacy Med Name: POTASSIUM CHLORIDE ER (DISP) TABS 20MEQ] 90 tablet 3    Sig: TAKE 1 TABLET DAILY     Endocrinology:  Minerals - Potassium Supplementation Failed - 05/24/2020  5:47 AM      Failed - Cr in normal range and within 360 days    Creatinine, Ser  Date Value Ref Range Status  03/10/2020 1.05 (H) 0.57 - 1.00 mg/dL Final         Passed - K in normal range and within 360 days    Potassium  Date Value Ref Range Status  03/10/2020 4.2 3.5 - 5.2 mmol/L Final         Passed - Valid encounter within last 12 months    Recent Outpatient Visits          2 months ago MGUS (monoclonal gammopathy of unknown significance)   [place], [name] T, NP   3 months ago Chronic left-sided low back pain without sciatica   [place], NP   8 months ago CKD (chronic kidney disease), stage III ([place])   [place] [name], [name] T, NP   9 months ago Scoliosis of thoracolumbar spine, unspecified scoliosis type   [place], [name] T, NP   10 months ago Acute left-sided thoracic back pain   [place], Barbaraann Faster, NP      Future Appointments            In 3 months [name], Barbaraann Faster, NP [facility]

## 2020-05-24 NOTE — ED Triage Notes (Signed)
Patient to ED with complaints of left sided rib pain. Denies CP, shortness of breath, N/V. Denies falling or other injury. Onset of pain yesterday. Worse today.

## 2020-05-25 ENCOUNTER — Emergency Department
Admission: EM | Admit: 2020-05-25 | Discharge: 2020-05-25 | Disposition: A | Discharge status: Home / Self Care | Payer: Medicare Other | Attending: Emergency Medicine | Admitting: Emergency Medicine

## 2020-05-25 DIAGNOSIS — M47816 Spondylosis without myelopathy or radiculopathy, lumbar region: Secondary | ICD-10-CM | POA: Diagnosis not present

## 2020-05-25 DIAGNOSIS — R0789 Other chest pain: Secondary | ICD-10-CM | POA: Diagnosis not present

## 2020-05-25 DIAGNOSIS — M5136 Other intervertebral disc degeneration, lumbar region: Secondary | ICD-10-CM | POA: Diagnosis not present

## 2020-05-25 LAB — TROPONIN I (HIGH SENSITIVITY): Troponin I (High Sensitivity): 7 ng/L (ref <18)

## 2020-05-25 NOTE — ED Notes (Signed)
Patient has decided to leave as she is growing tired. Lengthy conversation with patient and her son regarding immediate return should she have return of symptoms or other immediate concerns.

## 2020-05-28 ENCOUNTER — Other Ambulatory Visit: Payer: Self-pay

## 2020-05-28 ENCOUNTER — Encounter: Payer: Self-pay | Admitting: Nurse Practitioner

## 2020-05-28 ENCOUNTER — Ambulatory Visit (INDEPENDENT_AMBULATORY_CARE_PROVIDER_SITE_OTHER): Payer: Medicare Other | Admitting: Nurse Practitioner

## 2020-05-28 VITALS — BP 107/63 | HR 60 | Temp 97.8°F | Wt 159.4 lb

## 2020-05-28 DIAGNOSIS — G8929 Other chronic pain: Secondary | ICD-10-CM | POA: Diagnosis not present

## 2020-05-28 DIAGNOSIS — M419 Scoliosis, unspecified: Secondary | ICD-10-CM

## 2020-05-28 DIAGNOSIS — M545 Low back pain: Secondary | ICD-10-CM

## 2020-05-28 DIAGNOSIS — R109 Unspecified abdominal pain: Secondary | ICD-10-CM | POA: Diagnosis not present

## 2020-05-28 LAB — UA/M W/RFLX CULTURE, ROUTINE
Bilirubin, UA: NEGATIVE
Glucose, UA: NEGATIVE
Ketones, UA: NEGATIVE
Leukocytes,UA: NEGATIVE
Nitrite, UA: NEGATIVE
Protein,UA: NEGATIVE
RBC, UA: NEGATIVE
Specific Gravity, UA: 1.01 (ref 1.005–1.030)
Urobilinogen, Ur: 0.2 mg/dL (ref 0.2–1.0)
pH, UA: 6 (ref 5.0–7.5)

## 2020-05-28 MED ORDER — TIZANIDINE HCL 2 MG PO TABS: 2.0000 mg | ORAL_TABLET | Freq: Four times a day (QID) | ORAL | 0 refills | Status: DC | PRN

## 2020-05-28 NOTE — Progress Notes (Signed)
BP 107/63 (BP Location: Left Arm, Patient Position: Sitting, Cuff Size: Normal)   Pulse 60   Temp 97.8 F (36.6 C) (Oral)   Wt 159 lb 6.4 oz (72.3 kg)   LMP  (LMP Unknown)   SpO2 91%   BMI 31.13 kg/m    Subjective:    Patient ID: Cynthia Hurst, female    DOB: [birth year], 85 y.o.   MRN: [id number]  HPI: Cynthia Hurst is a 85 y.o. female  Chief Complaint  Patient presents with  . er followup   ER FOLLOW UP  Was in ER 05/24/2020 for left flank pain, but left before being seen due to wait time.  Did have labs obtained noting CRT 1.11 and GFR 53, CBC unremarkable.  Had chest x-ray which showed known scoliosis and no explanation for left back pain.  At this time she reports pain has improved, no pain today.  She denies any dysuria, frequency, urgency, or hematuria.  No N&V.   Time since discharge: 4 days ago Hospital/facility: [facility] Diagnosis: left flank pain Procedures/tests: CBC, CMP, chest x-ray Consultants: none New medications: none Discharge instructions:  Follow-up PCP Status: better  Relevant past medical, surgical, family and social history reviewed and updated as indicated. Interim medical history since our last visit reviewed. Allergies and medications reviewed and updated.  Review of Systems  Constitutional: Negative.   Respiratory: Negative for cough, chest tightness, shortness of breath and wheezing.   Cardiovascular: Negative for chest pain, palpitations and leg swelling.  Gastrointestinal: Negative.   Genitourinary: Negative.   Musculoskeletal: Negative for arthralgias.  Psychiatric/Behavioral: Negative.     Per HPI unless specifically indicated above     Objective:    BP 107/63 (BP Location: Left Arm, Patient Position: Sitting, Cuff Size: Normal)   Pulse 60   Temp 97.8 F (36.6 C) (Oral)   Wt 159 lb 6.4 oz (72.3 kg)   LMP  (LMP Unknown)   SpO2 91%   BMI 31.13 kg/m   Wt Readings from Last 3 Encounters:  05/28/20 159 lb 6.4 oz (72.3 kg)    05/24/20 165 lb (74.8 kg)  03/10/20 165 lb (74.8 kg)    Physical Exam Vitals and nursing note reviewed.  Constitutional:      General: She is awake. She is not in acute distress.    Appearance: She is well-developed. She is obese. She is not ill-appearing.  HENT:     Head: Normocephalic.     Right Ear: Hearing normal.     Left Ear: Hearing normal.     Nose: Nose normal.     Mouth/Throat:     Mouth: Mucous membranes are moist.  Eyes:     General: Lids are normal.        Right eye: No discharge.        Left eye: No discharge.     Conjunctiva/sclera: Conjunctivae normal.     Pupils: Pupils are equal, round, and reactive to light.  Cardiovascular:     Rate and Rhythm: Normal rate and regular rhythm.     Heart sounds: Normal heart sounds. No murmur heard.  No gallop.   Pulmonary:     Effort: Pulmonary effort is normal. No accessory muscle usage or respiratory distress.     Breath sounds: Normal breath sounds.  Abdominal:     General: Bowel sounds are normal.     Palpations: Abdomen is soft. There is no hepatomegaly or splenomegaly.  Musculoskeletal:     Cervical back: Normal  range of motion and neck supple.     Thoracic back: Tenderness present. No swelling, edema, lacerations, spasms or bony tenderness. Normal range of motion.     Lumbar back: No swelling, edema, lacerations, spasms, tenderness or bony tenderness. Normal range of motion.       Back:     Right lower leg: No edema.     Left lower leg: No edema.     Comments: Kyphosis noted.  Skin:    General: Skin is warm and dry.  Neurological:     Mental Status: She is alert and oriented to person, place, and time.  Psychiatric:        Attention and Perception: Attention normal.        Mood and Affect: Mood normal.        Behavior: Behavior normal. Behavior is cooperative.        Thought Content: Thought content normal.        Judgment: Judgment normal.     Results for orders placed or performed during the hospital  encounter of 05/25/20  CBC  Result Value Ref Range   WBC 7.3 4.0 - 10.5 K/uL   RBC 3.90 3.87 - 5.11 MIL/uL   Hemoglobin 12.1 12.0 - 15.0 g/dL   HCT 36.1 36 - 46 %   MCV 92.6 80.0 - 100.0 fL   MCH 31.0 26.0 - 34.0 pg   MCHC 33.5 30.0 - 36.0 g/dL   RDW 14.1 11.5 - 15.5 %   Platelets 160 150 - 400 K/uL   nRBC 0.0 0.0 - 0.2 %  Comprehensive metabolic panel  Result Value Ref Range   Sodium 140 135 - 145 mmol/L   Potassium 4.6 3.5 - 5.1 mmol/L   Chloride 106 98 - 111 mmol/L   CO2 28 22 - 32 mmol/L   Glucose, Bld 96 70 - 99 mg/dL   BUN 22 8 - 23 mg/dL   Creatinine, Ser 1.11 (H) 0.44 - 1.00 mg/dL   Calcium 9.5 8.9 - 10.3 mg/dL   Total Protein 7.1 6.5 - 8.1 g/dL   Albumin 3.9 3.5 - 5.0 g/dL   AST 19 15 - 41 U/L   ALT 10 0 - 44 U/L   Alkaline Phosphatase 102 38 - 126 U/L   Total Bilirubin 0.6 0.3 - 1.2 mg/dL   GFR calc non Af Amer 46 (L) >60 mL/min   GFR calc Af Amer 53 (L) >60 mL/min   Anion gap 6 5 - 15  Troponin I (High Sensitivity)  Result Value Ref Range   Troponin I (High Sensitivity) 6 <18 ng/L  Troponin I (High Sensitivity)  Result Value Ref Range   Troponin I (High Sensitivity) 7 <18 ng/L      Assessment & Plan:   Problem List Items Addressed This Visit      Musculoskeletal and Integument   Scoliosis    Recommend continued stretches and exercises at home as instructed by PT.  Continue Voltaren gel and heat as needed.  Return to office if pain returns or worsens.  May need to return to PT.        Other   Chronic left-sided low back pain without sciatica - Primary    Ongoing with recent flare that is now improved.  UA in office today negative.  Suspect musculoskeletal in nature, has had flares in past and has moderate scoliosis and kyphosis.  Will send in script for Tizanidine to use as needed, educated her on this, + educated her  on Tylenol may use this as needed (max 3000 MG total a day). Continue to perform PT stretches at home to help with strengthening.  Return  to office for worsening or ongoing pain, could consider adding on Lidocaine 5% patches if ongoing flares and return to PT.      Relevant Medications   tiZANidine (ZANAFLEX) 2 MG tablet   Other Relevant Orders   UA/M w/rflx Culture, Routine       Follow up plan: Return if symptoms worsen or fail to improve.

## 2020-05-28 NOTE — Assessment & Plan Note (Signed)
Ongoing with recent flare that is now improved.  UA in office today negative.  Suspect musculoskeletal in nature, has had flares in past and has moderate scoliosis and kyphosis.  Will send in script for Tizanidine to use as needed, educated her on this, + educated her on Tylenol may use this as needed (max 3000 MG total a day). Continue to perform PT stretches at home to help with strengthening.  Return to office for worsening or ongoing pain, could consider adding on Lidocaine 5% patches if ongoing flares and return to PT.

## 2020-05-28 NOTE — Assessment & Plan Note (Signed)
Recommend continued stretches and exercises at home as instructed by PT.  Continue Voltaren gel and heat as needed.  Return to office if pain returns or worsens.  May need to return to PT.

## 2020-05-28 NOTE — Patient Instructions (Signed)
Store -- [employer]  Tylenol can take up to a max of 3000 MG total a day -- could take 1000 MG three times a day if needed for discomfort    Chronic Back Pain When back pain lasts longer than 3 months, it is called chronic back pain. Pain may get worse at certain times (flare-ups). There are things you can do at home to manage your pain. Follow these instructions at home: Activity      Avoid bending and other activities that make pain worse.  When standing: ? Keep your upper back and neck straight. ? Keep your shoulders pulled back. ? Avoid slouching.  When sitting: ? Keep your back straight. ? Relax your shoulders. Do not round your shoulders or pull them backward.  Do not sit or stand in one place for long periods of time.  Take short rest breaks during the day. Lying down or standing is usually better than sitting. Resting can help relieve pain.  When sitting or lying down for a long time, do some mild activity or stretching. This will help to prevent stiffness and pain.  Get regular exercise. Ask your doctor what activities are safe for you.  Do not lift anything that is heavier than 10 lb (4.5 kg). To prevent injury when you lift things: ? Bend your knees. ? Keep the weight close to your body. ? Avoid twisting. Managing pain  If told, put ice on the painful area. Your doctor may tell you to use ice for 24-48 hours after a flare-up starts. ? Put ice in a plastic bag. ? Place a towel between your skin and the bag. ? Leave the ice on for 20 minutes, 2-3 times a day.  If told, put heat on the painful area as often as told by your doctor. Use the heat source that your doctor recommends, such as a moist heat pack or a heating pad. ? Place a towel between your skin and the heat source. ? Leave the heat on for 20-30 minutes. ? Remove the heat if your skin turns bright red. This is especially important if you are unable to feel pain, heat, or cold. You may have a greater  risk of getting burned.  Soak in a warm bath. This can help relieve pain.  Take over-the-counter and prescription medicines only as told by your doctor. General instructions  Sleep on a firm mattress. Try lying on your side with your knees slightly bent. If you lie on your back, put a pillow under your knees.  Keep all follow-up visits as told by your doctor. This is important. Contact a doctor if:  You have pain that does not get better with rest or medicine. Get help right away if:  One or both of your arms or legs feel weak.  One or both of your arms or legs lose feeling (numbness).  You have trouble controlling when you poop (bowel movement) or pee (urinate).  You feel sick to your stomach (nauseous).  You throw up (vomit).  You have belly (abdominal) pain.  You have shortness of breath.  You pass out (faint). Summary  When back pain lasts longer than 3 months, it is called chronic back pain.  Pain may get worse at certain times (flare-ups).  Use ice and heat as told by your doctor. Your doctor may tell you to use ice after flare-ups. This information is not intended to replace advice given to you by your health care provider. Make sure you discuss  any questions you have with your health care provider. Document Revised: 03/21/2019 Document Reviewed: 07/13/2017 Elsevier Patient Education  2020 [employer].

## 2020-07-21 ENCOUNTER — Other Ambulatory Visit: Payer: Self-pay | Admitting: Unknown Physician Specialty

## 2020-07-25 ENCOUNTER — Other Ambulatory Visit: Payer: Self-pay | Admitting: Nurse Practitioner

## 2020-07-27 ENCOUNTER — Ambulatory Visit (INDEPENDENT_AMBULATORY_CARE_PROVIDER_SITE_OTHER): Payer: Medicare Other

## 2020-07-27 VITALS — Ht 65.0 in | Wt 160.0 lb

## 2020-07-27 DIAGNOSIS — Z0000 Encounter for general adult medical examination without abnormal findings: Secondary | ICD-10-CM | POA: Diagnosis not present

## 2020-07-27 NOTE — Patient Instructions (Signed)
Cynthia Hurst , Thank you for taking time to come for your Medicare Wellness Visit. I appreciate your ongoing commitment to your health goals. Please review the following plan we discussed and let me know if I can assist you in the future.   Screening recommendations/referrals: Colonoscopy: not required Mammogram: not required Bone Density: completed 05/25/2010 Recommended yearly ophthalmology/optometry visit for glaucoma screening and checkup Recommended yearly dental visit for hygiene and checkup  Vaccinations: Influenza vaccine: decline Pneumococcal vaccine: decline Tdap vaccine: decline Shingles vaccine: discussed   Covid-19:01/24/2020, 02/21/2020  Advanced directives: Advance directive discussed with you today  Conditions/risks identified: none  Next appointment: Follow up in one year for your annual wellness visit    Preventive Care 83 Years and Older, Female Preventive care refers to lifestyle choices and visits with your health care provider that can promote health and wellness. What does preventive care include?  A yearly physical exam. This is also called an annual well check.  Dental exams once or twice a year.  Routine eye exams. Ask your health care provider how often you should have your eyes checked.  Personal lifestyle choices, including:  Daily care of your teeth and gums.  Regular physical activity.  Eating a healthy diet.  Avoiding tobacco and drug use.  Limiting alcohol use.  Practicing safe sex.  Taking low-dose aspirin every day.  Taking vitamin and mineral supplements as recommended by your health care provider. What happens during an annual well check? The services and screenings done by your health care provider during your annual well check will depend on your age, overall health, lifestyle risk factors, and family history of disease. Counseling  Your health care provider may ask you questions about your:  Alcohol use.  Tobacco use.  Drug  use.  Emotional well-being.  Home and relationship well-being.  Sexual activity.  Eating habits.  History of falls.  Memory and ability to understand (cognition).  Work and work Statistician.  Reproductive health. Screening  You may have the following tests or measurements:  Height, weight, and BMI.  Blood pressure.  Lipid and cholesterol levels. These may be checked every 5 years, or more frequently if you are over 25 years old.  Skin check.  Lung cancer screening. You may have this screening every year starting at age 64 if you have a 30-pack-year history of smoking and currently smoke or have quit within the past 15 years.  Fecal occult blood test (FOBT) of the stool. You may have this test every year starting at age 50.  Flexible sigmoidoscopy or colonoscopy. You may have a sigmoidoscopy every 5 years or a colonoscopy every 10 years starting at age 21.  Hepatitis C blood test.  Hepatitis B blood test.  Sexually transmitted disease (STD) testing.  Diabetes screening. This is done by checking your blood sugar (glucose) after you have not eaten for a while (fasting). You may have this done every 1-3 years.  Bone density scan. This is done to screen for osteoporosis. You may have this done starting at age 83.  Mammogram. This may be done every 1-2 years. Talk to your health care provider about how often you should have regular mammograms. Talk with your health care provider about your test results, treatment options, and if necessary, the need for more tests. Vaccines  Your health care provider may recommend certain vaccines, such as:  Influenza vaccine. This is recommended every year.  Tetanus, diphtheria, and acellular pertussis (Tdap, Td) vaccine. You may need a Td booster  every 10 years.  Zoster vaccine. You may need this after age 71.  Pneumococcal 13-valent conjugate (PCV13) vaccine. One dose is recommended after age 64.  Pneumococcal polysaccharide  (PPSV23) vaccine. One dose is recommended after age 60. Talk to your health care provider about which screenings and vaccines you need and how often you need them. This information is not intended to replace advice given to you by your health care provider. Make sure you discuss any questions you have with your health care provider. Document Released: 12/25/2015 Document Revised: 08/17/2016 Document Reviewed: 09/29/2015 Elsevier Interactive Patient Education  2017 [employer] Prevention in the Home Falls can cause injuries. They can happen to people of all ages. There are many things you can do to make your home safe and to help prevent falls. What can I do on the outside of my home?  Regularly fix the edges of walkways and driveways and fix any cracks.  Remove anything that might make you trip as you walk through a door, such as a raised step or threshold.  Trim any bushes or trees on the path to your home.  Use bright outdoor lighting.  Clear any walking paths of anything that might make someone trip, such as rocks or tools.  Regularly check to see if handrails are loose or broken. Make sure that both sides of any steps have handrails.  Any raised decks and porches should have guardrails on the edges.  Have any leaves, snow, or ice cleared regularly.  Use sand or salt on walking paths during winter.  Clean up any spills in your garage right away. This includes oil or grease spills. What can I do in the bathroom?  Use night lights.  Install grab bars by the toilet and in the tub and shower. Do not use towel bars as grab bars.  Use non-skid mats or decals in the tub or shower.  If you need to sit down in the shower, use a plastic, non-slip stool.  Keep the floor dry. Clean up any water that spills on the floor as soon as it happens.  Remove soap buildup in the tub or shower regularly.  Attach bath mats securely with double-sided non-slip rug tape.  Do not have  throw rugs and other things on the floor that can make you trip. What can I do in the bedroom?  Use night lights.  Make sure that you have a light by your bed that is easy to reach.  Do not use any sheets or blankets that are too big for your bed. They should not hang down onto the floor.  Have a firm chair that has side arms. You can use this for support while you get dressed.  Do not have throw rugs and other things on the floor that can make you trip. What can I do in the kitchen?  Clean up any spills right away.  Avoid walking on wet floors.  Keep items that you use a lot in easy-to-reach places.  If you need to reach something above you, use a strong step stool that has a grab bar.  Keep electrical cords out of the way.  Do not use floor polish or wax that makes floors slippery. If you must use wax, use non-skid floor wax.  Do not have throw rugs and other things on the floor that can make you trip. What can I do with my stairs?  Do not leave any items on the stairs.  Make  sure that there are handrails on both sides of the stairs and use them. Fix handrails that are broken or loose. Make sure that handrails are as long as the stairways.  Check any carpeting to make sure that it is firmly attached to the stairs. Fix any carpet that is loose or worn.  Avoid having throw rugs at the top or bottom of the stairs. If you do have throw rugs, attach them to the floor with carpet tape.  Make sure that you have a light switch at the top of the stairs and the bottom of the stairs. If you do not have them, ask someone to add them for you. What else can I do to help prevent falls?  Wear shoes that:  Do not have high heels.  Have rubber bottoms.  Are comfortable and fit you well.  Are closed at the toe. Do not wear sandals.  If you use a stepladder:  Make sure that it is fully opened. Do not climb a closed stepladder.  Make sure that both sides of the stepladder are  locked into place.  Ask someone to hold it for you, if possible.  Clearly mark and make sure that you can see:  Any grab bars or handrails.  First and last steps.  Where the edge of each step is.  Use tools that help you move around (mobility aids) if they are needed. These include:  Canes.  Walkers.  Scooters.  Crutches.  Turn on the lights when you go into a dark area. Replace any light bulbs as soon as they burn out.  Set up your furniture so you have a clear path. Avoid moving your furniture around.  If any of your floors are uneven, fix them.  If there are any pets around you, be aware of where they are.  Review your medicines with your doctor. Some medicines can make you feel dizzy. This can increase your chance of falling. Ask your doctor what other things that you can do to help prevent falls. This information is not intended to replace advice given to you by your health care provider. Make sure you discuss any questions you have with your health care provider. Document Released: 09/24/2009 Document Revised: 05/05/2016 Document Reviewed: 01/02/2015 Elsevier Interactive Patient Education  2017 [employer].

## 2020-07-27 NOTE — Progress Notes (Signed)
I connected with [name] today by telephone and verified that I am speaking with the correct person using two identifiers. Location patient: home Location provider: work Persons participating in the virtual visit: Lisl, [place] LPN.   I discussed the limitations, risks, security and privacy concerns of performing an evaluation and management service by telephone and the availability of in person appointments. I also discussed with the patient that there may be a patient responsible charge related to this service. The patient expressed understanding and verbally consented to this telephonic visit.    Interactive audio and video telecommunications were attempted between this provider and patient, however failed, due to patient having technical difficulties OR patient did not have access to video capability.  We continued and completed visit with audio only.    Vital signs may be patient reported or missing.   Subjective:   Cynthia Hurst is a 85 y.o. female who presents for Medicare Annual (Subsequent) preventive examination.  Review of Systems     Cardiac Risk Factors include: advanced age (>26men, >7 women);hypertension;sedentary lifestyle     Objective:    Today's Vitals   07/27/20 1111  Weight: 160 lb (72.6 kg)  Height: 5\' 5"  (1.651 m)   Body mass index is 26.63 kg/m.  Advanced Directives 07/27/2020 02/07/2020 08/08/2019 07/03/2019 02/07/2019 10/29/2018 06/27/2018  Does Patient Have a Medical Advance Directive? No No No No No No No  Would patient like information on creating a medical advance directive? - - - No - Patient declined No - Patient declined - No - Patient declined    Current Medications (verified) Outpatient Encounter Medications as of 07/27/2020  Medication Sig  . allopurinol (ZYLOPRIM) 100 MG tablet Take 1 tablet (100 mg total) by mouth daily.  Marland Kitchen atenolol (TENORMIN) 100 MG tablet TAKE 1 TABLET DAILY  . cholecalciferol (VITAMIN D) 1000 UNITS  tablet Take 1,000 Units by mouth daily.  . cyanocobalamin 2000 MCG tablet Take 2,000 mcg by mouth daily.  Marland Kitchen KLOR-CON M20 20 MEQ tablet TAKE 1 TABLET DAILY  . NIFEdipine (ADALAT CC) 60 MG 24 hr tablet TAKE 1 TABLET DAILY  . omeprazole (PRILOSEC) 20 MG capsule TAKE 1 CAPSULE DAILY  . raloxifene (EVISTA) 60 MG tablet TAKE 1 TABLET DAILY  . Diclofenac Sodium 3 % GEL Apply 1 application topically 2 (two) times daily as needed (for back pain). (Patient not taking: Reported on 03/10/2020)  . meclizine (ANTIVERT) 25 MG tablet TAKE 1 TABLET BY MOUTH THREE TIMES DAILY AS NEEDED FOR DIZZINESS (Patient not taking: Reported on 05/28/2020)  . tiZANidine (ZANAFLEX) 2 MG tablet Take 1 tablet (2 mg total) by mouth every 6 (six) hours as needed for muscle spasms. (Patient not taking: Reported on 07/27/2020)   No facility-administered encounter medications on file as of 07/27/2020.    Allergies (verified) Patient has no known allergies.   History: Past Medical History:  Diagnosis Date  . Chronic kidney disease   . GERD (gastroesophageal reflux disease)   . Gout   . Hypertension   . Hypopotassemia   . Osteoarthritis   . Osteoporosis    LUMBAR SPINE  . Vitamin B 12 deficiency   . Vitamin D deficiency    Past Surgical History:  Procedure Laterality Date  . ABDOMINAL HYSTERECTOMY     PARTIAL  . EYE SURGERY Left 05/2018   cataract surgery   . JOINT REPLACEMENT Bilateral 2010,2011   KNEE   Family History  Problem Relation Age of Onset  . Heart disease  Mother   . Cancer Father   . Alzheimer's disease Father   . Breast cancer Neg Hx    Social History   Socioeconomic History  . Marital status: Married    Spouse name: Not on file  . Number of children: Not on file  . Years of education: Not on file  . Highest education level: High school graduate  Occupational History  . Not on file  Tobacco Use  . Smoking status: Never Smoker  . Smokeless tobacco: Never Used  Vaping Use  . Vaping Use:  Never used  Substance and Sexual Activity  . Alcohol use: No  . Drug use: No  . Sexual activity: Yes  Other Topics Concern  . Not on file  Social History Narrative  . Not on file   Social Determinants of Health   Financial Resource Strain: Low Risk   . Difficulty of Paying Living Expenses: Not hard at all  Food Insecurity: No Food Insecurity  . Worried About Charity fundraiser in the Last Year: Never true  . Ran Out of Food in the Last Year: Never true  Transportation Needs: No Transportation Needs  . Lack of Transportation (Medical): No  . Lack of Transportation (Non-Medical): No  Physical Activity: Inactive  . Days of Exercise per Week: 0 days  . Minutes of Exercise per Session: 0 min  Stress: No Stress Concern Present  . Feeling of Stress : Not at all  Social Connections:   . Frequency of Communication with Friends and Family:   . Frequency of Social Gatherings with Friends and Family:   . Attends Religious Services:   . Active Member of Clubs or Organizations:   . Attends Archivist Meetings:   Marland Kitchen Marital Status:     Tobacco Counseling Counseling given: Not Answered   Clinical Intake:  Pre-visit preparation completed: Yes  Pain : No/denies pain     Nutritional Status: BMI 25 -29 Overweight Nutritional Risks: None Diabetes: No  How often do you need to have someone help you when you read instructions, pamphlets, or other written materials from your doctor or pharmacy?: 1 - Never What is the last grade level you completed in school?: 12th grade  Diabetic? no  Interpreter Needed?: No  Information entered by :: NAllen LPN   Activities of Daily Living In your present state of health, do you have any difficulty performing the following activities: 07/27/2020  Hearing? N  Vision? N  Difficulty concentrating or making decisions? N  Walking or climbing stairs? N  Dressing or bathing? N  Doing errands, shopping? N  Preparing Food and eating ? N    Using the Toilet? N  In the past six months, have you accidently leaked urine? N  Do you have problems with loss of bowel control? N  Managing your Medications? N  Managing your Finances? N  Housekeeping or managing your Housekeeping? N  Some recent data might be hidden    Patient Care Team: Venita Lick, NP as PCP - General (Nurse Practitioner) Marlowe Sax, MD as Referring Physician (Internal Medicine) Earlie Server, MD as Consulting Physician (Oncology)  Indicate any recent Medical Services you may have received from other than [facility] providers in the past year (date may be approximate).     Assessment:   This is a routine wellness examination for Pence.  Hearing/Vision screen  Hearing Screening   125Hz  250Hz  500Hz  1000Hz  2000Hz  3000Hz  4000Hz  6000Hz  8000Hz   Right ear:  Left ear:           Vision Screening Comments: No regular eye exams,   Dietary issues and exercise activities discussed: Current Exercise Habits: The patient does not participate in regular exercise at present  Goals    . DIET - INCREASE WATER INTAKE     Recommend drinking at least 6-8 glasses of water a day     . Increase water intake     Recommend drinking at least 4-5 glasses of water a day     . Patient Stated     07/27/2020, exercise more      Depression Screen PHQ 2/9 Scores 07/27/2020 07/03/2019 06/27/2018 06/21/2017 06/10/2016 06/10/2015  PHQ - 2 Score 0 0 0 0 0 0  PHQ- 9 Score 0 - - - - -    Fall Risk Fall Risk  07/27/2020 11/05/2019 07/03/2019 06/27/2018 06/21/2017  Falls in the past year? 0 0 0 No Yes  Comment - Emmi Telephone Survey: data to providers prior to load - - -  Number falls in past yr: - - - - 1  Injury with Fall? - - - - Yes  Risk for fall due to : Medication side effect - - - -  Follow up Falls evaluation completed;Education provided;Falls prevention discussed - - - -    Any stairs in or around the home? No  If so, are there any without handrails?  n/a Home free of loose throw rugs in walkways, pet beds, electrical cords, etc? Yes  Adequate lighting in your home to reduce risk of falls? Yes   ASSISTIVE DEVICES UTILIZED TO PREVENT FALLS:  Life alert? Yes  Use of a cane, walker or w/c? No  Grab bars in the bathroom? No  Shower chair or bench in shower? Yes  Elevated toilet seat or a handicapped toilet? No   TIMED UP AND GO:  Was the test performed? No .   Cognitive Function:     6CIT Screen 07/27/2020 07/03/2019 06/27/2018 06/21/2017  What Year? 0 points 0 points 0 points 0 points  What month? 0 points 0 points 0 points 0 points  What time? 0 points 0 points 0 points 0 points  Count back from 20 0 points 0 points 0 points 0 points  Months in reverse 4 points 0 points 0 points 0 points  Repeat phrase 0 points 0 points 2 points 0 points  Total Score 4 0 2 0    Immunizations Immunization History  Administered Date(s) Administered  . Pneumococcal Polysaccharide-23 03/23/2005  . Td 03/23/2005    TDAP status: Due, Education has been provided regarding the importance of this vaccine. Advised may receive this vaccine at local pharmacy or Health Dept. Aware to provide a copy of the vaccination record if obtained from local pharmacy or Health Dept. Verbalized acceptance and understanding. Flu Vaccine status: Declined, Education has been provided regarding the importance of this vaccine but patient still declined. Advised may receive this vaccine at local pharmacy or Health Dept. Aware to provide a copy of the vaccination record if obtained from local pharmacy or Health Dept. Verbalized acceptance and understanding. Pneumococcal vaccine status: Declined,  Education has been provided regarding the importance of this vaccine but patient still declined. Advised may receive this vaccine at local pharmacy or Health Dept. Aware to provide a copy of the vaccination record if obtained from local pharmacy or Health Dept. Verbalized acceptance and  understanding.  Covid-19 vaccine status: Completed vaccines  Qualifies for Shingles Vaccine? Yes  Zostavax completed Yes   Shingrix Completed?: No.    Education has been provided regarding the importance of this vaccine. Patient has been advised to call insurance company to determine out of pocket expense if they have not yet received this vaccine. Advised may also receive vaccine at local pharmacy or Health Dept. Verbalized acceptance and understanding.  Screening Tests Health Maintenance  Topic Date Due  . COVID-19 Vaccine (1) Never done  . PNA vac Low Risk Adult (2 of 2 - PCV13) 03/23/2006  . TETANUS/TDAP  03/24/2015  . INFLUENZA VACCINE  07/12/2020  . DEXA SCAN  Completed    Health Maintenance   Health Maintenance Due  Topic Date Due  . COVID-19 Vaccine (1) Never done  . PNA vac Low Risk Adult (2 of 2 - PCV13) 03/23/2006  . TETANUS/TDAP  03/24/2015  . INFLUENZA VACCINE  07/12/2020    Colorectal cancer screening: No longer required.  Mammogram status: No longer required.  Bone Density status: Completed 05/25/2010.   Lung Cancer Screening: (Low Dose CT Chest recommended if Age 85-80 years, 30 pack-year currently smoking OR have quit w/in 15years.) does not qualify.   Lung Cancer Screening Referral: no  Additional Screening:  Hepatitis C Screening: does not qualify;   Vision Screening: Recommended annual ophthalmology exams for early detection of glaucoma and other disorders of the eye. Is the patient up to date with their annual eye exam?  No  Who is the provider or what is the name of the office in which the patient attends annual eye exams? none If pt is not established with a provider, would they like to be referred to a provider to establish care? No .   Dental Screening: Recommended annual dental exams for proper oral hygiene  Community Resource Referral / Chronic Care Management: CRR required this visit?  No   CCM required this visit?  No      Plan:      I have personally reviewed and noted the following in the patient's chart:   . Medical and social history . Use of alcohol, tobacco or illicit drugs  . Current medications and supplements . Functional ability and status . Nutritional status . Physical activity . Advanced directives . List of other physicians . Hospitalizations, surgeries, and ER visits in previous 12 months . Vitals . Screenings to include cognitive, depression, and falls . Referrals and appointments  In addition, I have reviewed and discussed with patient certain preventive protocols, quality metrics, and best practice recommendations. A written personalized care plan for preventive services as well as general preventive health recommendations were provided to patient.     Kellie Simmering, LPN   06/08/6380   Nurse Notes:

## 2020-07-28 ENCOUNTER — Other Ambulatory Visit: Payer: Self-pay

## 2020-07-28 ENCOUNTER — Ambulatory Visit (INDEPENDENT_AMBULATORY_CARE_PROVIDER_SITE_OTHER): Payer: Medicare Other | Admitting: Family Medicine

## 2020-07-28 ENCOUNTER — Encounter: Payer: Self-pay | Admitting: Family Medicine

## 2020-07-28 VITALS — BP 161/76 | HR 55 | Temp 97.5°F | Wt 166.0 lb

## 2020-07-28 DIAGNOSIS — M109 Gout, unspecified: Secondary | ICD-10-CM | POA: Diagnosis not present

## 2020-07-28 MED ORDER — INDOMETHACIN 50 MG PO CAPS
50.0000 mg | ORAL_CAPSULE | Freq: Two times a day (BID) | ORAL | 0 refills | Status: DC
Start: 2020-07-28 — End: 2021-04-14

## 2020-07-28 NOTE — Progress Notes (Signed)
BP (!) 161/76   Pulse (!) 55   Temp (!) 97.5 F (36.4 C) (Oral)   Wt 166 lb (75.3 kg)   LMP  (LMP Unknown)   SpO2 100%   BMI 27.62 kg/m    Subjective:    Patient ID: Cynthia Hurst, female    DOB: [birth year], 85 y.o.   MRN: [id number]  HPI: Cynthia Hurst is a 85 y.o. female  Chief Complaint  Patient presents with  . Hand Pain    left hand swollen since last Sunday   Here today with left thumb swelling, redness, warmth and significant pain for about 3 days now. No injury, bites, fever, chills, sweats. Taking tylenol prn without much benefit. Does have a hx of gout, has had it in several locations. Takes low dose allopurinol daily.   Relevant past medical, surgical, family and social history reviewed and updated as indicated. Interim medical history since our last visit reviewed. Allergies and medications reviewed and updated.  Review of Systems  Per HPI unless specifically indicated above     Objective:    BP (!) 161/76   Pulse (!) 55   Temp (!) 97.5 F (36.4 C) (Oral)   Wt 166 lb (75.3 kg)   LMP  (LMP Unknown)   SpO2 100%   BMI 27.62 kg/m   Wt Readings from Last 3 Encounters:  07/28/20 166 lb (75.3 kg)  07/27/20 160 lb (72.6 kg)  05/28/20 159 lb 6.4 oz (72.3 kg)    Physical Exam Vitals and nursing note reviewed.  Constitutional:      Appearance: Normal appearance. She is not ill-appearing.  HENT:     Head: Atraumatic.  Eyes:     Extraocular Movements: Extraocular movements intact.     Conjunctiva/sclera: Conjunctivae normal.  Cardiovascular:     Rate and Rhythm: Normal rate and regular rhythm.     Heart sounds: Normal heart sounds.  Pulmonary:     Effort: Pulmonary effort is normal.     Breath sounds: Normal breath sounds.  Musculoskeletal:        General: Tenderness (base of right thumb erythematous, edematous, warm to the touch, and significantly tender to light and firm palpation) present. Normal range of motion.     Cervical back: Normal range  of motion and neck supple.  Skin:    General: Skin is warm and dry.  Neurological:     Mental Status: She is alert and oriented to person, place, and time.  Psychiatric:        Mood and Affect: Mood normal.        Thought Content: Thought content normal.        Judgment: Judgment normal.     Results for orders placed or performed in visit on 07/28/20  Uric acid  Result Value Ref Range   Uric Acid 4.1 3.1 - 7.9 mg/dL  Comprehensive metabolic panel  Result Value Ref Range   Glucose 80 65 - 99 mg/dL   BUN 15 8 - 27 mg/dL   Creatinine, Ser 0.97 0.57 - 1.00 mg/dL   GFR calc non Af Amer 54 (L) >59 mL/min/1.73   GFR calc Af Amer 62 >59 mL/min/1.73   BUN/Creatinine Ratio 15 12 - 28   Sodium 143 134 - 144 mmol/L   Potassium 4.4 3.5 - 5.2 mmol/L   Chloride 105 96 - 106 mmol/L   CO2 25 20 - 29 mmol/L   Calcium 9.7 8.7 - 10.3 mg/dL   Total Protein 6.7  6.0 - 8.5 g/dL   Albumin 4.1 3.6 - 4.6 g/dL   Globulin, Total 2.6 1.5 - 4.5 g/dL   Albumin/Globulin Ratio 1.6 1.2 - 2.2   Bilirubin Total 0.7 0.0 - 1.2 mg/dL   Alkaline Phosphatase 110 48 - 121 IU/L   AST 14 0 - 40 IU/L   ALT 6 0 - 32 IU/L      Assessment & Plan:   Problem List Items Addressed This Visit      Other   Gout - Primary    Suspect gout flare, obtain labs, tx with indomethacin BID prn for flare. Tart cherry supplements recommended. Continue allopurinol      Relevant Orders   Uric acid (Completed)   Comprehensive metabolic panel (Completed)       Follow up plan: Return if symptoms worsen or fail to improve.

## 2020-07-28 NOTE — Patient Instructions (Addendum)
Tart cherry supplements - you can take daily to help reduce uric acid   Low-Purine Eating Plan A low-purine eating plan involves making food choices to limit your intake of purine. Purine is a kind of uric acid. Too much uric acid in your blood can cause certain conditions, such as gout and kidney stones. Eating a low-purine diet can help control these conditions. What are tips for following this plan? Reading food labels   Avoid foods with saturated or Trans fat.  Check the ingredient list of grains-based foods, such as bread and cereal, to make sure that they contain whole grains.  Check the ingredient list of sauces or soups to make sure they do not contain meat or fish.  When choosing soft drinks, check the ingredient list to make sure they do not contain high-fructose corn syrup. Shopping  Buy plenty of fresh fruits and vegetables.  Avoid buying canned or fresh fish.  Buy dairy products labeled as low-fat or nonfat.  Avoid buying premade or processed foods. These foods are often high in fat, salt (sodium), and added sugar. Cooking  Use olive oil instead of butter when cooking. Oils like olive oil, canola oil, and sunflower oil contain healthy fats. Meal planning  Learn which foods do or do not affect you. If you find out that a food tends to cause your gout symptoms to flare up, avoid eating that food. You can enjoy foods that do not cause problems. If you have any questions about a food item, talk with your dietitian or health care provider.  Limit foods high in fat, especially saturated fat. Fat makes it harder for your body to get rid of uric acid.  Choose foods that are lower in fat and are lean sources of protein. General guidelines  Limit alcohol intake to no more than 1 drink a day for nonpregnant women and 2 drinks a day for men. One drink equals 12 oz of beer, 5 oz of wine, or 1 oz of hard liquor. Alcohol can affect the way your body gets rid of uric acid.  Drink  plenty of water to keep your urine clear or pale yellow. Fluids can help remove uric acid from your body.  If directed by your health care provider, take a vitamin C supplement.  Work with your health care provider and dietitian to develop a plan to achieve or maintain a healthy weight. Losing weight can help reduce uric acid in your blood. What foods are recommended? The items listed may not be a complete list. Talk with your dietitian about what dietary choices are best for you. Foods low in purines Foods low in purines do not need to be limited. These include:  All fruits.  All low-purine vegetables, pickles, and olives.  Breads, pasta, rice, cornbread, and popcorn. Cake and other baked goods.  All dairy foods.  Eggs, nuts, and nut butters.  Spices and condiments, such as salt, herbs, and vinegar.  Plant oils, butter, and margarine.  Water, sugar-free soft drinks, tea, coffee, and cocoa.  Vegetable-based soups, broths, sauces, and gravies. Foods moderate in purines Foods moderate in purines should be limited to the amounts listed.   cup of asparagus, cauliflower, spinach, mushrooms, or green peas, each day.  2/3 cup uncooked oatmeal, each day.   cup dry wheat bran or wheat germ, each day.  2-3 ounces of meat or poultry, each day.  4-6 ounces of shellfish, such as crab, lobster, oysters, or shrimp, each day.  1 cup cooked  beans, peas, or lentils, each day.  Soup, broths, or bouillon made from meat or fish. Limit these foods as much as possible. What foods are not recommended? The items listed may not be a complete list. Talk with your dietitian about what dietary choices are best for you. Limit your intake of foods high in purines, including:  Beer and other alcohol.  Meat-based gravy or sauce.  Canned or fresh fish, such as: ? Anchovies, sardines, herring, and tuna. ? Mussels and scallops. ? Codfish, trout, and haddock.  Berniece Salines.  Organ meats, such  as: ? Liver or kidney. ? Tripe. ? Sweetbreads (thymus gland or pancreas).  Wild Clinical biochemist.  Yeast or yeast extract supplements.  Drinks sweetened with high-fructose corn syrup. Summary  Eating a low-purine diet can help control conditions caused by too much uric acid in the body, such as gout or kidney stones.  Choose low-purine foods, limit alcohol, and limit foods high in fat.  You will learn over time which foods do or do not affect you. If you find out that a food tends to cause your gout symptoms to flare up, avoid eating that food. This information is not intended to replace advice given to you by your health care provider. Make sure you discuss any questions you have with your health care provider. Document Revised: 11/10/2017 Document Reviewed: 01/11/2017 Elsevier Patient Education  2020 [employer].

## 2020-07-29 LAB — COMPREHENSIVE METABOLIC PANEL
ALT: 6 IU/L (ref 0–32)
AST: 14 IU/L (ref 0–40)
Albumin/Globulin Ratio: 1.6 (ref 1.2–2.2)
Albumin: 4.1 g/dL (ref 3.6–4.6)
Alkaline Phosphatase: 110 IU/L (ref 48–121)
BUN/Creatinine Ratio: 15 (ref 12–28)
BUN: 15 mg/dL (ref 8–27)
Bilirubin Total: 0.7 mg/dL (ref 0.0–1.2)
CO2: 25 mmol/L (ref 20–29)
Calcium: 9.7 mg/dL (ref 8.7–10.3)
Chloride: 105 mmol/L (ref 96–106)
Creatinine, Ser: 0.97 mg/dL (ref 0.57–1.00)
GFR calc Af Amer: 62 mL/min/{1.73_m2} (ref >59)
GFR calc non Af Amer: 54 mL/min/{1.73_m2} — ABNORMAL LOW (ref >59)
Globulin, Total: 2.6 g/dL (ref 1.5–4.5)
Glucose: 80 mg/dL (ref 65–99)
Potassium: 4.4 mmol/L (ref 3.5–5.2)
Sodium: 143 mmol/L (ref 134–144)
Total Protein: 6.7 g/dL (ref 6.0–8.5)

## 2020-07-29 LAB — URIC ACID: Uric Acid: 4.1 mg/dL (ref 3.1–7.9)

## 2020-07-31 ENCOUNTER — Encounter: Payer: Self-pay | Admitting: Family Medicine

## 2020-08-03 NOTE — Assessment & Plan Note (Signed)
Suspect gout flare, obtain labs, tx with indomethacin BID prn for flare. Tart cherry supplements recommended. Continue allopurinol

## 2020-08-12 ENCOUNTER — Other Ambulatory Visit: Payer: Self-pay

## 2020-08-12 ENCOUNTER — Inpatient Hospital Stay: Payer: Medicare Other | Attending: Oncology

## 2020-08-12 DIAGNOSIS — D472 Monoclonal gammopathy: Secondary | ICD-10-CM | POA: Diagnosis not present

## 2020-08-12 DIAGNOSIS — M818 Other osteoporosis without current pathological fracture: Secondary | ICD-10-CM | POA: Insufficient documentation

## 2020-08-12 DIAGNOSIS — N183 Chronic kidney disease, stage 3 unspecified: Secondary | ICD-10-CM | POA: Insufficient documentation

## 2020-08-12 DIAGNOSIS — I129 Hypertensive chronic kidney disease with stage 1 through stage 4 chronic kidney disease, or unspecified chronic kidney disease: Secondary | ICD-10-CM | POA: Insufficient documentation

## 2020-08-12 DIAGNOSIS — R768 Other specified abnormal immunological findings in serum: Secondary | ICD-10-CM | POA: Diagnosis not present

## 2020-08-12 DIAGNOSIS — Z79899 Other long term (current) drug therapy: Secondary | ICD-10-CM | POA: Insufficient documentation

## 2020-08-12 LAB — CBC WITH DIFFERENTIAL/PLATELET
Abs Immature Granulocytes: 0.02 10*3/uL (ref 0.00–0.07)
Basophils Absolute: 0 10*3/uL (ref 0.0–0.1)
Basophils Relative: 1 %
Eosinophils Absolute: 0.1 10*3/uL (ref 0.0–0.5)
Eosinophils Relative: 1 %
HCT: 38.2 % (ref 36.0–46.0)
Hemoglobin: 13 g/dL (ref 12.0–15.0)
Immature Granulocytes: 0 %
Lymphocytes Relative: 29 %
Lymphs Abs: 2.1 10*3/uL (ref 0.7–4.0)
MCH: 30.7 pg (ref 26.0–34.0)
MCHC: 34 g/dL (ref 30.0–36.0)
MCV: 90.1 fL (ref 80.0–100.0)
Monocytes Absolute: 0.7 10*3/uL (ref 0.1–1.0)
Monocytes Relative: 9 %
Neutro Abs: 4.3 10*3/uL (ref 1.7–7.7)
Neutrophils Relative %: 60 %
Platelets: 179 10*3/uL (ref 150–400)
RBC: 4.24 MIL/uL (ref 3.87–5.11)
RDW: 14.3 % (ref 11.5–15.5)
WBC: 7.3 10*3/uL (ref 4.0–10.5)
nRBC: 0 % (ref 0.0–0.2)

## 2020-08-12 LAB — COMPREHENSIVE METABOLIC PANEL
ALT: 9 U/L (ref 0–44)
AST: 19 U/L (ref 15–41)
Albumin: 4.1 g/dL (ref 3.5–5.0)
Alkaline Phosphatase: 104 U/L (ref 38–126)
Anion gap: 7 (ref 5–15)
BUN: 16 mg/dL (ref 8–23)
CO2: 28 mmol/L (ref 22–32)
Calcium: 9.2 mg/dL (ref 8.9–10.3)
Chloride: 104 mmol/L (ref 98–111)
Creatinine, Ser: 1.17 mg/dL — ABNORMAL HIGH (ref 0.44–1.00)
GFR calc Af Amer: 50 mL/min — ABNORMAL LOW (ref >60)
GFR calc non Af Amer: 43 mL/min — ABNORMAL LOW (ref >60)
Glucose, Bld: 100 mg/dL — ABNORMAL HIGH (ref 70–99)
Potassium: 4.2 mmol/L (ref 3.5–5.1)
Sodium: 139 mmol/L (ref 135–145)
Total Bilirubin: 0.7 mg/dL (ref 0.3–1.2)
Total Protein: 7.7 g/dL (ref 6.5–8.1)

## 2020-08-13 LAB — MULTIPLE MYELOMA PANEL, SERUM
Albumin SerPl Elph-Mcnc: 3.5 g/dL (ref 2.9–4.4)
Albumin/Glob SerPl: 1.1 (ref 0.7–1.7)
Alpha 1: 0.3 g/dL (ref 0.0–0.4)
Alpha2 Glob SerPl Elph-Mcnc: 0.7 g/dL (ref 0.4–1.0)
B-Globulin SerPl Elph-Mcnc: 1 g/dL (ref 0.7–1.3)
Gamma Glob SerPl Elph-Mcnc: 1.4 g/dL (ref 0.4–1.8)
Globulin, Total: 3.5 g/dL (ref 2.2–3.9)
IgA: 215 mg/dL (ref 64–422)
IgG (Immunoglobin G), Serum: 1194 mg/dL (ref 586–1602)
IgM (Immunoglobulin M), Srm: 256 mg/dL — ABNORMAL HIGH (ref 26–217)
Total Protein ELP: 7 g/dL (ref 6.0–8.5)

## 2020-08-13 LAB — MISC LABCORP TEST (SEND OUT): Labcorp test code: 143000

## 2020-08-13 LAB — KAPPA/LAMBDA LIGHT CHAINS
Kappa free light chain: 39.1 mg/L — ABNORMAL HIGH (ref 3.3–19.4)
Kappa, lambda light chain ratio: 1.48 (ref 0.26–1.65)
Lambda free light chains: 26.5 mg/L — ABNORMAL HIGH (ref 5.7–26.3)

## 2020-08-19 ENCOUNTER — Inpatient Hospital Stay: Payer: Medicare Other | Admitting: Oncology

## 2020-08-20 ENCOUNTER — Inpatient Hospital Stay (HOSPITAL_BASED_OUTPATIENT_CLINIC_OR_DEPARTMENT_OTHER): Payer: Medicare Other | Admitting: Oncology

## 2020-08-20 ENCOUNTER — Other Ambulatory Visit: Payer: Self-pay

## 2020-08-20 ENCOUNTER — Encounter: Payer: Self-pay | Admitting: Oncology

## 2020-08-20 ENCOUNTER — Other Ambulatory Visit: Payer: Self-pay | Admitting: Family Medicine

## 2020-08-20 VITALS — BP 125/65 | HR 57 | Temp 97.6°F | Resp 16 | Wt 164.8 lb

## 2020-08-20 DIAGNOSIS — N183 Chronic kidney disease, stage 3 unspecified: Secondary | ICD-10-CM

## 2020-08-20 DIAGNOSIS — M818 Other osteoporosis without current pathological fracture: Secondary | ICD-10-CM | POA: Diagnosis not present

## 2020-08-20 DIAGNOSIS — Z79899 Other long term (current) drug therapy: Secondary | ICD-10-CM | POA: Diagnosis not present

## 2020-08-20 DIAGNOSIS — R768 Other specified abnormal immunological findings in serum: Secondary | ICD-10-CM | POA: Diagnosis not present

## 2020-08-20 DIAGNOSIS — D472 Monoclonal gammopathy: Secondary | ICD-10-CM | POA: Diagnosis not present

## 2020-08-20 DIAGNOSIS — I129 Hypertensive chronic kidney disease with stage 1 through stage 4 chronic kidney disease, or unspecified chronic kidney disease: Secondary | ICD-10-CM | POA: Diagnosis not present

## 2020-08-20 NOTE — Progress Notes (Signed)
Hematology/Oncology follow up note [facility] [phone] [phone]   Patient Care Team: Venita Lick, NP as PCP - General (Nurse Practitioner) Marlowe Sax, MD as Referring Physician (Internal Medicine) Earlie Server, MD as Consulting Physician (Oncology)   REASON FOR VISIT Follow up for management of MGUS  HISTORY OF PRESENTING ILLNESS:  Cynthia Hurst is a  85 y.o.  female with PMH listed below who was referred to me for evaluation of hypercalcemia.  Patient was referred to see a see rheumatology for evaluation of positive ANA, joint pain.  Lab workup reviewed hypercalcemia of 11, normal PTH, normal 25-hydroxy vitamin D level, normal TSH, SPEP showed  monoclonal spike of 0.4.  Immunofixation showed elevated IgA monoclonal protein with kappa light chain specificity. Patient reports feeling well, she denies any fatigue, weight loss, back pain history of fracture.  She has bilateral knee replacement reports having posterior thigh pain bilaterally. Patient denies taking any calcium supplements.  Never smoker  # Bone marrow biopsy results was reviewed with patient and her family members in details.  Showed 2% plasma cells, consistent with MGUS  .INTERVAL HISTORY Cynthia Hurst is a 85 y.o. female who has above history reviewed by me today presents for follow-up visit for management of IgA MGUS. Patient reports feeling well at baseline.  She was accompanied by her family member No new complaints.  She denies any back pain Appetite is good..  Weight is stable.  Review of Systems  Constitutional: Negative for chills, fever, malaise/fatigue and weight loss.  HENT: Negative for sore throat.   Eyes: Negative for redness.  Respiratory: Negative for cough, shortness of breath and wheezing.   Cardiovascular: Negative for chest pain, palpitations and leg swelling.  Gastrointestinal: Negative for abdominal pain, blood in stool, nausea and  vomiting.  Genitourinary: Negative for dysuria.       Left flank pain, intermittent.   Musculoskeletal: Negative for myalgias.  Skin: Negative for rash.  Neurological: Negative for dizziness, tingling and tremors.  Endo/Heme/Allergies: Does not bruise/bleed easily.  Psychiatric/Behavioral: Negative for hallucinations.    MEDICAL HISTORY:  Past Medical History:  Diagnosis Date  . Chronic kidney disease   . GERD (gastroesophageal reflux disease)   . Gout   . Hypertension   . Hypopotassemia   . Osteoarthritis   . Osteoporosis    LUMBAR SPINE  . Vitamin B 12 deficiency   . Vitamin D deficiency     SURGICAL HISTORY: Past Surgical History:  Procedure Laterality Date  . ABDOMINAL HYSTERECTOMY     PARTIAL  . EYE SURGERY Left 05/2018   cataract surgery   . JOINT REPLACEMENT Bilateral 2010,2011   KNEE    SOCIAL HISTORY: Social History   Socioeconomic History  . Marital status: Married    Spouse name: Not on file  . Number of children: Not on file  . Years of education: Not on file  . Highest education level: High school graduate  Occupational History  . Not on file  Tobacco Use  . Smoking status: Never Smoker  . Smokeless tobacco: Never Used  Vaping Use  . Vaping Use: Never used  Substance and Sexual Activity  . Alcohol use: No  . Drug use: No  . Sexual activity: Yes  Other Topics Concern  . Not on file  Social History Narrative  . Not on file   Social Determinants of Health   Financial Resource Strain: Low Risk   . Difficulty of Paying Living Expenses: Not hard  at all  Food Insecurity: No Food Insecurity  . Worried About Charity fundraiser in the Last Year: Never true  . Ran Out of Food in the Last Year: Never true  Transportation Needs: No Transportation Needs  . Lack of Transportation (Medical): No  . Lack of Transportation (Non-Medical): No  Physical Activity: Inactive  . Days of Exercise per Week: 0 days  . Minutes of Exercise per Session: 0 min   Stress: No Stress Concern Present  . Feeling of Stress : Not at all  Social Connections:   . Frequency of Communication with Friends and Family: Not on file  . Frequency of Social Gatherings with Friends and Family: Not on file  . Attends Religious Services: Not on file  . Active Member of Clubs or Organizations: Not on file  . Attends Archivist Meetings: Not on file  . Marital Status: Not on file  Intimate Partner Violence:   . Fear of Current or Ex-Partner: Not on file  . Emotionally Abused: Not on file  . Physically Abused: Not on file  . Sexually Abused: Not on file    FAMILY HISTORY: Family History  Problem Relation Age of Onset  . Heart disease Mother   . Cancer Father   . Alzheimer's disease Father   . Breast cancer Neg Hx     ALLERGIES:  has No Known Allergies.  MEDICATIONS:  Current Outpatient Medications  Medication Sig Dispense Refill  . allopurinol (ZYLOPRIM) 100 MG tablet Take 1 tablet (100 mg total) by mouth daily. 90 tablet 3  . atenolol (TENORMIN) 100 MG tablet TAKE 1 TABLET DAILY 90 tablet 1  . cholecalciferol (VITAMIN D) 1000 UNITS tablet Take 1,000 Units by mouth daily.    . cyanocobalamin 2000 MCG tablet Take 2,000 mcg by mouth daily.    . Diclofenac Sodium 3 % GEL Apply 1 application topically 2 (two) times daily as needed (for back pain). 100 g 0  . indomethacin (INDOCIN) 50 MG capsule Take 1 capsule (50 mg total) by mouth 2 (two) times daily with a meal. 60 capsule 0  . KLOR-CON M20 20 MEQ tablet TAKE 1 TABLET DAILY 90 tablet 3  . NIFEdipine (ADALAT CC) 60 MG 24 hr tablet TAKE 1 TABLET DAILY 90 tablet 0  . omeprazole (PRILOSEC) 20 MG capsule TAKE 1 CAPSULE DAILY 90 capsule 3  . raloxifene (EVISTA) 60 MG tablet TAKE 1 TABLET DAILY 90 tablet 3   No current facility-administered medications for this visit.     PHYSICAL EXAMINATION: ECOG PERFORMANCE STATUS: 0 - Asymptomatic Vitals:   08/20/20 0949  BP: 125/65  Pulse: (!) 57  Resp: 16   Temp: 97.6 F (36.4 C)   Filed Weights   08/20/20 0949  Weight: 164 lb 12.8 oz (74.8 kg)    Physical Exam Constitutional:      General: She is not in acute distress. HENT:     Head: Normocephalic and atraumatic.  Eyes:     General: No scleral icterus.    Pupils: Pupils are equal, round, and reactive to light.  Cardiovascular:     Rate and Rhythm: Normal rate and regular rhythm.     Heart sounds: Normal heart sounds.  Pulmonary:     Effort: Pulmonary effort is normal. No respiratory distress.     Breath sounds: Normal breath sounds. No wheezing or rales.  Chest:     Chest wall: No tenderness.  Abdominal:     General: Bowel sounds are normal. There  is no distension.     Palpations: Abdomen is soft. There is no mass.     Tenderness: There is no abdominal tenderness.  Musculoskeletal:        General: No deformity. Normal range of motion.     Cervical back: Normal range of motion and neck supple.  Lymphadenopathy:     Cervical: No cervical adenopathy.  Skin:    General: Skin is warm and dry.     Findings: No erythema or rash.  Neurological:     Mental Status: She is alert and oriented to person, place, and time. Mental status is at baseline.     Cranial Nerves: No cranial nerve deficit.  Psychiatric:        Mood and Affect: Mood normal.      LABORATORY DATA:  I have reviewed the data as listed Lab Results  Component Value Date   WBC 7.3 08/12/2020   HGB 13.0 08/12/2020   HCT 38.2 08/12/2020   MCV 90.1 08/12/2020   PLT 179 08/12/2020   Recent Labs    05/24/20 2226 07/28/20 0941 08/12/20 1309  NA 140 143 139  K 4.6 4.4 4.2  CL 106 105 104  CO2 '28 25 28  ' GLUCOSE 96 80 100*  BUN '22 15 16  ' CREATININE 1.11* 0.97 1.17*  CALCIUM 9.5 9.7 9.2  GFRNONAA 46* 54* 43*  GFRAA 53* 62 50*  PROT 7.1 6.7 7.7  ALBUMIN 3.9 4.1 4.1  AST '19 14 19  ' ALT '10 6 9  ' ALKPHOS 102 110 104  BILITOT 0.6 0.7 0.7    03/12/2018 multiple myeloma work up  [facility] clinic: SPEP showed   monoclonal spike of 0.4.  Immunofixation showed elevated IgA monoclonal protein with kappa light chain specificity.Urine light chain ratio elevated at 12.54, no monoclonal spike on UPEP.   Lab Results  Component Value Date   TOTALPROTELP 7.0 08/12/2020   ALBUMINELP 3.6 10/19/2018   A1GS 0.3 10/19/2018   A2GS 0.6 10/19/2018   BETS 0.9 10/19/2018   GAMS 1.2 10/19/2018   MSPIKE Not Observed 10/19/2018   SPEI Comment 10/19/2018   Lab Results  Component Value Date   KPAFRELGTCHN 39.1 (H) 08/12/2020   LAMBDASER 26.5 (H) 08/12/2020   KAPLAMBRATIO 1.48 08/12/2020    ASSESSMENT & PLAN:  1. MGUS (monoclonal gammopathy of unknown significance)   2. Stage 3 chronic kidney disease, unspecified whether stage 3a or 3b CKD    IgA MGUS with kappa light chain restriction. Labs are reviewed and discussed with patient. Normal light chain ratio, although ratio has increased since last visit.  No monoclonal protein.  Continue monitor.   # CKD, avoid nephrotoxins.  All questions were answered. The patient knows to call the clinic with any problems questions or concerns.  Return of visit: 6 months, repeat CBC, CMP, multiple myeloma, light chain ratio,  1 week prior to visits.. We spent sufficient time to discuss many aspect of care, questions were answered to patient's satisfaction.    Earlie Server, MD, PhD Hematology Oncology [facility] [address] Pager- [phone] 08/20/2020

## 2020-08-20 NOTE — Progress Notes (Signed)
Patient denies new problems/concerns today.   °

## 2020-09-07 DIAGNOSIS — I10 Essential (primary) hypertension: Secondary | ICD-10-CM | POA: Diagnosis not present

## 2020-09-07 DIAGNOSIS — Z8249 Family history of ischemic heart disease and other diseases of the circulatory system: Secondary | ICD-10-CM | POA: Diagnosis not present

## 2020-09-11 ENCOUNTER — Ambulatory Visit: Payer: Medicare Other | Admitting: Nurse Practitioner

## 2020-10-01 ENCOUNTER — Ambulatory Visit (INDEPENDENT_AMBULATORY_CARE_PROVIDER_SITE_OTHER): Payer: Medicare Other | Admitting: Nurse Practitioner

## 2020-10-01 ENCOUNTER — Encounter: Payer: Self-pay | Admitting: Nurse Practitioner

## 2020-10-01 ENCOUNTER — Other Ambulatory Visit: Payer: Self-pay

## 2020-10-01 VITALS — BP 105/64 | HR 64 | Temp 97.8°F | Resp 16 | Wt 160.0 lb

## 2020-10-01 DIAGNOSIS — D472 Monoclonal gammopathy: Secondary | ICD-10-CM | POA: Diagnosis not present

## 2020-10-01 DIAGNOSIS — E559 Vitamin D deficiency, unspecified: Secondary | ICD-10-CM | POA: Diagnosis not present

## 2020-10-01 DIAGNOSIS — Z23 Encounter for immunization: Secondary | ICD-10-CM | POA: Diagnosis not present

## 2020-10-01 DIAGNOSIS — E538 Deficiency of other specified B group vitamins: Secondary | ICD-10-CM

## 2020-10-01 DIAGNOSIS — M109 Gout, unspecified: Secondary | ICD-10-CM | POA: Diagnosis not present

## 2020-10-01 DIAGNOSIS — I129 Hypertensive chronic kidney disease with stage 1 through stage 4 chronic kidney disease, or unspecified chronic kidney disease: Secondary | ICD-10-CM | POA: Diagnosis not present

## 2020-10-01 DIAGNOSIS — N1831 Chronic kidney disease, stage 3a: Secondary | ICD-10-CM | POA: Diagnosis not present

## 2020-10-01 DIAGNOSIS — K219 Gastro-esophageal reflux disease without esophagitis: Secondary | ICD-10-CM

## 2020-10-01 NOTE — Assessment & Plan Note (Signed)
Chronic, stable, minimally used Prilosec.  Continue current regimen on as needed basis and diet focus.

## 2020-10-01 NOTE — Assessment & Plan Note (Signed)
Chronic, stable.  Continue daily supplement and adjust as needed.  Obtain B12 level next visit.

## 2020-10-01 NOTE — Patient Instructions (Signed)
You can take Tylenol 500 to 1000 MG every morning for arthritis pain.   Osteoarthritis  Osteoarthritis is a type of arthritis that affects tissue that covers the ends of bones in joints (cartilage). Cartilage acts as a cushion between the bones and helps them move smoothly. Osteoarthritis results when cartilage in the joints gets worn down. Osteoarthritis is sometimes called "wear and tear" arthritis. Osteoarthritis is the most common form of arthritis. It often occurs in older people. It is a condition that gets worse over time (a progressive condition). Joints that are most often affected by this condition are in:  Fingers.  Toes.  Hips.  Knees.  Spine, including neck and lower back. What are the causes? This condition is caused by age-related wearing down of cartilage that covers the ends of bones. What increases the risk? The following factors may make you more likely to develop this condition:  Older age.  Being overweight or obese.  Overuse of joints, such as in athletes.  Past injury of a joint.  Past surgery on a joint.  Family history of osteoarthritis. What are the signs or symptoms? The main symptoms of this condition are pain, swelling, and stiffness in the joint. The joint may lose its shape over time. Small pieces of bone or cartilage may break off and float inside of the joint, which may cause more pain and damage to the joint. Small deposits of bone (osteophytes) may grow on the edges of the joint. Other symptoms may include:  A grating or scraping feeling inside the joint when you move it.  Popping or creaking sounds when you move. Symptoms may affect one or more joints. Osteoarthritis in a major joint, such as your knee or hip, can make it painful to walk or exercise. If you have osteoarthritis in your hands, you might not be able to grip items, twist your hand, or control small movements of your hands and fingers (fine motor skills). How is this  diagnosed? This condition may be diagnosed based on:  Your medical history.  A physical exam.  Your symptoms.  X-rays of the affected joint(s).  Blood tests to rule out other types of arthritis. How is this treated? There is no cure for this condition, but treatment can help to control pain and improve joint function. Treatment plans may include:  A prescribed exercise program that allows for rest and joint relief. You may work with a physical therapist.  A weight control plan.  Pain relief techniques, such as: ? Applying heat and cold to the joint. ? Electric pulses delivered to nerve endings under the skin (transcutaneous electrical nerve stimulation, or TENS). ? Massage. ? Certain nutritional supplements.  NSAIDs or prescription medicines to help relieve pain.  Medicine to help relieve pain and inflammation (corticosteroids). This can be given by mouth (orally) or as an injection.  Assistive devices, such as a brace, wrap, splint, specialized glove, or cane.  Surgery, such as: ? An osteotomy. This is done to reposition the bones and relieve pain or to remove loose pieces of bone and cartilage. ? Joint replacement surgery. You may need this surgery if you have very bad (advanced) osteoarthritis. Follow these instructions at home: Activity  Rest your affected joints as directed by your health care provider.  Do not drive or use heavy machinery while taking prescription pain medicine.  Exercise as directed. Your health care provider or physical therapist may recommend specific types of exercise, such as: ? Strengthening exercises. These are done to  strengthen the muscles that support joints that are affected by arthritis. They can be performed with weights or with exercise bands to add resistance. ? Aerobic activities. These are exercises, such as brisk walking or water aerobics, that get your heart pumping. ? Range-of-motion activities. These keep your joints easy to  move. ? Balance and agility exercises. Managing pain, stiffness, and swelling      If directed, apply heat to the affected area as often as told by your health care provider. Use the heat source that your health care provider recommends, such as a moist heat pack or a heating pad. ? If you have a removable assistive device, remove it as told by your health care provider. ? Place a towel between your skin and the heat source. If your health care provider tells you to keep the assistive device on while you apply heat, place a towel between the assistive device and the heat source. ? Leave the heat on for 20-30 minutes. ? Remove the heat if your skin turns bright red. This is especially important if you are unable to feel pain, heat, or cold. You may have a greater risk of getting burned.  If directed, put ice on the affected joint: ? If you have a removable assistive device, remove it as told by your health care provider. ? Put ice in a plastic bag. ? Place a towel between your skin and the bag. If your health care provider tells you to keep the assistive device on during icing, place a towel between the assistive device and the bag. ? Leave the ice on for 20 minutes, 2-3 times a day. General instructions  Take over-the-counter and prescription medicines only as told by your health care provider.  Maintain a healthy weight. Follow instructions from your health care provider for weight control. These may include dietary restrictions.  Do not use any products that contain nicotine or tobacco, such as cigarettes and e-cigarettes. These can delay bone healing. If you need help quitting, ask your health care provider.  Use assistive devices as directed by your health care provider.  Keep all follow-up visits as told by your health care provider. This is important. Where to find more information  [employer] of Arthritis and Musculoskeletal and Skin Diseases:  www.niams.SouthExposed.es  [employer] on Aging: http://kim-miller.com/  American College of Rheumatology: www.rheumatology.org Contact a health care provider if:  Your skin turns red.  You develop a rash.  You have pain that gets worse.  You have a fever along with joint or muscle aches. Get help right away if:  You lose a lot of weight.  You suddenly lose your appetite.  You have night sweats. Summary  Osteoarthritis is a type of arthritis that affects tissue covering the ends of bones in joints (cartilage).  This condition is caused by age-related wearing down of cartilage that covers the ends of bones.  The main symptom of this condition is pain, swelling, and stiffness in the joint.  There is no cure for this condition, but treatment can help to control pain and improve joint function. This information is not intended to replace advice given to you by your health care provider. Make sure you discuss any questions you have with your health care provider. Document Revised: 11/10/2017 Document Reviewed: 08/01/2016 Elsevier Patient Education  2020 [employer].

## 2020-10-01 NOTE — Assessment & Plan Note (Signed)
Chronic, stable with Allopurinol at 100 MG.  Continue current regimen and adjust as needed.  Labs next visit.

## 2020-10-01 NOTE — Assessment & Plan Note (Signed)
Chronic, stable.  Continue daily supplement and adjust as needed.  Labs next visit.

## 2020-10-01 NOTE — Assessment & Plan Note (Signed)
Chronic, ongoing.  BP at goal at home and in office today.  Continue current medication regimen and adjust as needed.  BMP up to date.  Renal dose medications as needed.  Recommend she continue to monitor BP at home at least a few days a week and document + focus on DASH diet.  Return in 6 months, will obtain labs next visit.

## 2020-10-01 NOTE — Progress Notes (Signed)
BP 105/64 (BP Location: Left Arm, Patient Position: Sitting, Cuff Size: Normal)   Pulse 64   Temp 97.8 F (36.6 C) (Oral)   Resp 16   Wt 160 lb (72.6 kg)   LMP  (LMP Unknown)   SpO2 98%   BMI 26.63 kg/m    Subjective:    Patient ID: Cynthia Hurst, female    DOB: [birth year], 85 y.o.   MRN: [id number]  HPI: Cynthia Hurst is a 85 y.o. female  Chief Complaint  Patient presents with  . Hypertension  . Gastroesophageal Reflux  . Chronic Kidney Disease  . Follow-up    MGUS, VIT B12, AND VIT D   HYPERTENSION Continues on Nifedipine and Atenolol.   Satisfied with current treatment? yes Duration of hypertension: chronic BP monitoring frequency: monthly BP range: unsure BP medication side effects: no Aspirin: no Recent stressors: no Recurrent headaches: no Visual changes: no Palpitations: no Dyspnea: no Chest pain: no Lower extremity edema: no Dizzy/lightheaded: no   CHRONIC KIDNEY DISEASE Last kidney function on 08/12/20 -- CRT 1.17 and GFR 50. CKD status: controlled Medications renally dose: yes Previous renal evaluation: no Pneumovax:  refused Influenza Vaccine:  Refused  GERD Continues on Omeprazole as needed. GERD control status: stable  Satisfied with current treatment? yes Heartburn frequency: occasional Medication side effects: no  Medication compliance: stable Dysphagia: no Odynophagia:  no Hematemesis: no Blood in stool: no EGD: no  GOUT Continues on Allopurinol 100 MG -- renal dosed.  Had a flare last 08/03/20.  She continues on B12 and Vitamin D3. Duration:chronic Swelling: no Redness: no Trauma: no Recent dietary change or indiscretion: no Fevers: no Nausea/vomiting: no Aggravating factors: Alleviating factors:  Status:  stable Treatments attempted:  MGUS: Saw Dr. Tasia Catchings on 08/20/20.  Had renal ultrasound on 02/14/2020 with overall normal findings, right-sided renal cysts measuring 1.8 cm and questionable mild bladder wall thickening. She is  to return to oncology in 6 months for follow-up.  Denies any symptoms at this time.  Relevant past medical, surgical, family and social history reviewed and updated as indicated. Interim medical history since our last visit reviewed. Allergies and medications reviewed and updated.  Review of Systems  Constitutional: Negative for activity change, appetite change, diaphoresis, fatigue and fever.  Respiratory: Negative for cough, chest tightness and shortness of breath.   Cardiovascular: Negative for chest pain, palpitations and leg swelling.  Gastrointestinal: Negative.   Musculoskeletal: Negative for back pain.  Neurological: Negative.   Psychiatric/Behavioral: Negative.     Per HPI unless specifically indicated above     Objective:    BP 105/64 (BP Location: Left Arm, Patient Position: Sitting, Cuff Size: Normal)   Pulse 64   Temp 97.8 F (36.6 C) (Oral)   Resp 16   Wt 160 lb (72.6 kg)   LMP  (LMP Unknown)   SpO2 98%   BMI 26.63 kg/m   Wt Readings from Last 3 Encounters:  10/01/20 160 lb (72.6 kg)  08/20/20 164 lb 12.8 oz (74.8 kg)  07/28/20 166 lb (75.3 kg)    Physical Exam Vitals and nursing note reviewed.  Constitutional:      General: She is awake. She is not in acute distress.    Appearance: She is well-developed and well-groomed. She is not ill-appearing.  HENT:     Head: Normocephalic.     Right Ear: Hearing normal.     Left Ear: Hearing normal.  Eyes:     General: Lids are normal.  Right eye: No discharge.        Left eye: No discharge.     Conjunctiva/sclera: Conjunctivae normal.     Pupils: Pupils are equal, round, and reactive to light.  Neck:     Thyroid: No thyromegaly.     Vascular: No carotid bruit.  Cardiovascular:     Rate and Rhythm: Normal rate and regular rhythm.     Heart sounds: Normal heart sounds. No murmur heard.  No gallop.   Pulmonary:     Effort: Pulmonary effort is normal. No accessory muscle usage or respiratory distress.      Breath sounds: Normal breath sounds.  Abdominal:     General: Bowel sounds are normal.     Palpations: Abdomen is soft.     Tenderness: There is no abdominal tenderness.  Musculoskeletal:     Cervical back: Normal range of motion and neck supple.     Right lower leg: No edema.     Left lower leg: No edema.     Comments: Kyphosis present.  Skin:    General: Skin is warm and dry.  Neurological:     Mental Status: She is alert and oriented to person, place, and time.  Psychiatric:        Attention and Perception: Attention normal.        Mood and Affect: Mood normal.        Speech: Speech normal.        Behavior: Behavior normal. Behavior is cooperative.        Thought Content: Thought content normal.     Results for orders placed or performed in visit on 08/12/20  Miscellaneous [facility] test (send-out)  Result Value Ref Range   [facility] test code 143000    [facility] test name NTproBNP    Misc [facility] result COMMENT   Multiple Myeloma Panel (SPEP&IFE w/QIG)  Result Value Ref Range   IgG (Immunoglobin G), Serum 1,194 586 - 1,602 mg/dL   IgA 215 64 - 422 mg/dL   IgM (Immunoglobulin M), Srm 256 (H) 26 - 217 mg/dL   Total Protein ELP 7.0 6.0 - 8.5 g/dL   Albumin SerPl Elph-Mcnc 3.5 2.9 - 4.4 g/dL   Alpha 1 0.3 0.0 - 0.4 g/dL   Alpha2 Glob SerPl Elph-Mcnc 0.7 0.4 - 1.0 g/dL   B-Globulin SerPl Elph-Mcnc 1.0 0.7 - 1.3 g/dL   Gamma Glob SerPl Elph-Mcnc 1.4 0.4 - 1.8 g/dL   M Protein SerPl Elph-Mcnc Not Observed Not Observed g/dL   Globulin, Total 3.5 2.2 - 3.9 g/dL   Albumin/Glob SerPl 1.1 0.7 - 1.7   IFE 1 Comment (A)    Please Note Comment   Kappa/lambda light chains  Result Value Ref Range   Kappa free light chain 39.1 (H) 3.3 - 19.4 mg/L   Lamda free light chains 26.5 (H) 5.7 - 26.3 mg/L   Kappa, lamda light chain ratio 1.48 0.26 - 1.65  Comprehensive metabolic panel  Result Value Ref Range   Sodium 139 135 - 145 mmol/L   Potassium 4.2 3.5 - 5.1 mmol/L   Chloride 104  98 - 111 mmol/L   CO2 28 22 - 32 mmol/L   Glucose, Bld 100 (H) 70 - 99 mg/dL   BUN 16 8 - 23 mg/dL   Creatinine, Ser 1.17 (H) 0.44 - 1.00 mg/dL   Calcium 9.2 8.9 - 10.3 mg/dL   Total Protein 7.7 6.5 - 8.1 g/dL   Albumin 4.1 3.5 - 5.0 g/dL   AST  19 15 - 41 U/L   ALT 9 0 - 44 U/L   Alkaline Phosphatase 104 38 - 126 U/L   Total Bilirubin 0.7 0.3 - 1.2 mg/dL   GFR calc non Af Amer 43 (L) >60 mL/min   GFR calc Af Amer 50 (L) >60 mL/min   Anion gap 7 5 - 15  CBC with Differential/Platelet  Result Value Ref Range   WBC 7.3 4.0 - 10.5 K/uL   RBC 4.24 3.87 - 5.11 MIL/uL   Hemoglobin 13.0 12.0 - 15.0 g/dL   HCT 38.2 36 - 46 %   MCV 90.1 80.0 - 100.0 fL   MCH 30.7 26.0 - 34.0 pg   MCHC 34.0 30.0 - 36.0 g/dL   RDW 14.3 11.5 - 15.5 %   Platelets 179 150 - 400 K/uL   nRBC 0.0 0.0 - 0.2 %   Neutrophils Relative % 60 %   Neutro Abs 4.3 1.7 - 7.7 K/uL   Lymphocytes Relative 29 %   Lymphs Abs 2.1 0.7 - 4.0 K/uL   Monocytes Relative 9 %   Monocytes Absolute 0.7 0.1 - 1.0 K/uL   Eosinophils Relative 1 %   Eosinophils Absolute 0.1 0.0 - 0.5 K/uL   Basophils Relative 1 %   Basophils Absolute 0.0 0.0 - 0.1 K/uL   Immature Granulocytes 0 %   Abs Immature Granulocytes 0.02 0.00 - 0.07 K/uL      Assessment & Plan:   Problem List Items Addressed This Visit      Cardiovascular and Mediastinum   Benign hypertension with chronic kidney disease    Chronic, ongoing.  BP at goal at home and in office today.  Continue current medication regimen and adjust as needed.  BMP up to date.  Renal dose medications as needed.  Recommend she continue to monitor BP at home at least a few days a week and document + focus on DASH diet.  Return in 6 months, will obtain labs next visit.        Digestive   GERD (gastroesophageal reflux disease)    Chronic, stable, minimally used Prilosec.  Continue current regimen on as needed basis and diet focus.        Genitourinary   CKD (chronic kidney disease), stage  III (HCC) - Primary    Chronic with CKD 3.  Monitor BMP regularly and refer to nephrology as needed if worsening CKD.  Renal dose medications as needed.  Recent BMP on file with oncology and reviewed.        Other   Gout    Chronic, stable with Allopurinol at 100 MG.  Continue current regimen and adjust as needed.  Labs next visit.      Vitamin B12 deficiency    Chronic, stable.  Continue daily supplement and adjust as needed.  Obtain B12 level next visit.      Vitamin D deficiency    Chronic, stable.  Continue daily supplement and adjust as needed.  Labs next visit.      MGUS (monoclonal gammopathy of unknown significance)    Chronic, ongoing.  Continue collaboration with oncology.       Other Visit Diagnoses    Need for influenza vaccination       Relevant Orders   Flu Vaccine QUAD High Dose(Fluad) (Completed)       Follow up plan: Return in about 6 months (around 04/01/2021).

## 2020-10-01 NOTE — Assessment & Plan Note (Signed)
Chronic with CKD 3.  Monitor BMP regularly and refer to nephrology as needed if worsening CKD.  Renal dose medications as needed.  Recent BMP on file with oncology and reviewed.

## 2020-10-01 NOTE — Assessment & Plan Note (Signed)
Chronic, ongoing.  Continue collaboration with oncology.

## 2020-10-02 ENCOUNTER — Telehealth: Payer: Self-pay

## 2020-10-02 NOTE — Telephone Encounter (Signed)
She brought it with her yesterday and it was a genetic cardiac kit, not ordered by [place].  I am not sure if oncology ordered this or where it came from, I told her to talk to her daughters to see if they were doing any genetic testing.  I wonder if this is something oncology ordered.  I would tell her to reach out to them.

## 2020-10-02 NOTE — Telephone Encounter (Signed)
[name], do you know of any swab test kits that the patient was supposed to get and complete? I do not see anything from [place]. Oncology had orders from last month but I am not sure what they all are. Just wanted to make sure it was not anything from [place].

## 2020-10-02 NOTE — Telephone Encounter (Signed)
Copied from [place] [id number]. Topic: General - Other >> Oct 02, 2020 12:05 PM Rainey Pines A wrote: Patient would like a callback from nurse to  clarify if she is suppose to complete a "swab kit test" she received in the mail. Please advise

## 2020-10-05 NOTE — Telephone Encounter (Signed)
Called and a detailed VM letting patient know what [name] said. Asked for patient to please give [place] a call back with any other questions or concerns.

## 2020-10-05 NOTE — Telephone Encounter (Signed)
Called and spoke with patient. Advised her of [name]'s message, patient verbalized understanding.

## 2020-10-05 NOTE — Telephone Encounter (Signed)
Patient is calling back for Cynthia Hurst. Patient just called back. Patient did not receive VM. CB- [phone]

## 2020-10-08 ENCOUNTER — Telehealth: Payer: Self-pay

## 2020-10-08 NOTE — Telephone Encounter (Signed)
[name] just clarifying, but we did not send anything to the patient correct?

## 2020-10-08 NOTE — Telephone Encounter (Signed)
No we definitely did not send anything.  It is some kind of genetic test kit when she showed me, not sure where it came from.

## 2020-10-08 NOTE — Telephone Encounter (Signed)
Copied from [place] [id number]. Topic: General - Call Back - No Documentation >> Oct 08, 2020 12:56 PM Erick Blinks wrote: Reason for CRM: Nicholes Calamity  [phone] Wants call back from PCP regarding samples they received in the mail from [employer]. She wants to know if this was sent from PCP? Please advise

## 2020-10-09 NOTE — Telephone Encounter (Signed)
Patient notified

## 2020-10-21 ENCOUNTER — Other Ambulatory Visit: Payer: Self-pay | Admitting: Nurse Practitioner

## 2020-10-21 NOTE — Telephone Encounter (Signed)
Requested Prescriptions  Pending Prescriptions Disp Refills  . NIFEdipine (ADALAT CC) 60 MG 24 hr tablet [Pharmacy Med Name: NIFEDIPINE ER (CC) TABS 60MG ] 90 tablet 3    Sig: TAKE 1 TABLET DAILY     Cardiovascular:  Calcium Channel Blockers Passed - 10/21/2020 12:31 AM      Passed - Last BP in normal range    BP Readings from Last 1 Encounters:  10/01/20 105/64         Passed - Valid encounter within last 6 months    Recent Outpatient Visits          2 weeks ago Stage 3a chronic kidney disease ([place])   [place], [name] T, NP   2 months ago Gout, unspecified cause, unspecified chronicity, unspecified site   [facility] [place], [place]   4 months ago Chronic left-sided low back pain without sciatica   [place], [name] T, NP   7 months ago MGUS (monoclonal gammopathy of unknown significance)   [place], [place] T, NP   8 months ago Chronic left-sided low back pain without sciatica   [facility] Eulogio Bear, NP      Future Appointments            In 5 months [name], Barbaraann Faster, NP [facility]   In 6 months  [facility]

## 2020-11-04 ENCOUNTER — Other Ambulatory Visit: Payer: Self-pay | Admitting: Nurse Practitioner

## 2020-11-24 ENCOUNTER — Telehealth: Payer: Self-pay

## 2020-11-24 DIAGNOSIS — Z23 Encounter for immunization: Secondary | ICD-10-CM | POA: Diagnosis not present

## 2020-11-24 NOTE — Telephone Encounter (Signed)
She can obtain Covid booster and recommend this.

## 2020-11-24 NOTE — Telephone Encounter (Signed)
Called pt to relay [name]'s message. Pt verbalized understanding

## 2020-11-24 NOTE — Telephone Encounter (Signed)
Called pt and advised to her that she got her flu shot on 10/01/20 pt is wondering if she should get the covid booster shot. Per chart I see that she has received both of the covid vaccines. Please advise.   Copied from [place] [phone]. Topic: General - Other >> Nov 24, 2020  2:57 PM [name] wrote: Reason for CRM: pt called in for assistance. Pt says that she received a shot in her last visit, pt is unsure of what it was. Please assist. [phone] --

## 2021-01-26 ENCOUNTER — Other Ambulatory Visit: Payer: Self-pay | Admitting: Nurse Practitioner

## 2021-01-26 NOTE — Telephone Encounter (Signed)
  Requested Prescriptions  Pending Prescriptions Disp Refills  . allopurinol (ZYLOPRIM) 100 MG tablet [Pharmacy Med Name: ALLOPURINOL TABS 100MG ] 90 tablet 0    Sig: TAKE 1 TABLET DAILY     Endocrinology:  Gout Agents Failed - 01/26/2021 12:34 AM      Failed - Cr in normal range and within 360 days    Creatinine, Ser  Date Value Ref Range Status  08/12/2020 1.17 (H) 0.44 - 1.00 mg/dL Final         Passed - Uric Acid in normal range and within 360 days    Uric Acid  Date Value Ref Range Status  07/28/2020 4.1 3.1 - 7.9 mg/dL Final    Comment:               Therapeutic target for gout patients: <6.0         Passed - Valid encounter within last 12 months    Recent Outpatient Visits          3 months ago Stage 3a chronic kidney disease ([place])   [place], [name] T, NP   6 months ago Gout, unspecified cause, unspecified chronicity, unspecified site   [facility], [place], [place]   8 months ago Chronic left-sided low back pain without sciatica   [employer], [name] T, NP   10 months ago MGUS (monoclonal gammopathy of unknown significance)   [facility] [place], [place] T, NP   11 months ago Chronic left-sided low back pain without sciatica   [facility] Eulogio Bear, NP      Future Appointments            In 2 months [name], Barbaraann Faster, NP [facility]   In 6 months  [facility]

## 2021-02-18 IMAGING — CR DG CHEST 2V
2 series · 2 of 2 positions shown · non-contrast
Comparison: Chest radiograph 12/16/2016

CLINICAL DATA: Rib pain without known injury.  Left-sided pain.

EXAM:
CHEST - 2 VIEW

[chest pa]
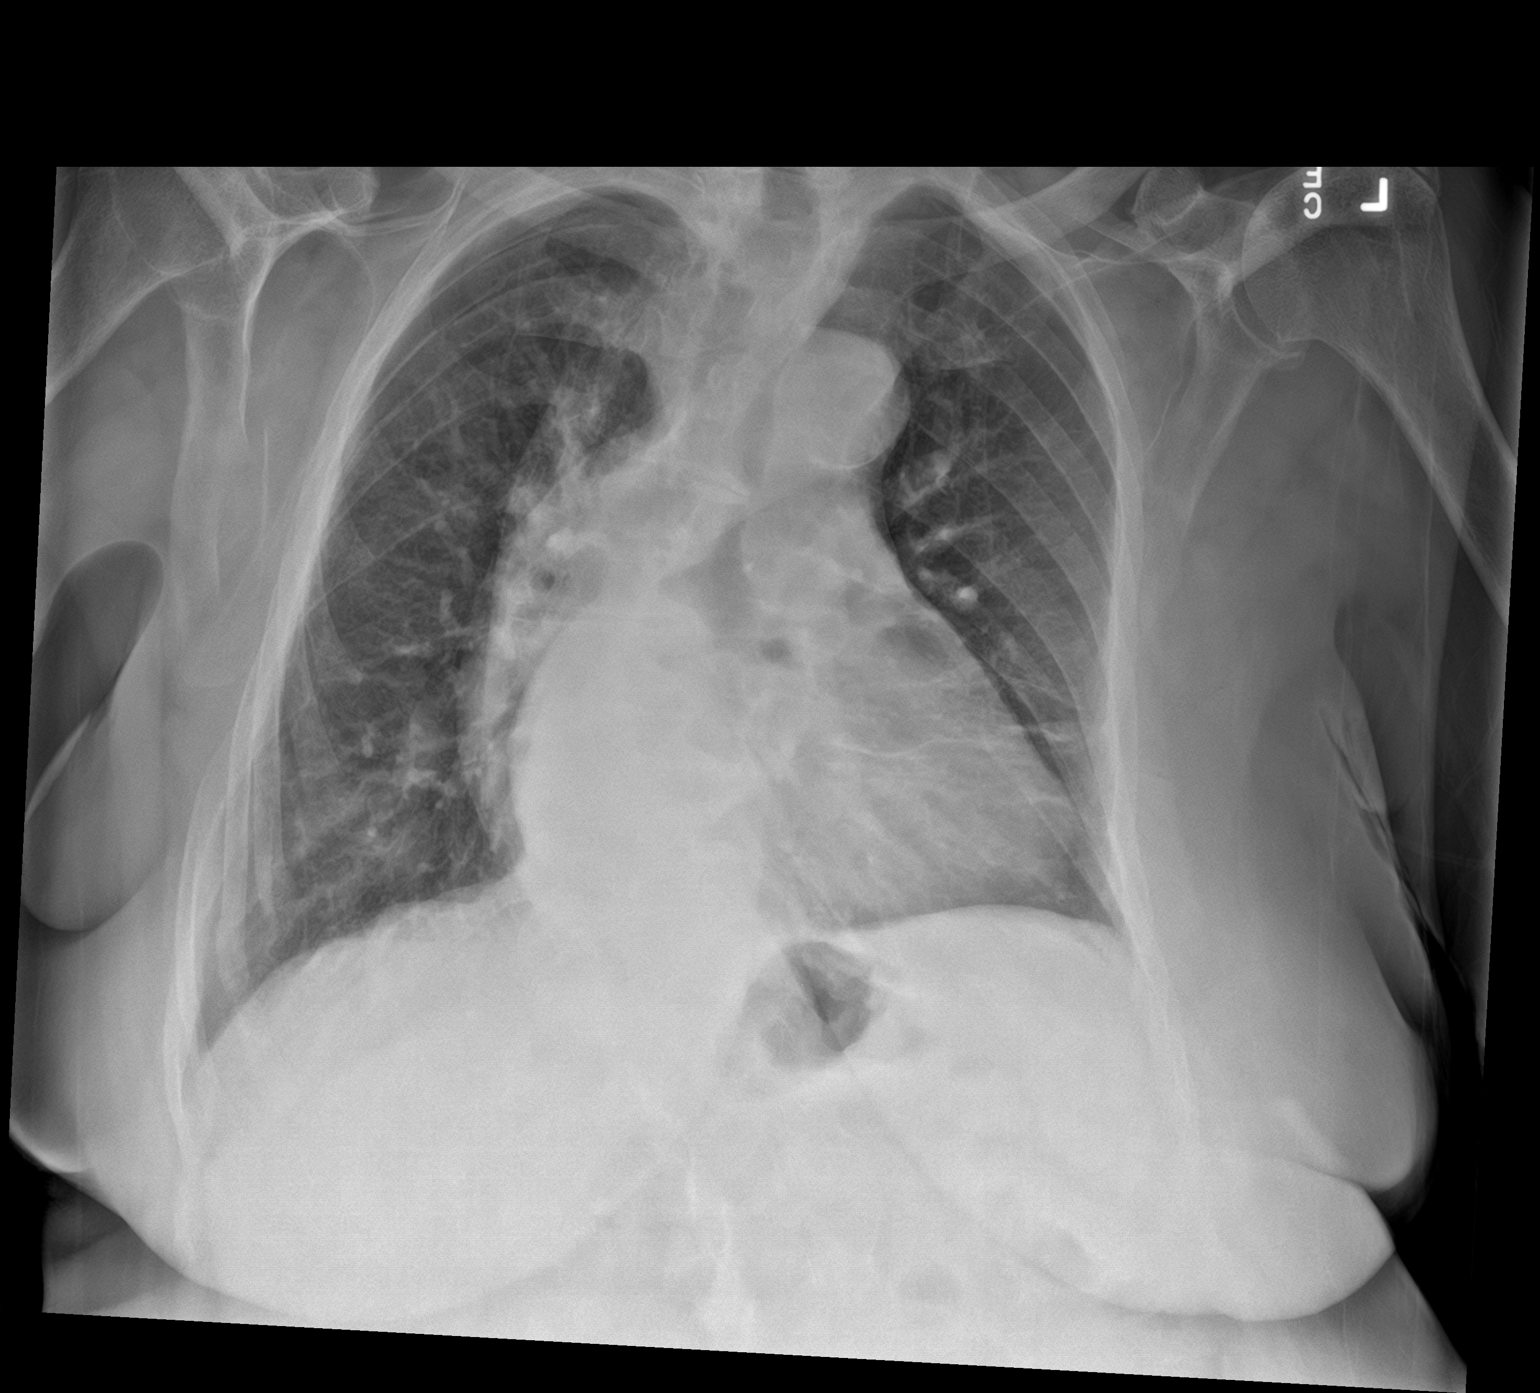

[chest lat]
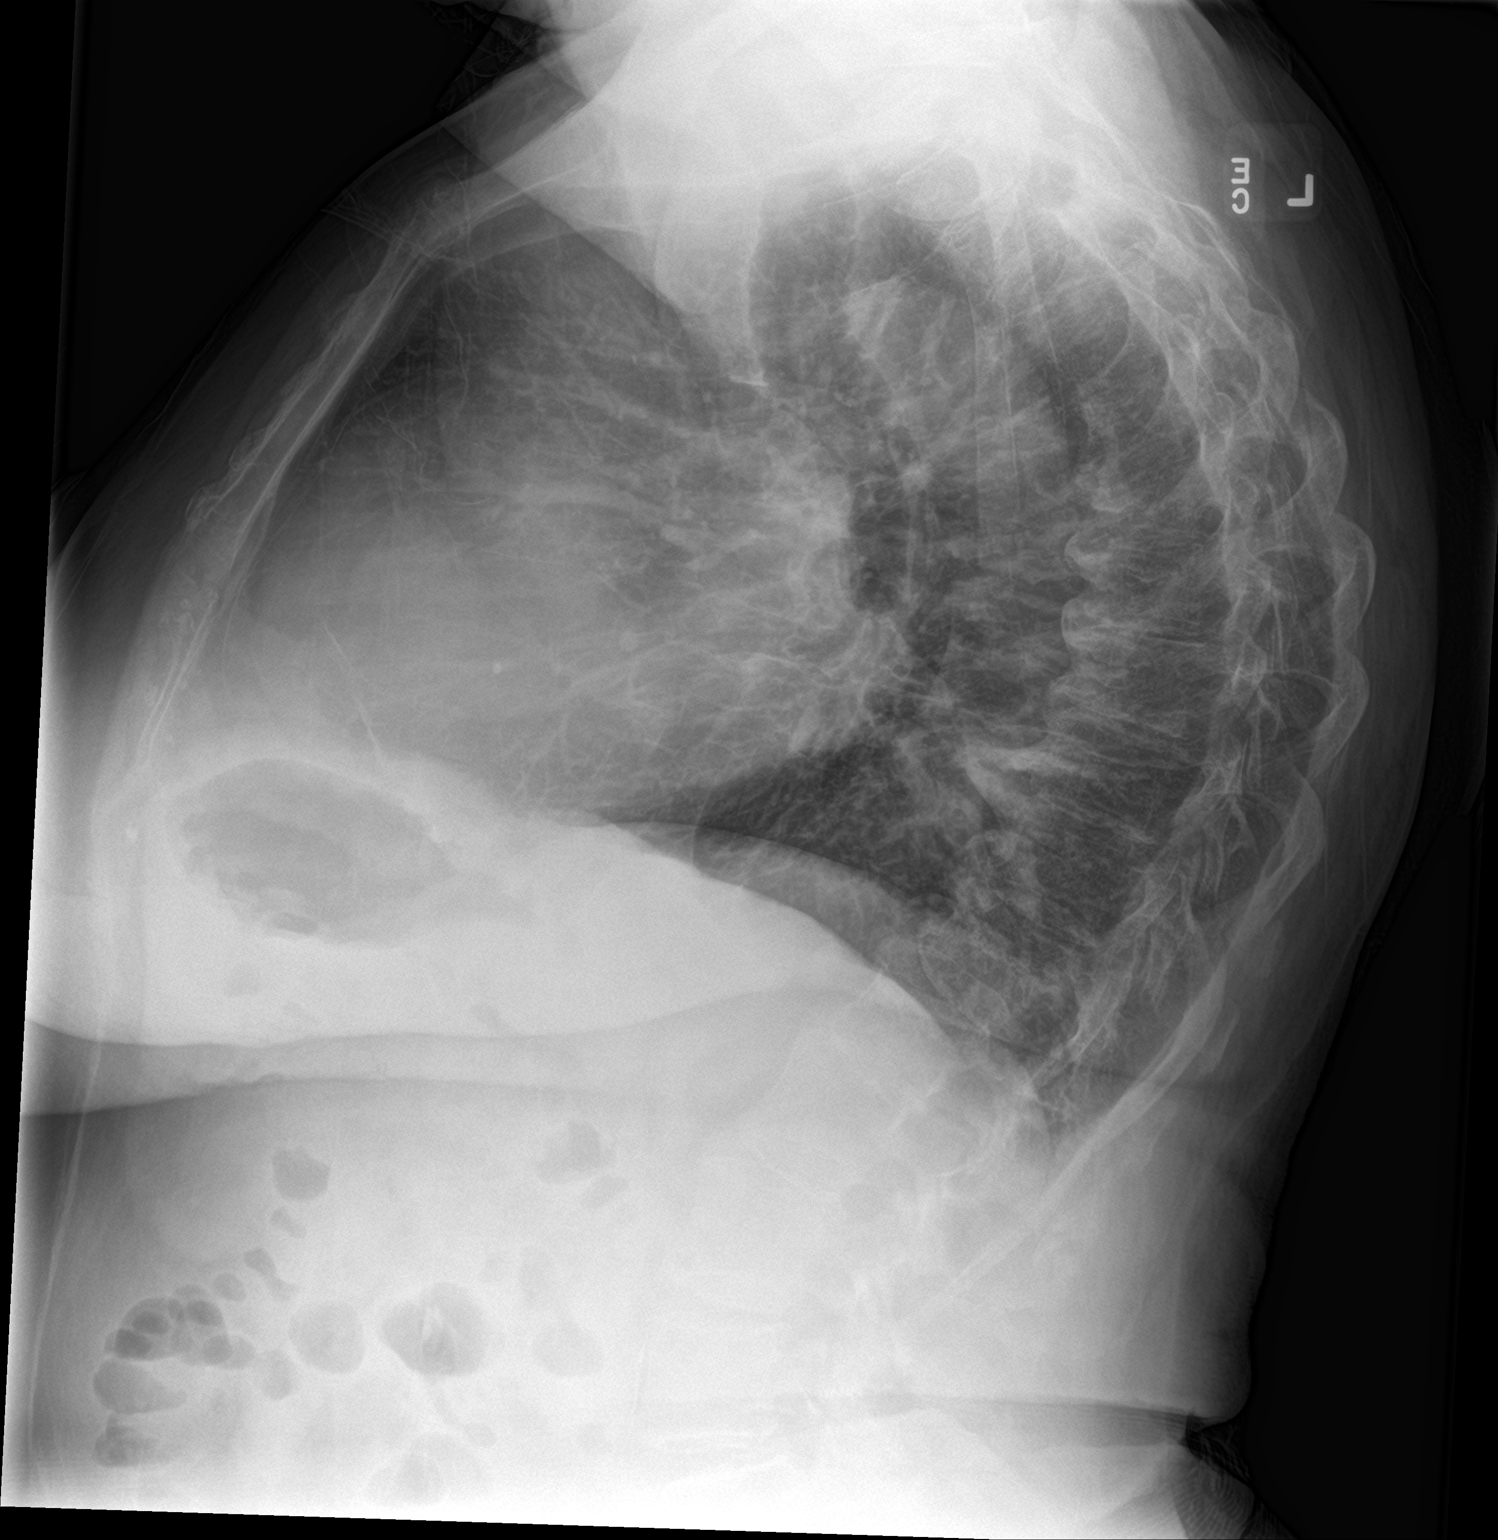

[2 of 2 positions shown; findings below may reference images not displayed]

FINDINGS: Unchanged heart size and mediastinal contours. Aortic
atherosclerosis. Streaky scarring at the left lung base, similar to
prior exam. No pleural fluid or evidence of focal airspace disease.
No pneumothorax. Scoliosis and multilevel degenerative change in the
spine. No obvious rib abnormality on these non dedicated rib views.
IMPRESSION: 1. No acute findings or explanation for left-sided pain.
2. Stable left basilar scarring.
3. Scoliosis.

## 2021-02-24 ENCOUNTER — Other Ambulatory Visit: Payer: Self-pay

## 2021-02-24 DIAGNOSIS — D472 Monoclonal gammopathy: Secondary | ICD-10-CM

## 2021-02-25 ENCOUNTER — Inpatient Hospital Stay: Payer: Medicare Other | Attending: Oncology

## 2021-02-25 DIAGNOSIS — N189 Chronic kidney disease, unspecified: Secondary | ICD-10-CM | POA: Diagnosis not present

## 2021-02-25 DIAGNOSIS — I129 Hypertensive chronic kidney disease with stage 1 through stage 4 chronic kidney disease, or unspecified chronic kidney disease: Secondary | ICD-10-CM | POA: Insufficient documentation

## 2021-02-25 DIAGNOSIS — D472 Monoclonal gammopathy: Secondary | ICD-10-CM | POA: Diagnosis not present

## 2021-02-25 LAB — COMPREHENSIVE METABOLIC PANEL
ALT: 9 U/L (ref 0–44)
AST: 19 U/L (ref 15–41)
Albumin: 4 g/dL (ref 3.5–5.0)
Alkaline Phosphatase: 90 U/L (ref 38–126)
Anion gap: 10 (ref 5–15)
BUN: 16 mg/dL (ref 8–23)
CO2: 25 mmol/L (ref 22–32)
Calcium: 9.7 mg/dL (ref 8.9–10.3)
Chloride: 103 mmol/L (ref 98–111)
Creatinine, Ser: 0.96 mg/dL (ref 0.44–1.00)
GFR, Estimated: 58 mL/min — ABNORMAL LOW (ref >60)
Glucose, Bld: 138 mg/dL — ABNORMAL HIGH (ref 70–99)
Potassium: 3.7 mmol/L (ref 3.5–5.1)
Sodium: 138 mmol/L (ref 135–145)
Total Bilirubin: 0.9 mg/dL (ref 0.3–1.2)
Total Protein: 7.5 g/dL (ref 6.5–8.1)

## 2021-02-25 LAB — CBC WITH DIFFERENTIAL/PLATELET
Abs Immature Granulocytes: 0.02 10*3/uL (ref 0.00–0.07)
Basophils Absolute: 0 10*3/uL (ref 0.0–0.1)
Basophils Relative: 0 %
Eosinophils Absolute: 0.1 10*3/uL (ref 0.0–0.5)
Eosinophils Relative: 2 %
HCT: 39.8 % (ref 36.0–46.0)
Hemoglobin: 13.4 g/dL (ref 12.0–15.0)
Immature Granulocytes: 0 %
Lymphocytes Relative: 25 %
Lymphs Abs: 1.5 10*3/uL (ref 0.7–4.0)
MCH: 30.9 pg (ref 26.0–34.0)
MCHC: 33.7 g/dL (ref 30.0–36.0)
MCV: 91.9 fL (ref 80.0–100.0)
Monocytes Absolute: 0.6 10*3/uL (ref 0.1–1.0)
Monocytes Relative: 10 %
Neutro Abs: 3.8 10*3/uL (ref 1.7–7.7)
Neutrophils Relative %: 63 %
Platelets: 157 10*3/uL (ref 150–400)
RBC: 4.33 MIL/uL (ref 3.87–5.11)
RDW: 13.9 % (ref 11.5–15.5)
WBC: 6.1 10*3/uL (ref 4.0–10.5)
nRBC: 0 % (ref 0.0–0.2)

## 2021-02-26 LAB — KAPPA/LAMBDA LIGHT CHAINS
Kappa free light chain: 28.9 mg/L — ABNORMAL HIGH (ref 3.3–19.4)
Kappa, lambda light chain ratio: 1.26 (ref 0.26–1.65)
Lambda free light chains: 23 mg/L (ref 5.7–26.3)

## 2021-03-01 LAB — MULTIPLE MYELOMA PANEL, SERUM
Albumin SerPl Elph-Mcnc: 3.7 g/dL (ref 2.9–4.4)
Albumin/Glob SerPl: 1.3 (ref 0.7–1.7)
Alpha 1: 0.2 g/dL (ref 0.0–0.4)
Alpha2 Glob SerPl Elph-Mcnc: 0.6 g/dL (ref 0.4–1.0)
B-Globulin SerPl Elph-Mcnc: 0.9 g/dL (ref 0.7–1.3)
Gamma Glob SerPl Elph-Mcnc: 1.2 g/dL (ref 0.4–1.8)
Globulin, Total: 3 g/dL (ref 2.2–3.9)
IgA: 220 mg/dL (ref 64–422)
IgG (Immunoglobin G), Serum: 1172 mg/dL (ref 586–1602)
IgM (Immunoglobulin M), Srm: 243 mg/dL — ABNORMAL HIGH (ref 26–217)
Total Protein ELP: 6.7 g/dL (ref 6.0–8.5)

## 2021-03-04 ENCOUNTER — Encounter: Payer: Self-pay | Admitting: Oncology

## 2021-03-04 ENCOUNTER — Inpatient Hospital Stay (HOSPITAL_BASED_OUTPATIENT_CLINIC_OR_DEPARTMENT_OTHER): Payer: Medicare Other | Admitting: Oncology

## 2021-03-04 VITALS — BP 111/72 | HR 57 | Temp 97.1°F | Wt 153.6 lb

## 2021-03-04 DIAGNOSIS — D472 Monoclonal gammopathy: Secondary | ICD-10-CM

## 2021-03-04 DIAGNOSIS — N189 Chronic kidney disease, unspecified: Secondary | ICD-10-CM | POA: Diagnosis not present

## 2021-03-04 DIAGNOSIS — I129 Hypertensive chronic kidney disease with stage 1 through stage 4 chronic kidney disease, or unspecified chronic kidney disease: Secondary | ICD-10-CM | POA: Diagnosis not present

## 2021-03-04 DIAGNOSIS — N183 Chronic kidney disease, stage 3 unspecified: Secondary | ICD-10-CM | POA: Diagnosis not present

## 2021-03-04 NOTE — Progress Notes (Signed)
Hematology/Oncology follow up note [facility] Telephone:([phone] [phone]   Patient Care Team: Venita Lick, NP as PCP - General (Nurse Practitioner) Marlowe Sax, MD as Referring Physician (Internal Medicine) Earlie Server, MD as Consulting Physician (Oncology)   REASON FOR VISIT Follow up for management of MGUS  HISTORY OF PRESENTING ILLNESS:  Cynthia Hurst is a  86 y.o.  female with PMH listed below who was referred to me for evaluation of hypercalcemia.  Patient was referred to see a see rheumatology for evaluation of positive ANA, joint pain.  Lab workup reviewed hypercalcemia of 11, normal PTH, normal 25-hydroxy vitamin D level, normal TSH, SPEP showed  monoclonal spike of 0.4.  Immunofixation showed elevated IgA monoclonal protein with kappa light chain specificity. Patient reports feeling well, she denies any fatigue, weight loss, back pain history of fracture.  She has bilateral knee replacement reports having posterior thigh pain bilaterally. Patient denies taking any calcium supplements.  Never smoker  # Bone marrow biopsy results was reviewed with patient and her family members in details.  Showed 2% plasma cells, consistent with MGUS  .INTERVAL HISTORY Cynthia Hurst is a 86 y.o. female who has above history reviewed by me today presents for follow-up visit for management of IgA MGUS. Was accompanied by daughter. Denies any new complaints. Feeling well.  Review of Systems  Constitutional: Negative for chills, fever, malaise/fatigue and weight loss.  HENT: Negative for sore throat.   Eyes: Negative for redness.  Respiratory: Negative for cough, shortness of breath and wheezing.   Cardiovascular: Negative for chest pain, palpitations and leg swelling.  Gastrointestinal: Negative for abdominal pain, blood in stool, nausea and vomiting.  Genitourinary: Negative for dysuria.  Musculoskeletal: Negative for myalgias.   Skin: Negative for rash.  Neurological: Negative for dizziness, tingling and tremors.  Endo/Heme/Allergies: Does not bruise/bleed easily.  Psychiatric/Behavioral: Negative for hallucinations.    MEDICAL HISTORY:  Past Medical History:  Diagnosis Date  . Chronic kidney disease   . GERD (gastroesophageal reflux disease)   . Gout   . Hypertension   . Hypopotassemia   . Osteoarthritis   . Osteoporosis    LUMBAR SPINE  . Vitamin B 12 deficiency   . Vitamin D deficiency     SURGICAL HISTORY: Past Surgical History:  Procedure Laterality Date  . ABDOMINAL HYSTERECTOMY     PARTIAL  . EYE SURGERY Left 05/2018   cataract surgery   . JOINT REPLACEMENT Bilateral 2010,2011   KNEE    SOCIAL HISTORY: Social History   Socioeconomic History  . Marital status: Married    Spouse name: Not on file  . Number of children: Not on file  . Years of education: Not on file  . Highest education level: High school graduate  Occupational History  . Not on file  Tobacco Use  . Smoking status: Never Smoker  . Smokeless tobacco: Never Used  Vaping Use  . Vaping Use: Never used  Substance and Sexual Activity  . Alcohol use: No  . Drug use: No  . Sexual activity: Yes  Other Topics Concern  . Not on file  Social History Narrative  . Not on file   Social Determinants of Health   Financial Resource Strain: Low Risk   . Difficulty of Paying Living Expenses: Not hard at all  Food Insecurity: No Food Insecurity  . Worried About Charity fundraiser in the Last Year: Never true  . Ran Out of Food in the Last  Year: Never true  Transportation Needs: No Transportation Needs  . Lack of Transportation (Medical): No  . Lack of Transportation (Non-Medical): No  Physical Activity: Inactive  . Days of Exercise per Week: 0 days  . Minutes of Exercise per Session: 0 min  Stress: No Stress Concern Present  . Feeling of Stress : Not at all  Social Connections: Not on file  Intimate Partner  Violence: Not on file    FAMILY HISTORY: Family History  Problem Relation Age of Onset  . Heart disease Mother   . Cancer Father   . Alzheimer's disease Father   . Breast cancer Neg Hx     ALLERGIES:  has No Known Allergies.  MEDICATIONS:  Current Outpatient Medications  Medication Sig Dispense Refill  . allopurinol (ZYLOPRIM) 100 MG tablet TAKE 1 TABLET DAILY 90 tablet 0  . atenolol (TENORMIN) 100 MG tablet TAKE 1 TABLET DAILY 90 tablet 1  . cholecalciferol (VITAMIN D) 1000 UNITS tablet Take 1,000 Units by mouth daily.    . cyanocobalamin 2000 MCG tablet Take 2,000 mcg by mouth daily.    . Diclofenac Sodium 3 % GEL Apply 1 application topically 2 (two) times daily as needed (for back pain). 100 g 0  . KLOR-CON M20 20 MEQ tablet TAKE 1 TABLET DAILY 90 tablet 3  . NIFEdipine (ADALAT CC) 60 MG 24 hr tablet TAKE 1 TABLET DAILY 90 tablet 1  . omeprazole (PRILOSEC) 20 MG capsule TAKE 1 CAPSULE DAILY 90 capsule 2  . raloxifene (EVISTA) 60 MG tablet TAKE 1 TABLET DAILY 90 tablet 2  . indomethacin (INDOCIN) 50 MG capsule Take 1 capsule (50 mg total) by mouth 2 (two) times daily with a meal. (Patient not taking: Reported on 03/04/2021) 60 capsule 0   No current facility-administered medications for this visit.     PHYSICAL EXAMINATION: ECOG PERFORMANCE STATUS: 0 - Asymptomatic Vitals:   03/04/21 1002  BP: 111/72  Pulse: (!) 57  Temp: (!) 97.1 F (36.2 C)   Filed Weights   03/04/21 1002  Weight: 153 lb 9.6 oz (69.7 kg)    Physical Exam Constitutional:      General: She is not in acute distress. HENT:     Head: Normocephalic and atraumatic.  Eyes:     General: No scleral icterus.    Pupils: Pupils are equal, round, and reactive to light.  Cardiovascular:     Rate and Rhythm: Normal rate and regular rhythm.     Heart sounds: Normal heart sounds.  Pulmonary:     Effort: Pulmonary effort is normal. No respiratory distress.     Breath sounds: Normal breath sounds. No  wheezing or rales.  Chest:     Chest wall: No tenderness.  Abdominal:     General: Bowel sounds are normal. There is no distension.     Palpations: Abdomen is soft. There is no mass.     Tenderness: There is no abdominal tenderness.  Musculoskeletal:        General: No deformity. Normal range of motion.     Cervical back: Normal range of motion and neck supple.  Lymphadenopathy:     Cervical: No cervical adenopathy.  Skin:    General: Skin is warm and dry.     Findings: No erythema or rash.  Neurological:     Mental Status: She is alert and oriented to person, place, and time. Mental status is at baseline.     Cranial Nerves: No cranial nerve deficit.  Psychiatric:  Mood and Affect: Mood normal.      LABORATORY DATA:  I have reviewed the data as listed Lab Results  Component Value Date   WBC 6.1 02/25/2021   HGB 13.4 02/25/2021   HCT 39.8 02/25/2021   MCV 91.9 02/25/2021   PLT 157 02/25/2021   Recent Labs    05/24/20 2226 07/28/20 0941 08/12/20 1309 02/25/21 1122  NA 140 143 139 138  K 4.6 4.4 4.2 3.7  CL 106 105 104 103  CO2 _0 GLUCOSE 96 80 100* 138*  BUN _1 CREATININE 1.11* 0.97 1.17* 0.96  CALCIUM 9.5 9.7 9.2 9.7  GFRNONAA 46* 54* 43* 58*  GFRAA 53* 62 50*  --   PROT 7.1 6.7 7.7 7.5  ALBUMIN 3.9 4.1 4.1 4.0  AST _2 ALT _3 ALKPHOS 102 110 104 90  BILITOT 0.6 0.7 0.7 0.9    03/12/2018 multiple myeloma work up  [facility] clinic: SPEP showed  monoclonal spike of 0.4.  Immunofixation showed elevated IgA monoclonal protein with kappa light chain specificity.Urine light chain ratio elevated at 12.54, no monoclonal spike on UPEP.   Lab Results  Component Value Date   TOTALPROTELP 6.7 02/25/2021   ALBUMINELP 3.6 10/19/2018   A1GS 0.3 10/19/2018   A2GS 0.6 10/19/2018   BETS 0.9 10/19/2018   GAMS 1.2 10/19/2018   MSPIKE Not Observed 10/19/2018   SPEI Comment 10/19/2018   Lab Results  Component Value Date    KPAFRELGTCHN 28.9 (H) 02/25/2021   LAMBDASER 23.0 02/25/2021   KAPLAMBRATIO 1.26 02/25/2021    ASSESSMENT & PLAN:  1. MGUS (monoclonal gammopathy of unknown significance)   2. Stage 3 chronic kidney disease, unspecified whether stage 3a or 3b CKD (HCC)    IgA MGUS with kappa light chain restriction. Labs are reviewed and discussed with patient. Normal light chain ratio, nondetectable M protein. No anemia, renal insufficiency Recommend observation. Follow-up in 1 year with repeat CBC, CMP, multiple myeloma panel maturation 1 week prior to the visit. # CKD, avoid nephrotoxins.  All questions were answered. The patient knows to call the clinic with any problems questions or concerns.    Earlie Server, MD, PhD Hematology Oncology [facility] Pager- [phone] 03/04/2021

## 2021-03-22 ENCOUNTER — Other Ambulatory Visit: Payer: Self-pay | Admitting: Nurse Practitioner

## 2021-03-22 NOTE — Telephone Encounter (Signed)
Requested Prescriptions  Pending Prescriptions Disp Refills  . atenolol (TENORMIN) 100 MG tablet [Pharmacy Med Name: ATENOLOL TABS 100MG ] 90 tablet 3    Sig: TAKE 1 TABLET DAILY     Cardiovascular:  Beta Blockers Passed - 03/22/2021 12:41 AM      Passed - Last BP in normal range    BP Readings from Last 1 Encounters:  03/04/21 111/72         Passed - Last Heart Rate in normal range    Pulse Readings from Last 1 Encounters:  03/04/21 (!) 25         Passed - Valid encounter within last 6 months    Recent Outpatient Visits          5 months ago Stage 3a chronic kidney disease ([place])   [place], B and E T, NP   7 months ago Gout, unspecified cause, unspecified chronicity, unspecified site   [facility], [place], [place]   9 months ago Chronic left-sided low back pain without sciatica   [place], Barbaraann Faster, NP   1 year ago MGUS (monoclonal gammopathy of unknown significance)   [place], [place] T, NP   1 year ago Chronic left-sided low back pain without sciatica   [facility] Eulogio Bear, NP      Future Appointments            In 1 week [name], Barbaraann Faster, NP [facility]   In 4 months  [facility]

## 2021-03-26 ENCOUNTER — Encounter: Payer: Self-pay | Admitting: Nurse Practitioner

## 2021-03-26 DIAGNOSIS — I700 Atherosclerosis of aorta: Secondary | ICD-10-CM | POA: Insufficient documentation

## 2021-04-01 ENCOUNTER — Ambulatory Visit: Payer: Medicare Other | Admitting: Nurse Practitioner

## 2021-04-01 ENCOUNTER — Other Ambulatory Visit: Payer: Self-pay

## 2021-04-13 ENCOUNTER — Telehealth: Payer: Medicare Other | Admitting: Nurse Practitioner

## 2021-04-14 ENCOUNTER — Encounter: Payer: Self-pay | Admitting: Nurse Practitioner

## 2021-04-14 ENCOUNTER — Other Ambulatory Visit: Payer: Self-pay

## 2021-04-14 ENCOUNTER — Ambulatory Visit (INDEPENDENT_AMBULATORY_CARE_PROVIDER_SITE_OTHER): Payer: Medicare Other | Admitting: Nurse Practitioner

## 2021-04-14 VITALS — BP 112/63 | HR 55 | Temp 98.7°F | Wt 152.4 lb

## 2021-04-14 DIAGNOSIS — E559 Vitamin D deficiency, unspecified: Secondary | ICD-10-CM

## 2021-04-14 DIAGNOSIS — D472 Monoclonal gammopathy: Secondary | ICD-10-CM | POA: Diagnosis not present

## 2021-04-14 DIAGNOSIS — I129 Hypertensive chronic kidney disease with stage 1 through stage 4 chronic kidney disease, or unspecified chronic kidney disease: Secondary | ICD-10-CM | POA: Diagnosis not present

## 2021-04-14 DIAGNOSIS — I700 Atherosclerosis of aorta: Secondary | ICD-10-CM

## 2021-04-14 DIAGNOSIS — M8589 Other specified disorders of bone density and structure, multiple sites: Secondary | ICD-10-CM | POA: Diagnosis not present

## 2021-04-14 DIAGNOSIS — E538 Deficiency of other specified B group vitamins: Secondary | ICD-10-CM

## 2021-04-14 DIAGNOSIS — K219 Gastro-esophageal reflux disease without esophagitis: Secondary | ICD-10-CM | POA: Diagnosis not present

## 2021-04-14 DIAGNOSIS — N1831 Chronic kidney disease, stage 3a: Secondary | ICD-10-CM

## 2021-04-14 DIAGNOSIS — R7301 Impaired fasting glucose: Secondary | ICD-10-CM

## 2021-04-14 NOTE — Assessment & Plan Note (Signed)
Chronic with CKD 3a.  Monitor CMP today and refer to nephrology as needed if worsening CKD.  Renal dose medications as needed.

## 2021-04-14 NOTE — Assessment & Plan Note (Signed)
Present to lumbar and thoracic spine.  Continue daily Vit D3 and Calcium supplements + Evista.  Recommend continued daily PT exercises for strengthening at home.  Check Vit D.

## 2021-04-14 NOTE — Assessment & Plan Note (Signed)
Chronic, stable, minimally used Prilosec.  Continue current regimen on as needed basis and diet focus. 

## 2021-04-14 NOTE — Assessment & Plan Note (Signed)
Chronic, ongoing.  BP at goal in office today for age.  Continue current medication regimen and adjust as needed.  CMP and TSH today.  Renal dose medications as needed.  Recommend she continue to monitor BP at home at least a few days a week and document + focus on DASH diet.  Return in 6 months.

## 2021-04-14 NOTE — Assessment & Plan Note (Signed)
Chronic, ongoing.  Continue collaboration with oncology. 

## 2021-04-14 NOTE — Patient Instructions (Signed)

## 2021-04-14 NOTE — Assessment & Plan Note (Signed)
Chronic, stable.  Continue daily supplement and adjust as needed.  Obtain B12 level. 

## 2021-04-14 NOTE — Assessment & Plan Note (Signed)
Chronic, stable.  Continue daily supplement and adjust as needed.  Labs today. 

## 2021-04-14 NOTE — Assessment & Plan Note (Signed)
Noted on imaging on 03/22/2018.  At this time will not initiate statin due to advanced and patient wishes.  Recommend daily Baby ASA 81 MG for prevention.

## 2021-04-14 NOTE — Progress Notes (Signed)
BP 112/63   Pulse (!) 55   Temp 98.7 F (37.1 C) (Oral)   Wt 152 lb 6.4 oz (69.1 kg)   LMP  (LMP Unknown)   SpO2 98%   BMI 25.36 kg/m    Subjective:    Patient ID: [name], female    DOB: 01/16/1935, 86 y.o.   MRN: [id number]  HPI: Cynthia Hurst is a 86 y.o. female  Chief Complaint  Patient presents with  . Hypertension  . Chronic Kidney Disease   HYPERTENSION Continues on Nifedipine and Atenolol.  Aortic atherosclerosis noted on imaging 03/22/2018. Satisfied with current treatment? yes Duration of hypertension: chronic BP monitoring frequency: monthly BP range: unsure BP medication side effects: no Aspirin: no Recent stressors: no Recurrent headaches: no Visual changes: no Palpitations: no Dyspnea: no Chest pain: no Lower extremity edema: no Dizzy/lightheaded: no   CHRONIC KIDNEY DISEASE Last kidney function on 02/25/21 0.96 and GFR 58. CKD status: controlled Medications renally dose: yes Previous renal evaluation: no Pneumovax:  refused Influenza Vaccine:  Refused  GERD Continues on Omeprazole as needed.  Very seldom uses. GERD control status: stable  Satisfied with current treatment? yes Heartburn frequency: occasional Medication side effects: no  Medication compliance: stable Dysphagia: no Odynophagia:  no Hematemesis: no Blood in stool: no EGD: no  GOUT Continues on Allopurinol 100 MG -- renal dosed.  Had a flare last 08/03/20.  She continues on B12 and Vitamin D3. Duration:chronic Swelling: no Redness: no Trauma: no Recent dietary change or indiscretion: no Fevers: no Nausea/vomiting: no Aggravating factors: Alleviating factors:  Status:  stable Treatments attempted:  MGUS: Saw Dr. Tasia Catchings on 03/04/21.  Had renal ultrasound on 02/14/2020 with overall normal findings, right-sided renal cysts measuring 1.8 cm and questionable mild bladder wall thickening. She is to return to oncology in 6 months for follow-up.  Denies any symptoms at  this time.  Relevant past medical, surgical, family and social history reviewed and updated as indicated. Interim medical history since our last visit reviewed. Allergies and medications reviewed and updated.  Review of Systems  Constitutional: Negative for activity change, appetite change, diaphoresis, fatigue and fever.  Respiratory: Negative for cough, chest tightness and shortness of breath.   Cardiovascular: Negative for chest pain, palpitations and leg swelling.  Gastrointestinal: Negative.   Musculoskeletal: Negative for back pain.  Neurological: Negative.   Psychiatric/Behavioral: Negative.     Per HPI unless specifically indicated above     Objective:    BP 112/63   Pulse (!) 55   Temp 98.7 F (37.1 C) (Oral)   Wt 152 lb 6.4 oz (69.1 kg)   LMP  (LMP Unknown)   SpO2 98%   BMI 25.36 kg/m   Wt Readings from Last 3 Encounters:  04/14/21 152 lb 6.4 oz (69.1 kg)  03/04/21 153 lb 9.6 oz (69.7 kg)  10/01/20 160 lb (72.6 kg)    Physical Exam Vitals and nursing note reviewed.  Constitutional:      General: She is awake. She is not in acute distress.    Appearance: She is well-developed and well-groomed. She is not ill-appearing.  HENT:     Head: Normocephalic.     Right Ear: Hearing normal.     Left Ear: Hearing normal.  Eyes:     General: Lids are normal.        Right eye: No discharge.        Left eye: No discharge.     Conjunctiva/sclera: Conjunctivae normal.  Pupils: Pupils are equal, round, and reactive to light.  Neck:     Thyroid: No thyromegaly.     Vascular: No carotid bruit.  Cardiovascular:     Rate and Rhythm: Normal rate and regular rhythm.     Heart sounds: Normal heart sounds. No murmur heard. No gallop.   Pulmonary:     Effort: Pulmonary effort is normal. No accessory muscle usage or respiratory distress.     Breath sounds: Normal breath sounds.  Abdominal:     General: Bowel sounds are normal.     Palpations: Abdomen is soft.      Tenderness: There is no abdominal tenderness.  Musculoskeletal:     Cervical back: Normal range of motion and neck supple.     Right lower leg: No edema.     Left lower leg: No edema.     Comments: Kyphosis present.  Skin:    General: Skin is warm and dry.  Neurological:     Mental Status: She is alert and oriented to person, place, and time.  Psychiatric:        Attention and Perception: Attention normal.        Mood and Affect: Mood normal.        Speech: Speech normal.        Behavior: Behavior normal. Behavior is cooperative.        Thought Content: Thought content normal.     Results for orders placed or performed in visit on 02/25/21  Multiple Myeloma Panel (SPEP&IFE w/QIG)  Result Value Ref Range   IgG (Immunoglobin G), Serum 1,172 586 - 1,602 mg/dL   IgA 220 64 - 422 mg/dL   IgM (Immunoglobulin M), Srm 243 (H) 26 - 217 mg/dL   Total Protein ELP 6.7 6.0 - 8.5 g/dL   Albumin SerPl Elph-Mcnc 3.7 2.9 - 4.4 g/dL   Alpha 1 0.2 0.0 - 0.4 g/dL   Alpha2 Glob SerPl Elph-Mcnc 0.6 0.4 - 1.0 g/dL   B-Globulin SerPl Elph-Mcnc 0.9 0.7 - 1.3 g/dL   Gamma Glob SerPl Elph-Mcnc 1.2 0.4 - 1.8 g/dL   M Protein SerPl Elph-Mcnc Not Observed Not Observed g/dL   Globulin, Total 3.0 2.2 - 3.9 g/dL   Albumin/Glob SerPl 1.3 0.7 - 1.7   IFE 1 Comment (A)    Please Note Comment   Comprehensive metabolic panel  Result Value Ref Range   Sodium 138 135 - 145 mmol/L   Potassium 3.7 3.5 - 5.1 mmol/L   Chloride 103 98 - 111 mmol/L   CO2 25 22 - 32 mmol/L   Glucose, Bld 138 (H) 70 - 99 mg/dL   BUN 16 8 - 23 mg/dL   Creatinine, Ser 0.96 0.44 - 1.00 mg/dL   Calcium 9.7 8.9 - 10.3 mg/dL   Total Protein 7.5 6.5 - 8.1 g/dL   Albumin 4.0 3.5 - 5.0 g/dL   AST 19 15 - 41 U/L   ALT 9 0 - 44 U/L   Alkaline Phosphatase 90 38 - 126 U/L   Total Bilirubin 0.9 0.3 - 1.2 mg/dL   GFR, Estimated 58 (L) >60 mL/min   Anion gap 10 5 - 15  CBC with Differential/Platelet  Result Value Ref Range   WBC 6.1 4.0 -  10.5 K/uL   RBC 4.33 3.87 - 5.11 MIL/uL   Hemoglobin 13.4 12.0 - 15.0 g/dL   HCT 39.8 36.0 - 46.0 %   MCV 91.9 80.0 - 100.0 fL   MCH 30.9 26.0 - 34.0  pg   MCHC 33.7 30.0 - 36.0 g/dL   RDW 13.9 11.5 - 15.5 %   Platelets 157 150 - 400 K/uL   nRBC 0.0 0.0 - 0.2 %   Neutrophils Relative % 63 %   Neutro Abs 3.8 1.7 - 7.7 K/uL   Lymphocytes Relative 25 %   Lymphs Abs 1.5 0.7 - 4.0 K/uL   Monocytes Relative 10 %   Monocytes Absolute 0.6 0.1 - 1.0 K/uL   Eosinophils Relative 2 %   Eosinophils Absolute 0.1 0.0 - 0.5 K/uL   Basophils Relative 0 %   Basophils Absolute 0.0 0.0 - 0.1 K/uL   Immature Granulocytes 0 %   Abs Immature Granulocytes 0.02 0.00 - 0.07 K/uL  Kappa/lambda light chains  Result Value Ref Range   Kappa free light chain 28.9 (H) 3.3 - 19.4 mg/L   Lamda free light chains 23.0 5.7 - 26.3 mg/L   Kappa, lamda light chain ratio 1.26 0.26 - 1.65      Assessment & Plan:   Problem List Items Addressed This Visit      Cardiovascular and Mediastinum   Benign hypertension with chronic kidney disease    Chronic, ongoing.  BP at goal in office today for age.  Continue current medication regimen and adjust as needed.  CMP and TSH today.  Renal dose medications as needed.  Recommend she continue to monitor BP at home at least a few days a week and document + focus on DASH diet.  Return in 6 months.      Relevant Orders   Basic metabolic panel   TSH   Aortic atherosclerosis ([place]) - Primary    Noted on imaging on 03/22/2018.  At this time will not initiate statin due to advanced and patient wishes.  Recommend daily Baby ASA 81 MG for prevention.        Digestive   GERD (gastroesophageal reflux disease)    Chronic, stable, minimally used Prilosec.  Continue current regimen on as needed basis and diet focus.        Musculoskeletal and Integument   Osteopenia    Present to lumbar and thoracic spine.  Continue daily Vit D3 and Calcium supplements + Evista.  Recommend continued  daily PT exercises for strengthening at home.  Check Vit D.        Genitourinary   CKD (chronic kidney disease), stage III (HCC)    Chronic with CKD 3a.  Monitor CMP today and refer to nephrology as needed if worsening CKD.  Renal dose medications as needed.          Other   Vitamin B12 deficiency    Chronic, stable.  Continue daily supplement and adjust as needed.  Obtain B12 level.      Relevant Orders   Vitamin B12   Vitamin D deficiency    Chronic, stable.  Continue daily supplement and adjust as needed.  Labs today.      Relevant Orders   VITAMIN D 25 Hydroxy (Vit-D Deficiency, Fractures)   MGUS (monoclonal gammopathy of unknown significance)    Chronic, ongoing.  Continue collaboration with oncology.       Other Visit Diagnoses    IFG (impaired fasting glucose)       A1c on labs today due to recent elevation in glucose.   Relevant Orders   HgB A1c       Follow up plan: Return in about 6 months (around 10/15/2021).

## 2021-04-15 LAB — TSH: TSH: 3.17 u[IU]/mL (ref 0.450–4.500)

## 2021-04-15 LAB — VITAMIN B12: Vitamin B-12: >2000 pg/mL — ABNORMAL HIGH (ref 232–1245)

## 2021-04-15 LAB — BASIC METABOLIC PANEL
BUN/Creatinine Ratio: 16 (ref 12–28)
BUN: 17 mg/dL (ref 8–27)
CO2: 22 mmol/L (ref 20–29)
Calcium: 10.2 mg/dL (ref 8.7–10.3)
Chloride: 103 mmol/L (ref 96–106)
Creatinine, Ser: 1.07 mg/dL — ABNORMAL HIGH (ref 0.57–1.00)
Glucose: 89 mg/dL (ref 65–99)
Potassium: 4.5 mmol/L (ref 3.5–5.2)
Sodium: 142 mmol/L (ref 134–144)
eGFR: 51 mL/min/{1.73_m2} — ABNORMAL LOW (ref >59)

## 2021-04-15 LAB — HEMOGLOBIN A1C
Est. average glucose Bld gHb Est-mCnc: 111 mg/dL
Hgb A1c MFr Bld: 5.5 % (ref 4.8–5.6)

## 2021-04-15 LAB — VITAMIN D 25 HYDROXY (VIT D DEFICIENCY, FRACTURES): Vit D, 25-Hydroxy: 28 ng/mL — ABNORMAL LOW (ref 30.0–100.0)

## 2021-04-15 NOTE — Progress Notes (Signed)
Good morning, please let Cynthia Hurst know her labs have returned: - A1c shows no prediabetes or diabetes -- great news!! - Kidney function shows some mild kidney disease, this is something we will continue to monitor, no changes needed.  Ensure you drink plenty of water at home and decrease salt intake. - Thyroid is normal.  B12 level elevated, I would recommend changing to taking B12 every other day. - Vitamin D level remains a little on low side, recommend increasing Vitamin D3 to 2000 units daily. Any questions? Keep being awesome!!  Thank you for allowing me to participate in your care. Kindest regards, Delrae Hagey

## 2021-04-19 ENCOUNTER — Other Ambulatory Visit: Payer: Self-pay | Admitting: Nurse Practitioner

## 2021-04-26 ENCOUNTER — Other Ambulatory Visit: Payer: Self-pay | Admitting: Nurse Practitioner

## 2021-05-27 DIAGNOSIS — Z20822 Contact with and (suspected) exposure to covid-19: Secondary | ICD-10-CM | POA: Diagnosis not present

## 2021-07-28 ENCOUNTER — Ambulatory Visit: Payer: Medicare Other

## 2021-08-02 ENCOUNTER — Ambulatory Visit (INDEPENDENT_AMBULATORY_CARE_PROVIDER_SITE_OTHER): Payer: Medicare Other

## 2021-08-02 ENCOUNTER — Other Ambulatory Visit: Payer: Self-pay | Admitting: Nurse Practitioner

## 2021-08-02 VITALS — Ht 65.0 in | Wt 154.0 lb

## 2021-08-02 DIAGNOSIS — Z0000 Encounter for general adult medical examination without abnormal findings: Secondary | ICD-10-CM | POA: Diagnosis not present

## 2021-08-02 NOTE — Patient Instructions (Signed)
Cynthia Hurst , Thank you for taking time to come for your Medicare Wellness Visit. I appreciate your ongoing commitment to your health goals. Please review the following plan we discussed and let me know if I can assist you in the future.   Screening recommendations/referrals: Colonoscopy: not required Mammogram: not required Bone Density: completed 05/25/2010 Recommended yearly ophthalmology/optometry visit for glaucoma screening and checkup Recommended yearly dental visit for hygiene and checkup  Vaccinations: Influenza vaccine: due Pneumococcal vaccine: decline Tdap vaccine: due Shingles vaccine: discussed   Covid-19:11/24/2020, 02/21/2020, 01/24/2020  Advanced directives: Please bring a copy of your POA (Power of Attorney) and/or Living Will to your next appointment.   Conditions/risks identified: none  Next appointment: Follow up in one year for your annual wellness visit    Preventive Care 65 Years and Older, Female Preventive care refers to lifestyle choices and visits with your health care provider that can promote health and wellness. What does preventive care include? A yearly physical exam. This is also called an annual well check. Dental exams once or twice a year. Routine eye exams. Ask your health care provider how often you should have your eyes checked. Personal lifestyle choices, including: Daily care of your teeth and gums. Regular physical activity. Eating a healthy diet. Avoiding tobacco and drug use. Limiting alcohol use. Practicing safe sex. Taking low-dose aspirin every day. Taking vitamin and mineral supplements as recommended by your health care provider. What happens during an annual well check? The services and screenings done by your health care provider during your annual well check will depend on your age, overall health, lifestyle risk factors, and family history of disease. Counseling  Your health care provider may ask you questions about  your: Alcohol use. Tobacco use. Drug use. Emotional well-being. Home and relationship well-being. Sexual activity. Eating habits. History of falls. Memory and ability to understand (cognition). Work and work Statistician. Reproductive health. Screening  You may have the following tests or measurements: Height, weight, and BMI. Blood pressure. Lipid and cholesterol levels. These may be checked every 5 years, or more frequently if you are over 60 years old. Skin check. Lung cancer screening. You may have this screening every year starting at age 42 if you have a 30-pack-year history of smoking and currently smoke or have quit within the past 15 years. Fecal occult blood test (FOBT) of the stool. You may have this test every year starting at age 15. Flexible sigmoidoscopy or colonoscopy. You may have a sigmoidoscopy every 5 years or a colonoscopy every 10 years starting at age 41. Hepatitis C blood test. Hepatitis B blood test. Sexually transmitted disease (STD) testing. Diabetes screening. This is done by checking your blood sugar (glucose) after you have not eaten for a while (fasting). You may have this done every 1-3 years. Bone density scan. This is done to screen for osteoporosis. You may have this done starting at age 18. Mammogram. This may be done every 1-2 years. Talk to your health care provider about how often you should have regular mammograms. Talk with your health care provider about your test results, treatment options, and if necessary, the need for more tests. Vaccines  Your health care provider may recommend certain vaccines, such as: Influenza vaccine. This is recommended every year. Tetanus, diphtheria, and acellular pertussis (Tdap, Td) vaccine. You may need a Td booster every 10 years. Zoster vaccine. You may need this after age 16. Pneumococcal 13-valent conjugate (PCV13) vaccine. One dose is recommended after age 1. Pneumococcal  polysaccharide (PPSV23) vaccine.  One dose is recommended after age 84. Talk to your health care provider about which screenings and vaccines you need and how often you need them. This information is not intended to replace advice given to you by your health care provider. Make sure you discuss any questions you have with your health care provider. Document Released: 12/25/2015 Document Revised: 08/17/2016 Document Reviewed: 09/29/2015 Elsevier Interactive Patient Education  2017 [place] Prevention in the Home Falls can cause injuries. They can happen to people of all ages. There are many things you can do to make your home safe and to help prevent falls. What can I do on the outside of my home? Regularly fix the edges of walkways and driveways and fix any cracks. Remove anything that might make you trip as you walk through a door, such as a raised step or threshold. Trim any bushes or trees on the path to your home. Use bright outdoor lighting. Clear any walking paths of anything that might make someone trip, such as rocks or tools. Regularly check to see if handrails are loose or broken. Make sure that both sides of any steps have handrails. Any raised decks and porches should have guardrails on the edges. Have any leaves, snow, or ice cleared regularly. Use sand or salt on walking paths during winter. Clean up any spills in your garage right away. This includes oil or grease spills. What can I do in the bathroom? Use night lights. Install grab bars by the toilet and in the tub and shower. Do not use towel bars as grab bars. Use non-skid mats or decals in the tub or shower. If you need to sit down in the shower, use a plastic, non-slip stool. Keep the floor dry. Clean up any water that spills on the floor as soon as it happens. Remove soap buildup in the tub or shower regularly. Attach bath mats securely with double-sided non-slip rug tape. Do not have throw rugs and other things on the floor that can make  you trip. What can I do in the bedroom? Use night lights. Make sure that you have a light by your bed that is easy to reach. Do not use any sheets or blankets that are too big for your bed. They should not hang down onto the floor. Have a firm chair that has side arms. You can use this for support while you get dressed. Do not have throw rugs and other things on the floor that can make you trip. What can I do in the kitchen? Clean up any spills right away. Avoid walking on wet floors. Keep items that you use a lot in easy-to-reach places. If you need to reach something above you, use a strong step stool that has a grab bar. Keep electrical cords out of the way. Do not use floor polish or wax that makes floors slippery. If you must use wax, use non-skid floor wax. Do not have throw rugs and other things on the floor that can make you trip. What can I do with my stairs? Do not leave any items on the stairs. Make sure that there are handrails on both sides of the stairs and use them. Fix handrails that are broken or loose. Make sure that handrails are as long as the stairways. Check any carpeting to make sure that it is firmly attached to the stairs. Fix any carpet that is loose or worn. Avoid having throw rugs at the top  or bottom of the stairs. If you do have throw rugs, attach them to the floor with carpet tape. Make sure that you have a light switch at the top of the stairs and the bottom of the stairs. If you do not have them, ask someone to add them for you. What else can I do to help prevent falls? Wear shoes that: Do not have high heels. Have rubber bottoms. Are comfortable and fit you well. Are closed at the toe. Do not wear sandals. If you use a stepladder: Make sure that it is fully opened. Do not climb a closed stepladder. Make sure that both sides of the stepladder are locked into place. Ask someone to hold it for you, if possible. Clearly mark and make sure that you can  see: Any grab bars or handrails. First and last steps. Where the edge of each step is. Use tools that help you move around (mobility aids) if they are needed. These include: Canes. Walkers. Scooters. Crutches. Turn on the lights when you go into a dark area. Replace any light bulbs as soon as they burn out. Set up your furniture so you have a clear path. Avoid moving your furniture around. If any of your floors are uneven, fix them. If there are any pets around you, be aware of where they are. Review your medicines with your doctor. Some medicines can make you feel dizzy. This can increase your chance of falling. Ask your doctor what other things that you can do to help prevent falls. This information is not intended to replace advice given to you by your health care provider. Make sure you discuss any questions you have with your health care provider. Document Released: 09/24/2009 Document Revised: 05/05/2016 Document Reviewed: 01/02/2015 Elsevier Interactive Patient Education  2017 [employer].

## 2021-08-02 NOTE — Progress Notes (Signed)
I connected with Cynthia Hurst today by telephone and verified that I am speaking with the correct person using two identifiers. Location patient: home Location provider: work Persons participating in the virtual visit: Cynthia Hurst, [name].   I discussed the limitations, risks, security and privacy concerns of performing an evaluation and management service by telephone and the availability of in person appointments. I also discussed with the patient that there may be a patient responsible charge related to this service. The patient expressed understanding and verbally consented to this telephonic visit.    Interactive audio and video telecommunications were attempted between this provider and patient, however failed, due to patient having technical difficulties OR patient did not have access to video capability.  We continued and completed visit with audio only.     Vital signs may be patient reported or missing.  Subjective:   Cynthia Hurst is a 86 y.o. female who presents for Medicare Annual (Subsequent) preventive examination.  Review of Systems     Cardiac Risk Factors include: advanced age (>32mn, >>89women);hypertension;sedentary lifestyle     Objective:    Today's Vitals   08/02/21 1111  Weight: 154 lb (69.9 kg)  Height: '5\' 5"'$  (1.651 m)   Body mass index is 25.63 kg/m.  Advanced Directives 08/02/2021 08/20/2020 07/27/2020 02/07/2020 08/08/2019 07/03/2019 02/07/2019  Does Patient Have a Medical Advance Directive? Yes No No No No No No  Type of [place] - - - - - -  Copy of [place] Chart? No - copy requested - - - - - -  Would patient like information on creating a medical advance directive? - No - Patient declined - - - No - Patient declined No - Patient declined    Current Medications (verified) Outpatient Encounter Medications as of 08/02/2021  Medication Sig   allopurinol (ZYLOPRIM)  100 MG tablet TAKE 1 TABLET DAILY   atenolol (TENORMIN) 100 MG tablet TAKE 1 TABLET DAILY   cholecalciferol (VITAMIN D) 1000 UNITS tablet Take 1,000 Units by mouth daily.   cyanocobalamin 2000 MCG tablet Take 2,000 mcg by mouth daily.   KLOR-CON M20 20 MEQ tablet TAKE 1 TABLET DAILY   NIFEdipine (ADALAT CC) 60 MG 24 hr tablet TAKE 1 TABLET DAILY   omeprazole (PRILOSEC) 20 MG capsule TAKE 1 CAPSULE DAILY   raloxifene (EVISTA) 60 MG tablet TAKE 1 TABLET DAILY   Diclofenac Sodium 3 % GEL Apply 1 application topically 2 (two) times daily as needed (for back pain). (Patient not taking: No sig reported)   No facility-administered encounter medications on file as of 08/02/2021.    Allergies (verified) Patient has no known allergies.   History: Past Medical History:  Diagnosis Date   Chronic kidney disease    GERD (gastroesophageal reflux disease)    Gout    Hypertension    Hypopotassemia    Osteoarthritis    Osteoporosis    LUMBAR SPINE   Vitamin B 12 deficiency    Vitamin D deficiency    Past Surgical History:  Procedure Laterality Date   ABDOMINAL HYSTERECTOMY     PARTIAL   EYE SURGERY Left 05/2018   cataract surgery    JOINT REPLACEMENT Bilateral 2010,2011   KNEE   Family History  Problem Relation Age of Onset   Heart disease Mother    Cancer Father    Alzheimer's disease Father    Breast cancer Neg Hx    Social History  Socioeconomic History   Marital status: Married    Spouse name: Not on file   Number of children: Not on file   Years of education: Not on file   Highest education level: High school graduate  Occupational History   Not on file  Tobacco Use   Smoking status: Never   Smokeless tobacco: Never  Vaping Use   Vaping Use: Never used  Substance and Sexual Activity   Alcohol use: No   Drug use: No   Sexual activity: Yes  Other Topics Concern   Not on file  Social History Narrative   Not on file   Social Determinants of Health   Financial  Resource Strain: Low Risk    Difficulty of Paying Living Expenses: Not hard at all  Food Insecurity: No Food Insecurity   Worried About Charity fundraiser in the Last Year: Never true   [place] in the Last Year: Never true  Transportation Needs: No Transportation Needs   Lack of Transportation (Medical): No   Lack of Transportation (Non-Medical): No  Physical Activity: Inactive   Days of Exercise per Week: 0 days   Minutes of Exercise per Session: 0 min  Stress: No Stress Concern Present   Feeling of Stress : Not at all  Social Connections: Not on file    Tobacco Counseling Counseling given: Not Answered   Clinical Intake:  Pre-visit preparation completed: Yes  Pain : No/denies pain     Nutritional Status: BMI 25 -29 Overweight Nutritional Risks: None Diabetes: No  How often do you need to have someone help you when you read instructions, pamphlets, or other written materials from your doctor or pharmacy?: 1 - Never What is the last grade level you completed in school?: 12th grade  Diabetic? no  Interpreter Needed?: No  Information entered by :: NAllen LPN   Activities of Daily Living In your present state of health, do you have any difficulty performing the following activities: 08/02/2021  Hearing? N  Vision? N  Difficulty concentrating or making decisions? N  Walking or climbing stairs? N  Dressing or bathing? N  Doing errands, shopping? N  Preparing Food and eating ? N  Using the Toilet? N  In the past six months, have you accidently leaked urine? N  Do you have problems with loss of bowel control? N  Managing your Medications? N  Managing your Finances? N  Housekeeping or managing your Housekeeping? N  Some recent data might be hidden    Patient Care Team: Venita Lick, NP as PCP - General (Nurse Practitioner) Marlowe Sax, MD as Referring Physician (Internal Medicine) Earlie Server, MD as Consulting Physician  (Oncology)  Indicate any recent Medical Services you may have received from other than [facility] providers in the past year (date may be approximate).     Assessment:   This is a routine wellness examination for [name].  Hearing/Vision screen No results found.  Dietary issues and exercise activities discussed: Current Exercise Habits: The patient does not participate in regular exercise at present   Goals Addressed             This Visit's Progress    Patient Stated       08/02/2021, drink more water and exercise more       Depression Screen PHQ 2/9 Scores 08/02/2021 07/27/2020 07/03/2019 06/27/2018 06/21/2017 06/10/2016 06/10/2015  PHQ - 2 Score 0 0 0 0 0 0 0  PHQ- 9 Score - 0 - - - - -  Fall Risk Fall Risk  08/02/2021 07/27/2020 11/05/2019 07/03/2019 06/27/2018  Falls in the past year? 0 0 0 0 No  Comment - - Emmi Telephone Survey: data to providers prior to load - -  Number falls in past yr: - - - - -  Injury with Fall? - - - - -  Risk for fall due to : Medication side effect Medication side effect - - -  Follow up Falls evaluation completed;Education provided;Falls prevention discussed Falls evaluation completed;Education provided;Falls prevention discussed - - -    FALL RISK PREVENTION PERTAINING TO THE HOME:  Any stairs in or around the home? No  If so, are there any without handrails?  N/a Home free of loose throw rugs in walkways, pet beds, electrical cords, etc? Yes  Adequate lighting in your home to reduce risk of falls? Yes   ASSISTIVE DEVICES UTILIZED TO PREVENT FALLS:  Life alert? No  Use of a cane, walker or w/c? No  Grab bars in the bathroom? No  Shower chair or bench in shower? Yes  Elevated toilet seat or a handicapped toilet? Yes   TIMED UP AND GO:  Was the test performed? No .       Cognitive Function:     6CIT Screen 08/02/2021 07/27/2020 07/03/2019 06/27/2018 06/21/2017  What Year? 0 points 0 points 0 points 0 points 0 points  What month? 0  points 0 points 0 points 0 points 0 points  What time? 0 points 0 points 0 points 0 points 0 points  Count back from 20 0 points 0 points 0 points 0 points 0 points  Months in reverse 2 points 4 points 0 points 0 points 0 points  Repeat phrase 2 points 0 points 0 points 2 points 0 points  Total Score 4 4 0 2 0    Immunizations Immunization History  Administered Date(s) Administered   Fluad Quad(high Dose 65+) 10/01/2020   Moderna Sars-Covid-2 Vaccination 01/24/2020, 02/21/2020   Pneumococcal Polysaccharide-23 03/23/2005   Td 03/23/2005    TDAP status: Due, Education has been provided regarding the importance of this vaccine. Advised may receive this vaccine at local pharmacy or Health Dept. Aware to provide a copy of the vaccination record if obtained from local pharmacy or Health Dept. Verbalized acceptance and understanding.  Flu Vaccine status: Due, Education has been provided regarding the importance of this vaccine. Advised may receive this vaccine at local pharmacy or Health Dept. Aware to provide a copy of the vaccination record if obtained from local pharmacy or Health Dept. Verbalized acceptance and understanding.  Pneumococcal vaccine status: Declined,  Education has been provided regarding the importance of this vaccine but patient still declined. Advised may receive this vaccine at local pharmacy or Health Dept. Aware to provide a copy of the vaccination record if obtained from local pharmacy or Health Dept. Verbalized acceptance and understanding.   Covid-19 vaccine status: Completed vaccines  Qualifies for Shingles Vaccine? Yes   Zostavax completed No   Shingrix Completed?: No.    Education has been provided regarding the importance of this vaccine. Patient has been advised to call insurance company to determine out of pocket expense if they have not yet received this vaccine. Advised may also receive vaccine at local pharmacy or Health Dept. Verbalized acceptance and  understanding.  Screening Tests Health Maintenance  Topic Date Due   Zoster Vaccines- Shingrix (1 of 2) Never done   COVID-19 Vaccine (3 - Moderna risk series) 03/20/2020   INFLUENZA VACCINE  07/12/2021   TETANUS/TDAP  10/01/2021 (Originally 03/24/2015)   PNA vac Low Risk Adult (2 of 2 - PCV13) 10/01/2021 (Originally 03/23/2006)   DEXA SCAN  Completed   HPV VACCINES  Aged Out    Health Maintenance  Health Maintenance Due  Topic Date Due   Zoster Vaccines- Shingrix (1 of 2) Never done   COVID-19 Vaccine (3 - Moderna risk series) 03/20/2020   INFLUENZA VACCINE  07/12/2021    Colorectal cancer screening: No longer required.   Mammogram status: No longer required due to age.  Bone Density status: Completed 05/25/2010.   Lung Cancer Screening: (Low Dose CT Chest recommended if Age 84-80 years, 30 pack-year currently smoking OR have quit w/in 15years.) does not qualify.   Lung Cancer Screening Referral: no  Additional Screening:  Hepatitis C Screening: does not qualify;   Vision Screening: Recommended annual ophthalmology exams for early detection of glaucoma and other disorders of the eye. Is the patient up to date with their annual eye exam?  No  Who is the provider or what is the name of the office in which the patient attends annual eye exams? Can't remember name If pt is not established with a provider, would they like to be referred to a provider to establish care? No .   Dental Screening: Recommended annual dental exams for proper oral hygiene  Community Resource Referral / Chronic Care Management: CRR required this visit?  No   CCM required this visit?  No      Plan:     I have personally reviewed and noted the following in the patient's chart:   Medical and social history Use of alcohol, tobacco or illicit drugs  Current medications and supplements including opioid prescriptions.  Functional ability and status Nutritional status Physical activity Advanced  directives List of other physicians Hospitalizations, surgeries, and ER visits in previous 12 months Vitals Screenings to include cognitive, depression, and falls Referrals and appointments  In addition, I have reviewed and discussed with patient certain preventive protocols, quality metrics, and best practice recommendations. A written personalized care plan for preventive services as well as general preventive health recommendations were provided to patient.     Kellie Simmering, LPN   [id number]   Nurse Notes:

## 2021-10-04 ENCOUNTER — Emergency Department (HOSPITAL_COMMUNITY): Admission: EM | Admit: 2021-10-04 | Payer: Medicare Other | Source: Home / Self Care

## 2021-10-18 ENCOUNTER — Telehealth: Payer: Self-pay | Admitting: Nurse Practitioner

## 2021-10-18 ENCOUNTER — Other Ambulatory Visit: Payer: Self-pay | Admitting: Nurse Practitioner

## 2021-10-18 ENCOUNTER — Other Ambulatory Visit: Payer: Self-pay

## 2021-10-18 ENCOUNTER — Ambulatory Visit (INDEPENDENT_AMBULATORY_CARE_PROVIDER_SITE_OTHER): Payer: Medicare Other | Admitting: Nurse Practitioner

## 2021-10-18 ENCOUNTER — Encounter: Payer: Self-pay | Admitting: Nurse Practitioner

## 2021-10-18 VITALS — BP 108/62 | HR 62 | Temp 99.0°F | Wt 150.2 lb

## 2021-10-18 DIAGNOSIS — M1A3720 Chronic gout due to renal impairment, left ankle and foot, without tophus (tophi): Secondary | ICD-10-CM | POA: Diagnosis not present

## 2021-10-18 DIAGNOSIS — N1831 Chronic kidney disease, stage 3a: Secondary | ICD-10-CM | POA: Diagnosis not present

## 2021-10-18 DIAGNOSIS — K219 Gastro-esophageal reflux disease without esophagitis: Secondary | ICD-10-CM | POA: Diagnosis not present

## 2021-10-18 DIAGNOSIS — I700 Atherosclerosis of aorta: Secondary | ICD-10-CM

## 2021-10-18 DIAGNOSIS — E559 Vitamin D deficiency, unspecified: Secondary | ICD-10-CM | POA: Diagnosis not present

## 2021-10-18 DIAGNOSIS — I129 Hypertensive chronic kidney disease with stage 1 through stage 4 chronic kidney disease, or unspecified chronic kidney disease: Secondary | ICD-10-CM

## 2021-10-18 DIAGNOSIS — Z23 Encounter for immunization: Secondary | ICD-10-CM

## 2021-10-18 DIAGNOSIS — M8589 Other specified disorders of bone density and structure, multiple sites: Secondary | ICD-10-CM | POA: Diagnosis not present

## 2021-10-18 DIAGNOSIS — D472 Monoclonal gammopathy: Secondary | ICD-10-CM | POA: Diagnosis not present

## 2021-10-18 LAB — MICROALBUMIN, URINE WAIVED
Creatinine, Urine Waived: 50 mg/dL (ref 10–300)
Microalb, Ur Waived: 10 mg/L (ref 0–19)
Microalb/Creat Ratio: <30 mg/g (ref <30)

## 2021-10-18 MED ORDER — ATENOLOL 100 MG PO TABS: 100.0000 mg | ORAL_TABLET | Freq: Every day | ORAL | 4 refills | Status: DC

## 2021-10-18 MED ORDER — ALLOPURINOL 100 MG PO TABS: 100.0000 mg | ORAL_TABLET | Freq: Every day | ORAL | 4 refills | Status: DC

## 2021-10-18 MED ORDER — OMEPRAZOLE 20 MG PO CPDR
20.0000 mg | DELAYED_RELEASE_CAPSULE | Freq: Every day | ORAL | 4 refills | Status: DC
Start: 2021-10-18 — End: 2022-09-27

## 2021-10-18 MED ORDER — RALOXIFENE HCL 60 MG PO TABS: 60.0000 mg | ORAL_TABLET | Freq: Every day | ORAL | 4 refills | Status: DC

## 2021-10-18 MED ORDER — NIFEDIPINE ER 60 MG PO TB24: 60.0000 mg | ORAL_TABLET | Freq: Every day | ORAL | 4 refills | Status: DC

## 2021-10-18 NOTE — Assessment & Plan Note (Signed)
Chronic, stable.  Continue daily supplement and adjust as needed.  Labs today. 

## 2021-10-18 NOTE — Assessment & Plan Note (Signed)
Chronic, ongoing.  BP below goal in office today for age.  Continue current medication regimen and adjust as needed -- have recommend she have daughter write her BP numbers down at home and if remains lower next visit will reduce Nifedipine to 30 MG daily.  CMP, CBC, and TSH today.  Renal dose medications as needed.  Recommend she continue to monitor BP at home at least a few days a week and document + focus on DASH diet.  Return in 6 months.

## 2021-10-18 NOTE — Patient Instructions (Addendum)
[facility]  Address: [address], [place], [place] [address]  Phone: [phone]   Bone Density Test A bone density test uses a type of X-ray to measure the amount of calcium and other minerals in a person's bones. It can measure bone density in the hip and the spine. The test is similar to having a regular X-ray. This test may also be called: Bone densitometry. Bone mineral density test. Dual-energy X-ray absorptiometry (DEXA). You may have this test to: Diagnose a condition that causes weak or thin bones (osteoporosis). Screen you for osteoporosis. Predict your risk for a broken bone (fracture). Determine how well your osteoporosis treatment is working. Tell a health care provider about: Any allergies you have. All medicines you are taking, including vitamins, herbs, eye drops, creams, and over-the-counter medicines. Any problems you or family members have had with anesthetic medicines. Any blood disorders you have. Any surgeries you have had. Any medical conditions you have. Whether you are pregnant or may be pregnant. Any medical tests you have had within the past 14 days that used contrast material. What are the risks? Generally, this is a safe test. However, it does expose you to a small amount of radiation, which can slightly increase your cancer risk. What happens before the test? Do not take any calcium supplements within the 24 hours before your test. You will need to remove all metal jewelry, eyeglasses, removable dental appliances, and any other metal objects on your body. What happens during the test?  You will lie down on an exam table. There will be an X-ray generator below you and an imaging device above you. Other devices, such as boxes or braces, may be used to position your body properly for the scan. The machine will slowly scan your body. You will need to keep very still while the machine does the scan. The images will  show up on a screen in the room. Images will be examined by a specialist after your test is finished. The procedure may vary among health care providers and hospitals. What can I expect after the test? It is up to you to get the results of your test. Ask your health care provider, or the department that is doing the test, when your results will be ready. Summary A bone density test is an imaging test that uses a type of X-ray to measure the amount of calcium and other minerals in your bones. The test may be used to diagnose or screen you for a condition that causes weak or thin bones (osteoporosis), predict your risk for a broken bone (fracture), or determine how well your osteoporosis treatment is working. Do not take any calcium supplements within 24 hours before your test. Ask your health care provider, or the department that is doing the test, when your results will be ready. This information is not intended to replace advice given to you by your health care provider. Make sure you discuss any questions you have with your health care provider. Document Revised: 08/11/2021 Document Reviewed: 05/14/2020 Elsevier Patient Education  [place].

## 2021-10-18 NOTE — Progress Notes (Signed)
BP 108/62 (BP Location: Left Arm)   Pulse 62 Comment: apical  Temp 99 F (37.2 C) (Oral)   Wt 150 lb 3.2 oz (68.1 kg)   LMP  (LMP Unknown)   SpO2 97%   BMI 24.99 kg/m    Subjective:    Patient ID: Cynthia Hurst, female    DOB: 23-Mar-1935, 86 y.o.   MRN: [id number]  HPI: Cynthia Hurst is a 86 y.o. female  Chief Complaint  Patient presents with   Gastroesophageal Reflux   Hypertension   Osteopenia   Chronic Kidney Disease   OSTEOPENIA Last DEXA several years ago and not on chart, done in office per reported.  Osteopenia on dx list.  Has been on Evista for years. Past osteoporosis medications/treatments:  Adequate calcium & vitamin D: yes Weight bearing exercises: yes   HYPERTENSION Continues on Nifedipine and Atenolol.  Aortic atherosclerosis noted on imaging 03/22/2018.   Satisfied with current treatment? yes Duration of hypertension: chronic BP monitoring frequency: monthly BP range: unsure BP medication side effects: no Aspirin: no Recent stressors: no Recurrent headaches: no Visual changes: no Palpitations: no Dyspnea: no Chest pain: no Lower extremity edema: no Dizzy/lightheaded: no   CHRONIC KIDNEY DISEASE Continues on Allopurinol for gout history, no recent flares. CKD status: controlled Medications renally dose: yes Previous renal evaluation: no Pneumovax:  refused Influenza Vaccine:  Refused  GERD Continues on Omeprazole as needed.  Very seldom uses. GERD control status: stable  Satisfied with current treatment? yes Heartburn frequency: occasional Medication side effects: no  Medication compliance: stable Dysphagia: no Odynophagia:  no Hematemesis: no Blood in stool: no EGD: no  MGUS: Saw Dr. Tasia Catchings on 03/04/21.  Had renal ultrasound on 02/14/2020 with normal findings, right-sided renal cysts measuring 1.8 cm and questionable mild bladder wall thickening. She is to return to oncology in 6 months for follow-up.  Denies any B symptoms at this  time.  Relevant past medical, surgical, family and social history reviewed and updated as indicated. Interim medical history since our last visit reviewed. Allergies and medications reviewed and updated.  Review of Systems  Constitutional:  Negative for activity change, appetite change, diaphoresis, fatigue and fever.  Respiratory:  Negative for cough, chest tightness and shortness of breath.   Cardiovascular:  Negative for chest pain, palpitations and leg swelling.  Gastrointestinal: Negative.   Musculoskeletal:  Negative for back pain.  Neurological: Negative.   Psychiatric/Behavioral: Negative.     Per HPI unless specifically indicated above     Objective:    BP 108/62 (BP Location: Left Arm)   Pulse 62 Comment: apical  Temp 99 F (37.2 C) (Oral)   Wt 150 lb 3.2 oz (68.1 kg)   LMP  (LMP Unknown)   SpO2 97%   BMI 24.99 kg/m   Wt Readings from Last 3 Encounters:  10/18/21 150 lb 3.2 oz (68.1 kg)  08/02/21 154 lb (69.9 kg)  04/14/21 152 lb 6.4 oz (69.1 kg)    Physical Exam Vitals and nursing note reviewed.  Constitutional:      General: She is awake. She is not in acute distress.    Appearance: She is well-developed and well-groomed. She is not ill-appearing.  HENT:     Head: Normocephalic.     Right Ear: Hearing normal.     Left Ear: Hearing normal.  Eyes:     General: Lids are normal.        Right eye: No discharge.  Left eye: No discharge.     Conjunctiva/sclera: Conjunctivae normal.     Pupils: Pupils are equal, round, and reactive to light.  Neck:     Thyroid: No thyromegaly.     Vascular: No carotid bruit.  Cardiovascular:     Rate and Rhythm: Normal rate and regular rhythm.     Heart sounds: Normal heart sounds. No murmur heard.   No gallop.  Pulmonary:     Effort: Pulmonary effort is normal. No accessory muscle usage or respiratory distress.     Breath sounds: Normal breath sounds.  Abdominal:     General: Bowel sounds are normal.      Palpations: Abdomen is soft.     Tenderness: There is no abdominal tenderness.  Musculoskeletal:     Cervical back: Normal range of motion and neck supple.     Right lower leg: No edema.     Left lower leg: No edema.     Comments: Kyphosis present.  Skin:    General: Skin is warm and dry.  Neurological:     Mental Status: She is alert and oriented to person, place, and time.  Psychiatric:        Attention and Perception: Attention normal.        Mood and Affect: Mood normal.        Speech: Speech normal.        Behavior: Behavior normal. Behavior is cooperative.        Thought Content: Thought content normal.    Results for orders placed or performed in visit on 04/14/21  HgB A1c  Result Value Ref Range   Hgb A1c MFr Bld 5.5 4.8 - 5.6 %   Est. average glucose Bld gHb Est-mCnc 111 mg/dL  Basic metabolic panel  Result Value Ref Range   Glucose 89 65 - 99 mg/dL   BUN 17 8 - 27 mg/dL   Creatinine, Ser 1.07 (H) 0.57 - 1.00 mg/dL   eGFR 51 (L) >59 mL/min/1.73   BUN/Creatinine Ratio 16 12 - 28   Sodium 142 134 - 144 mmol/L   Potassium 4.5 3.5 - 5.2 mmol/L   Chloride 103 96 - 106 mmol/L   CO2 22 20 - 29 mmol/L   Calcium 10.2 8.7 - 10.3 mg/dL  VITAMIN D 25 Hydroxy (Vit-D Deficiency, Fractures)  Result Value Ref Range   Vit D, 25-Hydroxy 28.0 (L) 30.0 - 100.0 ng/mL  Vitamin B12  Result Value Ref Range   Vitamin B-12 >2000 (H) 232 - 1245 pg/mL  TSH  Result Value Ref Range   TSH 3.170 0.450 - 4.500 uIU/mL      Assessment & Plan:   Problem List Items Addressed This Visit       Cardiovascular and Mediastinum   Aortic atherosclerosis ([place])    Noted on imaging on 03/22/2018.  At this time will not initiate statin due to advanced and patient wishes.  Recommend daily Baby ASA 81 MG for prevention.      Relevant Medications   atenolol (TENORMIN) 100 MG tablet   NIFEdipine (ADALAT CC) 60 MG 24 hr tablet   Benign hypertension with chronic kidney disease    Chronic, ongoing.  BP  below goal in office today for age.  Continue current medication regimen and adjust as needed -- have recommend she have daughter write her BP numbers down at home and if remains lower next visit will reduce Nifedipine to 30 MG daily.  CMP, CBC, and TSH today.  Renal dose medications  as needed.  Recommend she continue to monitor BP at home at least a few days a week and document + focus on DASH diet.  Return in 6 months.      Relevant Medications   atenolol (TENORMIN) 100 MG tablet   NIFEdipine (ADALAT CC) 60 MG 24 hr tablet   Other Relevant Orders   CBC with Differential/Platelet   Comprehensive metabolic panel   Lipid Panel w/o Chol/HDL Ratio   Microalbumin, Urine Waived     Digestive   GERD (gastroesophageal reflux disease)    Chronic, stable, minimally used Prilosec.  Continue current regimen on as needed basis and diet focus. Mag level today.      Relevant Medications   omeprazole (PRILOSEC) 20 MG capsule   Other Relevant Orders   Magnesium     Musculoskeletal and Integument   Osteopenia    Present to lumbar and thoracic spine.  Continue daily Vit D3 and Calcium supplements + Evista.  Recommend continued daily PT exercises for strengthening at home.  Check Vit D. DEXA ordered and discussed with patient.      Relevant Orders   VITAMIN D 25 Hydroxy (Vit-D Deficiency, Fractures)   DG Bone Density     Genitourinary   CKD (chronic kidney disease), stage III ([place]) - Primary    Chronic with CKD 3a.  Monitor CMP today and refer to nephrology as needed if worsening CKD.  Renal dose medications as needed.        Relevant Orders   CBC with Differential/Platelet   Comprehensive metabolic panel     Other   Gout    Chronic, stable with Allopurinol at 100 MG.  Continue current regimen and adjust as needed.  Uric acid level and CMP today.      Relevant Orders   Uric acid   MGUS (monoclonal gammopathy of unknown significance)    Chronic, ongoing.  Continue collaboration with  oncology, recent note reviewed.      Vitamin D deficiency    Chronic, stable.  Continue daily supplement and adjust as needed.  Labs today.      Relevant Orders   VITAMIN D 25 Hydroxy (Vit-D Deficiency, Fractures)   Other Visit Diagnoses     Pneumococcal vaccination given       PCV13 provided today in office, has only had PPSV23 on review.  Discussed with patient.   Relevant Orders   Pneumococcal conjugate vaccine 13-valent (Completed)   Flu vaccine need       High dose flu vaccine today.   Relevant Orders   Flu Vaccine QUAD High Dose(Fluad) (Completed)        Follow up plan: Return in about 6 months (around 04/17/2022) for HTN, GOUT, OSTEOPENIA, CKD.

## 2021-10-18 NOTE — Assessment & Plan Note (Signed)
Noted on imaging on 03/22/2018.  At this time will not initiate statin due to advanced and patient wishes.  Recommend daily Baby ASA 81 MG for prevention.

## 2021-10-18 NOTE — Assessment & Plan Note (Signed)
Chronic, ongoing.  Continue collaboration with oncology, recent note reviewed. 

## 2021-10-18 NOTE — Assessment & Plan Note (Signed)
Chronic with CKD 3a.  Monitor CMP today and refer to nephrology as needed if worsening CKD.  Renal dose medications as needed.

## 2021-10-18 NOTE — Telephone Encounter (Signed)
Copied from [place] [phone]. Topic: General - Other >> Oct 18, 2021  1:40 PM Celene Kras wrote: Reason for CRM: Pts granddaughter calling on behalf of pt. She states that the pt had an appt today, and that the pt is very unsure as to what happened during the appt. Requesting to have a call back to discuss her appt today. She states that the best person to contact is [place] pts daughter. Please advise.

## 2021-10-18 NOTE — Assessment & Plan Note (Signed)
Chronic, stable with Allopurinol at 100 MG.  Continue current regimen and adjust as needed.  Uric acid level and CMP today.

## 2021-10-18 NOTE — Telephone Encounter (Signed)
Requested Prescriptions  Pending Prescriptions Disp Refills  . NIFEdipine (ADALAT CC) 60 MG 24 hr tablet [Pharmacy Med Name: NIFEDIPINE ER (CC) TABS 60MG ] 90 tablet 1    Sig: TAKE 1 TABLET DAILY     Cardiovascular:  Calcium Channel Blockers Passed - 10/18/2021 12:14 AM      Passed - Last BP in normal range    BP Readings from Last 1 Encounters:  04/14/21 112/63         Passed - Valid encounter within last 6 months    Recent Outpatient Visits          6 months ago Aortic atherosclerosis ([place])   [place], Henrine Screws T, NP   1 year ago Stage 3a chronic kidney disease ([place])   [place], [name] T, NP   1 year ago Gout, unspecified cause, unspecified chronicity, unspecified site   [facility], [place], [place]   1 year ago Chronic left-sided low back pain without sciatica   [name], Barbaraann Faster, NP   1 year ago MGUS (monoclonal gammopathy of unknown significance)   [place], Barbaraann Faster, NP      Future Appointments            Today Venita Lick, NP [place], [facility]   In 9 months  [facility], [facility]

## 2021-10-18 NOTE — Assessment & Plan Note (Signed)
Chronic, stable, minimally used Prilosec.  Continue current regimen on as needed basis and diet focus. Mag level today.

## 2021-10-18 NOTE — Assessment & Plan Note (Signed)
Present to lumbar and thoracic spine.  Continue daily Vit D3 and Calcium supplements + Evista.  Recommend continued daily PT exercises for strengthening at home.  Check Vit D. DEXA ordered and discussed with patient.

## 2021-10-19 LAB — CBC WITH DIFFERENTIAL/PLATELET
Basophils Absolute: 0 10*3/uL (ref 0.0–0.2)
Basos: 0 %
EOS (ABSOLUTE): 0.1 10*3/uL (ref 0.0–0.4)
Eos: 2 %
Hematocrit: 37.6 % (ref 34.0–46.6)
Hemoglobin: 12.6 g/dL (ref 11.1–15.9)
Immature Grans (Abs): 0 10*3/uL (ref 0.0–0.1)
Immature Granulocytes: 0 %
Lymphocytes Absolute: 1.7 10*3/uL (ref 0.7–3.1)
Lymphs: 31 %
MCH: 31.2 pg (ref 26.6–33.0)
MCHC: 33.5 g/dL (ref 31.5–35.7)
MCV: 93 fL (ref 79–97)
Monocytes Absolute: 0.6 10*3/uL (ref 0.1–0.9)
Monocytes: 11 %
Neutrophils Absolute: 3 10*3/uL (ref 1.4–7.0)
Neutrophils: 56 %
Platelets: 152 10*3/uL (ref 150–450)
RBC: 4.04 x10E6/uL (ref 3.77–5.28)
RDW: 14.5 % (ref 11.7–15.4)
WBC: 5.4 10*3/uL (ref 3.4–10.8)

## 2021-10-19 LAB — COMPREHENSIVE METABOLIC PANEL
ALT: 9 IU/L (ref 0–32)
AST: 18 IU/L (ref 0–40)
Albumin/Globulin Ratio: 1.9 (ref 1.2–2.2)
Albumin: 4 g/dL (ref 3.6–4.6)
Alkaline Phosphatase: 99 IU/L (ref 44–121)
BUN/Creatinine Ratio: 13 (ref 12–28)
BUN: 13 mg/dL (ref 8–27)
Bilirubin Total: 0.7 mg/dL (ref 0.0–1.2)
CO2: 25 mmol/L (ref 20–29)
Calcium: 9.5 mg/dL (ref 8.7–10.3)
Chloride: 104 mmol/L (ref 96–106)
Creatinine, Ser: 1.04 mg/dL — ABNORMAL HIGH (ref 0.57–1.00)
Globulin, Total: 2.1 g/dL (ref 1.5–4.5)
Glucose: 119 mg/dL — ABNORMAL HIGH (ref 70–99)
Potassium: 3.9 mmol/L (ref 3.5–5.2)
Sodium: 139 mmol/L (ref 134–144)
Total Protein: 6.1 g/dL (ref 6.0–8.5)
eGFR: 53 mL/min/{1.73_m2} — ABNORMAL LOW (ref >59)

## 2021-10-19 LAB — MAGNESIUM: Magnesium: 1.7 mg/dL (ref 1.6–2.3)

## 2021-10-19 LAB — LIPID PANEL W/O CHOL/HDL RATIO
Cholesterol, Total: 178 mg/dL (ref 100–199)
HDL: 61 mg/dL (ref >39)
LDL Chol Calc (NIH): 94 mg/dL (ref 0–99)
Triglycerides: 129 mg/dL (ref 0–149)
VLDL Cholesterol Cal: 23 mg/dL (ref 5–40)

## 2021-10-19 LAB — URIC ACID: Uric Acid: 4.2 mg/dL (ref 3.1–7.9)

## 2021-10-19 LAB — VITAMIN D 25 HYDROXY (VIT D DEFICIENCY, FRACTURES): Vit D, 25-Hydroxy: 36.5 ng/mL (ref 30.0–100.0)

## 2021-10-19 NOTE — Telephone Encounter (Signed)
Spoke to patient and her daughter on phone, explained CKD 3, which has shown on her labs since 2016.  Discussed at length this with them.  Also discussed her current wait, very mild loss this check we will monitor, recommend protein shakes a couple times a day.  They stated appreciation for call.Marland Kitchen

## 2021-10-19 NOTE — Telephone Encounter (Signed)
Patient daughter would like to know what Stage 3 kidney disease means.

## 2021-10-19 NOTE — Progress Notes (Signed)
Good morning, please let Cynthia Hurst know her labs have returned and overall everything remains stable.  CBC shows no anemia or infection.  Kidney function continues to show mild kidney disease, but no decline which is good.  Cholesterol levels are stable for her age no medications needed.  Vitamin D level normal, continue supplement daily.  Magnesium and uric acid levels normal.  No medication changes needed at this time.  Please ensure you schedule your bone scan.  Any questions? Keep being amazing!!  Thank you for allowing me to participate in your care.  I appreciate you. Kindest regards, Ruven Corradi

## 2021-10-25 ENCOUNTER — Other Ambulatory Visit: Payer: Self-pay | Admitting: Nurse Practitioner

## 2021-10-25 NOTE — Telephone Encounter (Signed)
Requested Prescriptions  Pending Prescriptions Disp Refills  . KLOR-CON M20 20 MEQ tablet [Pharmacy Med Name: POTASSIUM CHLORIDE ER (DISP) TABS 20MEQ] 90 tablet 3    Sig: TAKE 1 TABLET DAILY     Endocrinology:  Minerals - Potassium Supplementation Failed - 10/25/2021  2:41 AM      Failed - Cr in normal range and within 360 days    Creatinine, Ser  Date Value Ref Range Status  10/18/2021 1.04 (H) 0.57 - 1.00 mg/dL Final         Passed - K in normal range and within 360 days    Potassium  Date Value Ref Range Status  10/18/2021 3.9 3.5 - 5.2 mmol/L Final         Passed - Valid encounter within last 12 months    Recent Outpatient Visits          1 week ago Stage 3a chronic kidney disease ([place])   [place], [name] T, NP   6 months ago Aortic atherosclerosis ([place])   [place], Henrine Screws T, NP   1 year ago Stage 3a chronic kidney disease ([place])   [place], [name] T, NP   1 year ago Gout, unspecified cause, unspecified chronicity, unspecified site   [facility], [place], [place]   1 year ago Chronic left-sided low back pain without sciatica   [place], Barbaraann Faster, NP      Future Appointments            In 5 months [name], Barbaraann Faster, NP [facility], [facility]   In 9 months  [facility], [facility]

## 2022-01-19 ENCOUNTER — Telehealth: Payer: Self-pay | Admitting: Nurse Practitioner

## 2022-01-19 NOTE — Telephone Encounter (Signed)
Pt;s daughter called reporting that the current Rx for raloxifene (EVISTA) 60 MG tablet   is too high please advise, the pharmacy says the Rx needs a Tier reduction.

## 2022-01-24 NOTE — Telephone Encounter (Signed)
Attempted PA, cover my meds states that it is not needed.   Tried [facility]. Was on hold and was told I would be for longer than 15 minutes. Will try to call again tomorrow.

## 2022-01-27 NOTE — Telephone Encounter (Signed)
Called [facility].Spoke with the PA department and completed a Tier Reduction. Medication wet down to a tier 1, case approval nunber [id number]. Good from 12/12/21 through 12/11/22.   Called and notified patient of this approval.

## 2022-02-25 ENCOUNTER — Inpatient Hospital Stay: Payer: Medicare Other | Attending: Oncology

## 2022-02-25 ENCOUNTER — Other Ambulatory Visit: Payer: Self-pay

## 2022-02-25 DIAGNOSIS — N183 Chronic kidney disease, stage 3 unspecified: Secondary | ICD-10-CM | POA: Insufficient documentation

## 2022-02-25 DIAGNOSIS — D472 Monoclonal gammopathy: Secondary | ICD-10-CM | POA: Diagnosis not present

## 2022-02-25 DIAGNOSIS — R5383 Other fatigue: Secondary | ICD-10-CM | POA: Insufficient documentation

## 2022-02-25 DIAGNOSIS — Z79899 Other long term (current) drug therapy: Secondary | ICD-10-CM | POA: Insufficient documentation

## 2022-02-25 LAB — COMPREHENSIVE METABOLIC PANEL
ALT: 9 U/L (ref 0–44)
AST: 21 U/L (ref 15–41)
Albumin: 3.8 g/dL (ref 3.5–5.0)
Alkaline Phosphatase: 85 U/L (ref 38–126)
Anion gap: 5 (ref 5–15)
BUN: 14 mg/dL (ref 8–23)
CO2: 28 mmol/L (ref 22–32)
Calcium: 9.5 mg/dL (ref 8.9–10.3)
Chloride: 105 mmol/L (ref 98–111)
Creatinine, Ser: 0.95 mg/dL (ref 0.44–1.00)
GFR, Estimated: 59 mL/min — ABNORMAL LOW (ref >60)
Glucose, Bld: 96 mg/dL (ref 70–99)
Potassium: 3.9 mmol/L (ref 3.5–5.1)
Sodium: 138 mmol/L (ref 135–145)
Total Bilirubin: 0.7 mg/dL (ref 0.3–1.2)
Total Protein: 7.2 g/dL (ref 6.5–8.1)

## 2022-02-25 LAB — CBC WITH DIFFERENTIAL/PLATELET
Abs Immature Granulocytes: 0.02 10*3/uL (ref 0.00–0.07)
Basophils Absolute: 0 10*3/uL (ref 0.0–0.1)
Basophils Relative: 0 %
Eosinophils Absolute: 0.1 10*3/uL (ref 0.0–0.5)
Eosinophils Relative: 1 %
HCT: 38.1 % (ref 36.0–46.0)
Hemoglobin: 12.8 g/dL (ref 12.0–15.0)
Immature Granulocytes: 0 %
Lymphocytes Relative: 26 %
Lymphs Abs: 1.6 10*3/uL (ref 0.7–4.0)
MCH: 31.1 pg (ref 26.0–34.0)
MCHC: 33.6 g/dL (ref 30.0–36.0)
MCV: 92.5 fL (ref 80.0–100.0)
Monocytes Absolute: 0.6 10*3/uL (ref 0.1–1.0)
Monocytes Relative: 9 %
Neutro Abs: 3.9 10*3/uL (ref 1.7–7.7)
Neutrophils Relative %: 64 %
Platelets: 152 10*3/uL (ref 150–400)
RBC: 4.12 MIL/uL (ref 3.87–5.11)
RDW: 14.2 % (ref 11.5–15.5)
WBC: 6.2 10*3/uL (ref 4.0–10.5)
nRBC: 0 % (ref 0.0–0.2)

## 2022-02-28 LAB — KAPPA/LAMBDA LIGHT CHAINS
Kappa free light chain: 35.6 mg/L — ABNORMAL HIGH (ref 3.3–19.4)
Kappa, lambda light chain ratio: 1.61 (ref 0.26–1.65)
Lambda free light chains: 22.1 mg/L (ref 5.7–26.3)

## 2022-03-01 LAB — MULTIPLE MYELOMA PANEL, SERUM
Albumin SerPl Elph-Mcnc: 3.4 g/dL (ref 2.9–4.4)
Albumin/Glob SerPl: 1.2 (ref 0.7–1.7)
Alpha 1: 0.3 g/dL (ref 0.0–0.4)
Alpha2 Glob SerPl Elph-Mcnc: 0.6 g/dL (ref 0.4–1.0)
B-Globulin SerPl Elph-Mcnc: 0.9 g/dL (ref 0.7–1.3)
Gamma Glob SerPl Elph-Mcnc: 1.2 g/dL (ref 0.4–1.8)
Globulin, Total: 3 g/dL (ref 2.2–3.9)
IgA: 197 mg/dL (ref 64–422)
IgG (Immunoglobin G), Serum: 1144 mg/dL (ref 586–1602)
IgM (Immunoglobulin M), Srm: 238 mg/dL — ABNORMAL HIGH (ref 26–217)
Total Protein ELP: 6.4 g/dL (ref 6.0–8.5)

## 2022-03-04 ENCOUNTER — Encounter: Payer: Self-pay | Admitting: Oncology

## 2022-03-04 ENCOUNTER — Other Ambulatory Visit: Payer: Self-pay

## 2022-03-04 ENCOUNTER — Inpatient Hospital Stay (HOSPITAL_BASED_OUTPATIENT_CLINIC_OR_DEPARTMENT_OTHER): Payer: Medicare Other | Admitting: Oncology

## 2022-03-04 VITALS — BP 139/81 | HR 58 | Temp 97.1°F | Wt 156.7 lb

## 2022-03-04 DIAGNOSIS — R5383 Other fatigue: Secondary | ICD-10-CM | POA: Diagnosis not present

## 2022-03-04 DIAGNOSIS — D472 Monoclonal gammopathy: Secondary | ICD-10-CM

## 2022-03-04 DIAGNOSIS — N183 Chronic kidney disease, stage 3 unspecified: Secondary | ICD-10-CM

## 2022-03-04 DIAGNOSIS — Z79899 Other long term (current) drug therapy: Secondary | ICD-10-CM | POA: Diagnosis not present

## 2022-03-04 NOTE — Progress Notes (Signed)
Patient here for follow up. Patient states she has some fatigue at times.  ?

## 2022-03-04 NOTE — Progress Notes (Signed)
?Hematology/Oncology Progress note ?[phone] [phone] ?  ? ? ? ?Patient Care Team: ?Venita Lick, NP as PCP - General (Nurse Practitioner) ?Marlowe Sax, MD as Referring Physician (Internal Medicine) ?Earlie Server, MD as Consulting Physician (Oncology) ? ? ?REASON FOR VISIT ?Follow up for management of MGUS ? ?HISTORY OF PRESENTING ILLNESS:  ?Cynthia Hurst is a  87 y.o.  female with PMH listed below who was referred to me for evaluation of hypercalcemia.  ?Patient was referred to see a see rheumatology for evaluation of positive ANA, joint pain.  Lab workup reviewed hypercalcemia of 11, normal PTH, normal 25-hydroxy vitamin D level, normal TSH, SPEP showed  monoclonal spike of 0.4.  Immunofixation showed elevated IgA monoclonal protein with kappa light chain specificity. ?Patient reports feeling well, she denies any fatigue, weight loss, back pain history of fracture.  She has bilateral knee replacement reports having posterior thigh pain bilaterally. ?Patient denies taking any calcium supplements.  Never smoker ? ?# Bone marrow biopsy results was reviewed with patient and her family members in details.  Showed 2% plasma cells, consistent with MGUS ? ?.INTERVAL HISTORY ?Cynthia Hurst is a 87 y.o. female who has above history reviewed by me today presents for follow-up visit for management of IgA MGUS. ?Was accompanied by daughter. ?+ Fatigue symptoms times ? ?Review of Systems  ?Constitutional:  Negative for chills, fever, malaise/fatigue and weight loss.  ?HENT:  Negative for sore throat.   ?Eyes:  Negative for redness.  ?Respiratory:  Negative for cough, shortness of breath and wheezing.   ?Cardiovascular:  Negative for chest pain, palpitations and leg swelling.  ?Gastrointestinal:  Negative for abdominal pain, blood in stool, nausea and vomiting.  ?Genitourinary:  Negative for dysuria.  ?Musculoskeletal:  Negative for myalgias.  ?Skin:  Negative for rash.  ?Neurological:   Negative for dizziness, tingling and tremors.  ?Endo/Heme/Allergies:  Does not bruise/bleed easily.  ?Psychiatric/Behavioral:  Negative for hallucinations.   ? ?MEDICAL HISTORY:  ?Past Medical History:  ?Diagnosis Date  ? Chronic kidney disease   ? GERD (gastroesophageal reflux disease)   ? Gout   ? Hypertension   ? Hypopotassemia   ? Osteoarthritis   ? Osteoporosis   ? LUMBAR SPINE  ? Vitamin B 12 deficiency   ? Vitamin D deficiency   ? ? ?SURGICAL HISTORY: ?Past Surgical History:  ?Procedure Laterality Date  ? ABDOMINAL HYSTERECTOMY    ? PARTIAL  ? EYE SURGERY Left 05/2018  ? cataract surgery   ? JOINT REPLACEMENT Bilateral 2010,2011  ? KNEE  ? ? ?SOCIAL HISTORY: ?Social History  ? ?Socioeconomic History  ? Marital status: Married  ?  Spouse name: Not on file  ? Number of children: Not on file  ? Years of education: Not on file  ? Highest education level: High school graduate  ?Occupational History  ? Not on file  ?Tobacco Use  ? Smoking status: Never  ? Smokeless tobacco: Never  ?Vaping Use  ? Vaping Use: Never used  ?Substance and Sexual Activity  ? Alcohol use: No  ? Drug use: No  ? Sexual activity: Yes  ?Other Topics Concern  ? Not on file  ?Social History Narrative  ? Not on file  ? ?Social Determinants of Health  ? ?Financial Resource Strain: Low Risk   ? Difficulty of Paying Living Expenses: Not hard at all  ?Food Insecurity: No Food Insecurity  ? Worried About Charity fundraiser in the Last Year: Never true  ? Ran  Out of Food in the Last Year: Never true  ?Transportation Needs: No Transportation Needs  ? Lack of Transportation (Medical): No  ? Lack of Transportation (Non-Medical): No  ?Physical Activity: Inactive  ? Days of Exercise per Week: 0 days  ? Minutes of Exercise per Session: 0 min  ?Stress: No Stress Concern Present  ? Feeling of Stress : Not at all  ?Social Connections: Not on file  ?Intimate Partner Violence: Not on file  ? ? ?FAMILY HISTORY: ?Family History  ?Problem Relation Age of Onset  ?  Heart disease Mother   ? Cancer Father   ? Alzheimer's disease Father   ? Breast cancer Neg Hx   ? ? ?ALLERGIES:  has No Known Allergies. ? ?MEDICATIONS:  ?Current Outpatient Medications  ?Medication Sig Dispense Refill  ? allopurinol (ZYLOPRIM) 100 MG tablet Take 1 tablet (100 mg total) by mouth daily. 90 tablet 4  ? atenolol (TENORMIN) 100 MG tablet Take 1 tablet (100 mg total) by mouth daily. 90 tablet 4  ? cholecalciferol (VITAMIN D) 1000 UNITS tablet Take 1,000 Units by mouth daily.    ? cyanocobalamin 2000 MCG tablet Take 2,000 mcg by mouth daily.    ? Diclofenac Sodium 3 % GEL Apply 1 application topically 2 (two) times daily as needed (for back pain). 100 g 0  ? KLOR-CON M20 20 MEQ tablet TAKE 1 TABLET DAILY 90 tablet 1  ? NIFEdipine (ADALAT CC) 60 MG 24 hr tablet Take 1 tablet (60 mg total) by mouth daily. 90 tablet 4  ? omeprazole (PRILOSEC) 20 MG capsule Take 1 capsule (20 mg total) by mouth daily. 90 capsule 4  ? raloxifene (EVISTA) 60 MG tablet Take 1 tablet (60 mg total) by mouth daily. 90 tablet 4  ? ?No current facility-administered medications for this visit.  ? ? ? ?PHYSICAL EXAMINATION: ?ECOG PERFORMANCE STATUS: 0 - Asymptomatic ?Vitals:  ? 03/04/22 1012  ?BP: 139/81  ?Pulse: (!) 58  ?Temp: (!) 97.1 ?F (36.2 ?C)  ? ?Filed Weights  ? 03/04/22 1012  ?Weight: 156 lb 11.2 oz (71.1 kg)  ? ? ?Physical Exam ?Constitutional:   ?   General: She is not in acute distress. ?   Comments: Patient ambulates independently  ?HENT:  ?   Head: Normocephalic and atraumatic.  ?Eyes:  ?   General: No scleral icterus. ?   Pupils: Pupils are equal, round, and reactive to light.  ?Cardiovascular:  ?   Rate and Rhythm: Normal rate and regular rhythm.  ?   Heart sounds: Normal heart sounds.  ?Pulmonary:  ?   Effort: Pulmonary effort is normal. No respiratory distress.  ?   Breath sounds: Normal breath sounds. No wheezing or rales.  ?Chest:  ?   Chest wall: No tenderness.  ?Abdominal:  ?   General: Bowel sounds are normal.  There is no distension.  ?   Palpations: Abdomen is soft. There is no mass.  ?   Tenderness: There is no abdominal tenderness.  ?Musculoskeletal:     ?   General: No deformity. Normal range of motion.  ?   Cervical back: Normal range of motion and neck supple.  ?Lymphadenopathy:  ?   Cervical: No cervical adenopathy.  ?Skin: ?   General: Skin is warm and dry.  ?   Findings: No erythema or rash.  ?Neurological:  ?   Mental Status: She is alert and oriented to person, place, and time. Mental status is at baseline.  ?   Cranial  Nerves: No cranial nerve deficit.  ?Psychiatric:     ?   Mood and Affect: Mood normal.  ? ? ? ?LABORATORY DATA:  ?I have reviewed the data as listed ?Lab Results  ?Component Value Date  ? WBC 6.2 02/25/2022  ? HGB 12.8 02/25/2022  ? HCT 38.1 02/25/2022  ? MCV 92.5 02/25/2022  ? PLT 152 02/25/2022  ? ?Recent Labs  ?  04/14/21 ?1207 10/18/21 ?1049 02/25/22 ?1110  ?NA 142 139 138  ?K 4.5 3.9 3.9  ?CL 103 104 105  ?CO2 '22 25 28  ' ?GLUCOSE 89 119* 96  ?BUN '17 13 14  ' ?CREATININE 1.07* 1.04* 0.95  ?CALCIUM 10.2 9.5 9.5  ?GFRNONAA  --   --  55*  ?PROT  --  6.1 7.2  ?ALBUMIN  --  4.0 3.8  ?AST  --  18 21  ?ALT  --  9 9  ?ALKPHOS  --  99 85  ?BILITOT  --  0.7 0.7  ? ? ? ?03/12/2018 multiple myeloma work up ? [place] [facility]: SPEP showed  monoclonal spike of 0.4.  Immunofixation showed elevated IgA monoclonal protein with kappa light chain specificity.Urine light chain ratio elevated at 12.54, no monoclonal spike on UPEP.  ? ?Lab Results  ?Component Value Date  ? TOTALPROTELP 6.4 02/25/2022  ? ALBUMINELP 3.6 10/19/2018  ? A1GS 0.3 10/19/2018  ? A2GS 0.6 10/19/2018  ? BETS 0.9 10/19/2018  ? GAMS 1.2 10/19/2018  ? MSPIKE Not Observed 10/19/2018  ? SPEI Comment 10/19/2018  ? ?Lab Results  ?Component Value Date  ? KPAFRELGTCHN 35.6 (H) 02/25/2022  ? LAMBDASER 22.1 02/25/2022  ? KAPLAMBRATIO 1.61 02/25/2022  ? ? ?ASSESSMENT & PLAN:  ?1. MGUS (monoclonal gammopathy of unknown significance)   ?2. Stage 3 chronic kidney  disease, unspecified whether stage 3a or 3b CKD ([place])   ? ?IgA MGUS with kappa light chain restriction. ?Labs reviewed and discussed with patient ?Patient has nondetectable M protein on protein electro

## 2022-03-30 ENCOUNTER — Other Ambulatory Visit: Payer: Self-pay | Admitting: Nurse Practitioner

## 2022-03-30 MED ORDER — ALLOPURINOL 100 MG PO TABS: 100.0000 mg | ORAL_TABLET | Freq: Every day | ORAL | 1 refills | Status: DC

## 2022-03-30 NOTE — Telephone Encounter (Signed)
Copied from [place] [phone]. Topic: General - Other ?>> Mar 30, 2022  4:19 PM Tessa Lerner A wrote: ?Reason for CRM: Medication Refill - Medication: allopurinol (ZYLOPRIM) 100 MG tablet [[id number]]  ? ?Has the patient contacted their pharmacy? Yes.  The patient has previously requested the medication through a different pharmacy  ?(Agent: If no, request that the patient contact the pharmacy for the refill. If patient does not wish to contact the pharmacy document the reason why and proceed with request.) ?(Agent: If yes, when and what did the pharmacy advise?) ? ?Preferred Pharmacy (with phone number or street name): [facility] [place] [facility] [place] [address] ?Phone: [phone] Fax: [phone] ?Hours: Not open 24 hours ? ?Has the patient been seen for an appointment in the last year OR does the patient have an upcoming appointment? Yes.   ? ?Agent: Please be advised that RX refills may take up to 3 business days. We ask that you follow-up with your pharmacy. ?

## 2022-03-30 NOTE — Telephone Encounter (Signed)
Requested Prescriptions  ?Pending Prescriptions Disp Refills  ?? allopurinol (ZYLOPRIM) 100 MG tablet 90 tablet 1  ?  Sig: Take 1 tablet (100 mg total) by mouth daily.  ?  ? Endocrinology:  Gout Agents - allopurinol Passed - 03/30/2022  5:11 PM  ?  ?  Passed - Uric Acid in normal range and within 360 days  ?  Uric Acid  ?Date Value Ref Range Status  ?10/18/2021 4.2 3.1 - 7.9 mg/dL Final  ?  Comment:  ?             Therapeutic target for gout patients: <6.0  ?   ?  ?  Passed - Cr in normal range and within 360 days  ?  Creatinine, Ser  ?Date Value Ref Range Status  ?02/25/2022 0.95 0.44 - 1.00 mg/dL Final  ?   ?  ?  Passed - Valid encounter within last 12 months  ?  Recent Outpatient Visits   ?      ? 5 months ago Stage 3a chronic kidney disease ([place])  ? [place], [place] T, NP  ? 11 months ago Aortic atherosclerosis ([place])  ? [place], Henrine Screws T, NP  ? 1 year ago Stage 3a chronic kidney disease ([place])  ? [place], [place] T, NP  ? 1 year ago Gout, unspecified cause, unspecified chronicity, unspecified site  ? [place], [place]  ? 1 year ago Chronic left-sided low back pain without sciatica  ? [facility], Henrine Screws T, NP  ?  ?  ?Future Appointments   ?        ? In 2 weeks [name], Barbaraann Faster, NP [facility], [facility]  ? In 4 months  [place]  ?  ? ?  ?  ?  Passed - CBC within normal limits and completed in the last 12 months  ?  WBC  ?Date Value Ref Range Status  ?02/25/2022 6.2 4.0 - 10.5 K/uL Final  ? ?RBC  ?Date Value Ref Range Status  ?02/25/2022 4.12 3.87 - 5.11 MIL/uL Final  ? ?Hemoglobin  ?Date Value Ref Range Status  ?02/25/2022 12.8 12.0 - 15.0 g/dL Final  ?10/18/2021 12.6 11.1 - 15.9 g/dL Final  ? ?HCT  ?Date Value Ref Range Status  ?02/25/2022 38.1 36.0 - 46.0 % Final  ? ?Hematocrit  ?Date Value Ref Range Status  ?10/18/2021 37.6 34.0 - 46.6 % Final  ? ?MCHC   ?Date Value Ref Range Status  ?02/25/2022 33.6 30.0 - 36.0 g/dL Final  ? ?MCH  ?Date Value Ref Range Status  ?02/25/2022 31.1 26.0 - 34.0 pg Final  ? ?MCV  ?Date Value Ref Range Status  ?02/25/2022 92.5 80.0 - 100.0 fL Final  ?10/18/2021 93 79 - 97 fL Final  ? ?No results found for: PLTCOUNTKUC, LABPLAT, [place] ?RDW  ?Date Value Ref Range Status  ?02/25/2022 14.2 11.5 - 15.5 % Final  ?10/18/2021 14.5 11.7 - 15.4 % Final  ? ?  ?  ?  ? ? ?

## 2022-04-11 DIAGNOSIS — Z20822 Contact with and (suspected) exposure to covid-19: Secondary | ICD-10-CM | POA: Diagnosis not present

## 2022-04-16 NOTE — Patient Instructions (Addendum)
Omeprazole try to reduce off of this as it can thin the bone out if taken every day.  Start taking every other day for one week and then go to every 3rd day for one week, etc. Until have come off the medication -- then use only as needed. ? ?Please call to schedule your mammogram and/or bone density: ?[facility]  ?Address: [address], [place], [place] [address] ?Phone: [phone] ? ? ?Chronic Kidney Disease, Adult ?Chronic kidney disease is when lasting damage happens to the kidneys slowly over a long time. The kidneys help to: ?Make pee (urine). ?Make hormones. ?Keep the right amount of fluids and chemicals in the body. ?Most often, this disease does not go away. You must take steps to help keep the kidney damage from getting worse. If steps are not taken, the kidneys might stop working forever. ?What are the causes? ?Diabetes. ?High blood pressure. ?Diseases that affect the heart and blood vessels. ?Other kidney diseases. ?Diseases of the body's disease-fighting system. ?A problem with the flow of pee. ?Infections of the organs that make pee, store it, and take it out of the body. ?Swelling or irritation of your blood vessels. ?What increases the risk? ?Getting older. ?Having someone in your family who has kidney disease or kidney failure. ?Having a disease caused by genes. ?Taking medicines often that harm the kidneys. ?Being near or having contact with harmful substances. ?Being very overweight. ?Using tobacco now or in the past. ?What are the signs or symptoms? ?Feeling very tired. ?Having a swollen face, legs, ankles, or feet. ?Feeling like you may vomit or vomiting. ?Not feeling hungry. ?Being confused or not able to focus. ?Twitches and cramps in the leg muscles or other muscles. ?Dry, itchy skin. ?A taste of metal in your mouth. ?Making less pee, or making more pee. ?Shortness of breath. ?Trouble sleeping. ?You may also become anemic or get weak bones.  Anemic means there is not enough red blood cells or hemoglobin in your blood. ?You may get symptoms slowly. You may not notice them until the kidney damage gets very bad. ?How is this treated? ?Often, there is no cure for this disease. Treatment can help with symptoms and help keep the disease from getting worse. You may need to: ?Avoid alcohol. ?Avoid foods that are high in salt, potassium, phosphorous, and protein. ?Take medicines for symptoms and to help control other conditions. ?Have dialysis. This treatment gets harmful waste out of your body. ?Treat other problems that cause your kidney disease or make it worse. ?Follow these instructions at home: ?Medicines ?Take over-the-counter and prescription medicines only as told by your doctor. ?Do not take any new medicines, vitamins, or supplements unless your doctor says it is okay. ?Lifestyle ? ?Do not smoke or use any products that contain nicotine or tobacco. If you need help quitting, ask your doctor. ?If you drink alcohol: ?Limit how much you use to: ?0-1 drink a day for women who are not pregnant. ?0-2 drinks a day for men. ?Know how much alcohol is in your drink. In the [place] one drink equals one 12 oz bottle of beer (355 mL), one 5 oz glass of wine (148 mL), or one 1? oz glass of hard liquor (44 mL). ?Stay at a healthy weight. If you need help losing weight, ask your doctor. ?General instructions ? ?Follow instructions from your doctor about what you cannot eat or drink. ?Track your blood pressure at home. Tell your doctor about  any changes. ?If you have diabetes, track your blood sugar. ?Exercise at least 30 minutes a day, 5 days a week. ?Keep your shots (vaccinations) up to date. ?Keep all follow-up visits. ?Where to find more information ?American Association of Kidney Patients: BombTimer.gl ?[place]: www.kidney.org ?[place]: https://mathis.com/ ?Life Options: www.lifeoptions.org ?Kidney School: www.kidneyschool.org ?Contact a  doctor if: ?Your symptoms get worse. ?You get new symptoms. ?Get help right away if: ?You get symptoms of end-stage kidney disease. These include: ?Headaches. ?Losing feeling in your hands or feet. ?Easy bruising. ?Having hiccups often. ?Chest pain. ?Shortness of breath. ?Lack of menstrual periods, in women. ?You have a fever. ?You make less pee than normal. ?You have pain or you bleed when you pee or poop. ?These symptoms may be an emergency. Get help right away. Call your local emergency services (911 in the [place] ?Do not wait to see if the symptoms will go away. ?Do not drive yourself to the hospital. ?Summary ?Chronic kidney disease is when lasting damage happens to the kidneys slowly over a long time. ?Causes of this disease include diabetes and high blood pressure. ?Often, there is no cure for this disease. Treatment can help symptoms and help keep the disease from getting worse. ?Treatment may involve lifestyle changes, medicines, and dialysis. ?This information is not intended to replace advice given to you by your health care provider. Make sure you discuss any questions you have with your health care provider. ?Document Revised: 03/04/2020 Document Reviewed: 03/04/2020 ?Elsevier Patient Education ? [place]. ? ?

## 2022-04-18 ENCOUNTER — Encounter: Payer: Self-pay | Admitting: Nurse Practitioner

## 2022-04-18 ENCOUNTER — Ambulatory Visit (INDEPENDENT_AMBULATORY_CARE_PROVIDER_SITE_OTHER): Payer: Medicare Other | Admitting: Nurse Practitioner

## 2022-04-18 VITALS — BP 125/69 | HR 59 | Temp 97.9°F | Ht 65.0 in | Wt 159.2 lb

## 2022-04-18 DIAGNOSIS — K219 Gastro-esophageal reflux disease without esophagitis: Secondary | ICD-10-CM

## 2022-04-18 DIAGNOSIS — D472 Monoclonal gammopathy: Secondary | ICD-10-CM | POA: Diagnosis not present

## 2022-04-18 DIAGNOSIS — M8589 Other specified disorders of bone density and structure, multiple sites: Secondary | ICD-10-CM

## 2022-04-18 DIAGNOSIS — M1A0710 Idiopathic chronic gout, right ankle and foot, without tophus (tophi): Secondary | ICD-10-CM | POA: Diagnosis not present

## 2022-04-18 DIAGNOSIS — E559 Vitamin D deficiency, unspecified: Secondary | ICD-10-CM

## 2022-04-18 DIAGNOSIS — I129 Hypertensive chronic kidney disease with stage 1 through stage 4 chronic kidney disease, or unspecified chronic kidney disease: Secondary | ICD-10-CM

## 2022-04-18 DIAGNOSIS — N1831 Chronic kidney disease, stage 3a: Secondary | ICD-10-CM

## 2022-04-18 DIAGNOSIS — I700 Atherosclerosis of aorta: Secondary | ICD-10-CM

## 2022-04-18 LAB — MICROALBUMIN, URINE WAIVED
Creatinine, Urine Waived: 200 mg/dL (ref 10–300)
Microalb, Ur Waived: 10 mg/L (ref 0–19)
Microalb/Creat Ratio: <30 mg/g (ref <30)

## 2022-04-18 MED ORDER — ALLOPURINOL 100 MG PO TABS
100.0000 mg | ORAL_TABLET | Freq: Every day | ORAL | 4 refills | Status: DC
Start: 2022-04-18 — End: 2023-02-15

## 2022-04-18 NOTE — Assessment & Plan Note (Signed)
Chronic, stable with Allopurinol at 100 MG.  Continue current regimen and adjust as needed.  Labs up to date. ?

## 2022-04-18 NOTE — Progress Notes (Signed)
? ?BP 125/69   Pulse (!) 59   Temp 97.9 ?F (36.6 ?C) (Oral)   Ht '5\' 5"'  (1.651 m)   Wt 159 lb 3.2 oz (72.2 kg)   LMP  (LMP Unknown)   SpO2 97%   BMI 26.49 kg/m?   ? ?Subjective:  ? ? Patient ID: Cynthia Hurst, female    DOB: [birth year], 87 y.o.   MRN: [id number] ? ?HPI: ?Cynthia Hurst is a 87 y.o. female ? ?Chief Complaint  ?Patient presents with  ? Hypertension  ? Osteopenia  ? Chronic Kidney Disease  ? ?Daughter present at bedside with her today. ? ?MGUS: ?Saw Dr. Tasia Catchings on 03/04/22.  Had renal ultrasound on 02/14/2020 with normal findings, right-sided renal cysts measuring 1.8 cm and questionable mild bladder wall thickening. She is to return to oncology in 6 months for follow-up.  Denies any B symptoms at this time. ? ?OSTEOPENIA ?Last DEXA several years ago and not on chart, done in office per reported.  Osteopenia on dx list.  Has been on Evista for years. ?Past osteoporosis medications/treatments:  ?Adequate calcium & vitamin D: yes ?Weight bearing exercises: yes  ? ?GERD ?Continues on Omeprazole daily at this time. ?GERD control status: stable  ?Satisfied with current treatment? yes ?Heartburn frequency: occasional ?Medication side effects: no  ?Medication compliance: stable ?Dysphagia: no ?Odynophagia:  no ?Hematemesis: no ?Blood in stool: no ?EGD: no ? ?HYPERTENSION ?Continues on Nifedipine and Atenolol.  Aortic atherosclerosis noted on imaging 03/22/2018.   ?Satisfied with current treatment? yes ?Duration of hypertension: chronic ?BP monitoring frequency: monthly ?BP range: unsure ?BP medication side effects: no ?Aspirin: no ?Recent stressors: no ?Recurrent headaches: no ?Visual changes: no ?Palpitations: no ?Dyspnea: no ?Chest pain: no ?Lower extremity edema: no ?Dizzy/lightheaded: no  ? ?CHRONIC KIDNEY DISEASE ?Continues on Allopurinol for gout history, has not had a flare in several years. ?CKD status: controlled ?Medications renally dose: yes ?Previous renal evaluation: no ?Pneumovax:   refused ?Influenza Vaccine:  Refused ? ?Relevant past medical, surgical, family and social history reviewed and updated as indicated. Interim medical history since our last visit reviewed. ?Allergies and medications reviewed and updated. ? ?Review of Systems  ?Constitutional:  Negative for activity change, appetite change, diaphoresis, fatigue and fever.  ?Respiratory:  Negative for cough, chest tightness and shortness of breath.   ?Cardiovascular:  Negative for chest pain, palpitations and leg swelling.  ?Gastrointestinal: Negative.   ?Musculoskeletal:  Negative for back pain.  ?Neurological: Negative.   ?Psychiatric/Behavioral: Negative.    ? ?Per HPI unless specifically indicated above ? ?   ?Objective:  ?  ?BP 125/69   Pulse (!) 59   Temp 97.9 ?F (36.6 ?C) (Oral)   Ht '5\' 5"'  (1.651 m)   Wt 159 lb 3.2 oz (72.2 kg)   LMP  (LMP Unknown)   SpO2 97%   BMI 26.49 kg/m?   ?Wt Readings from Last 3 Encounters:  ?04/18/22 159 lb 3.2 oz (72.2 kg)  ?03/04/22 156 lb 11.2 oz (71.1 kg)  ?10/18/21 150 lb 3.2 oz (68.1 kg)  ?  ?Physical Exam ?Vitals and nursing note reviewed.  ?Constitutional:   ?   General: She is awake. She is not in acute distress. ?   Appearance: She is well-developed and well-groomed. She is not ill-appearing.  ?HENT:  ?   Head: Normocephalic.  ?   Right Ear: Hearing normal.  ?   Left Ear: Hearing normal.  ?Eyes:  ?   General: Lids are normal.     ?  Right eye: No discharge.     ?   Left eye: No discharge.  ?   Conjunctiva/sclera: Conjunctivae normal.  ?   Pupils: Pupils are equal, round, and reactive to light.  ?Neck:  ?   Thyroid: No thyromegaly.  ?   Vascular: No carotid bruit.  ?Cardiovascular:  ?   Rate and Rhythm: Normal rate and regular rhythm.  ?   Heart sounds: Normal heart sounds. No murmur heard. ?  No gallop.  ?Pulmonary:  ?   Effort: Pulmonary effort is normal. No accessory muscle usage or respiratory distress.  ?   Breath sounds: Normal breath sounds.  ?Abdominal:  ?   General: Bowel  sounds are normal.  ?   Palpations: Abdomen is soft.  ?   Tenderness: There is no abdominal tenderness.  ?Musculoskeletal:  ?   Cervical back: Normal range of motion and neck supple.  ?   Right lower leg: No edema.  ?   Left lower leg: No edema.  ?   Comments: Kyphosis present.  ?Skin: ?   General: Skin is warm and dry.  ?Neurological:  ?   Mental Status: She is alert and oriented to person, place, and time.  ?Psychiatric:     ?   Attention and Perception: Attention normal.     ?   Mood and Affect: Mood normal.     ?   Speech: Speech normal.     ?   Behavior: Behavior normal. Behavior is cooperative.     ?   Thought Content: Thought content normal.  ? ? ?Results for orders placed or performed in visit on 02/25/22  ?Multiple Myeloma Panel (SPEP&IFE w/QIG)  ?Result Value Ref Range  ? IgG (Immunoglobin G), Serum 1,144 586 - 1,602 mg/dL  ? IgA 197 64 - 422 mg/dL  ? IgM (Immunoglobulin M), Srm 238 (H) 26 - 217 mg/dL  ? Total Protein ELP 6.4 6.0 - 8.5 g/dL  ? Albumin SerPl Elph-Mcnc 3.4 2.9 - 4.4 g/dL  ? Alpha 1 0.3 0.0 - 0.4 g/dL  ? Alpha2 Glob SerPl Elph-Mcnc 0.6 0.4 - 1.0 g/dL  ? B-Globulin SerPl Elph-Mcnc 0.9 0.7 - 1.3 g/dL  ? Gamma Glob SerPl Elph-Mcnc 1.2 0.4 - 1.8 g/dL  ? M Protein SerPl Elph-Mcnc Not Observed Not Observed g/dL  ? Globulin, Total 3.0 2.2 - 3.9 g/dL  ? Albumin/Glob SerPl 1.2 0.7 - 1.7  ? IFE 1 Comment (A)   ? Please Note Comment   ?Kappa/lambda light chains  ?Result Value Ref Range  ? Kappa free light chain 35.6 (H) 3.3 - 19.4 mg/L  ? Lambda free light chains 22.1 5.7 - 26.3 mg/L  ? Kappa, lambda light chain ratio 1.61 0.26 - 1.65  ?Comprehensive metabolic panel  ?Result Value Ref Range  ? Sodium 138 135 - 145 mmol/L  ? Potassium 3.9 3.5 - 5.1 mmol/L  ? Chloride 105 98 - 111 mmol/L  ? CO2 28 22 - 32 mmol/L  ? Glucose, Bld 96 70 - 99 mg/dL  ? BUN 14 8 - 23 mg/dL  ? Creatinine, Ser 0.95 0.44 - 1.00 mg/dL  ? Calcium 9.5 8.9 - 10.3 mg/dL  ? Total Protein 7.2 6.5 - 8.1 g/dL  ? Albumin 3.8 3.5 - 5.0 g/dL   ? AST 21 15 - 41 U/L  ? ALT 9 0 - 44 U/L  ? Alkaline Phosphatase 85 38 - 126 U/L  ? Total Bilirubin 0.7 0.3 - 1.2 mg/dL  ? GFR, Estimated  59 (L) >60 mL/min  ? Anion gap 5 5 - 15  ?CBC with Differential/Platelet  ?Result Value Ref Range  ? WBC 6.2 4.0 - 10.5 K/uL  ? RBC 4.12 3.87 - 5.11 MIL/uL  ? Hemoglobin 12.8 12.0 - 15.0 g/dL  ? HCT 38.1 36.0 - 46.0 %  ? MCV 92.5 80.0 - 100.0 fL  ? MCH 31.1 26.0 - 34.0 pg  ? MCHC 33.6 30.0 - 36.0 g/dL  ? RDW 14.2 11.5 - 15.5 %  ? Platelets 152 150 - 400 K/uL  ? nRBC 0.0 0.0 - 0.2 %  ? Neutrophils Relative % 64 %  ? Neutro Abs 3.9 1.7 - 7.7 K/uL  ? Lymphocytes Relative 26 %  ? Lymphs Abs 1.6 0.7 - 4.0 K/uL  ? Monocytes Relative 9 %  ? Monocytes Absolute 0.6 0.1 - 1.0 K/uL  ? Eosinophils Relative 1 %  ? Eosinophils Absolute 0.1 0.0 - 0.5 K/uL  ? Basophils Relative 0 %  ? Basophils Absolute 0.0 0.0 - 0.1 K/uL  ? Immature Granulocytes 0 %  ? Abs Immature Granulocytes 0.02 0.00 - 0.07 K/uL  ? ?   ?Assessment & Plan:  ? ?Problem List Items Addressed This Visit   ? ?  ? Cardiovascular and Mediastinum  ? Aortic atherosclerosis ([place])  ?  Noted on imaging on 03/22/2018.  At this time will not initiate statin due to advanced and patient wishes.  Recommend daily Baby ASA 81 MG for prevention. ? ?  ?  ? Benign hypertension with chronic kidney disease  ?  Chronic, ongoing.  BP below goal in office today for age.  Continue current medication regimen and adjust as needed -- could consider reduction of medication in future if BP remains stable.  LABS: CMP up to date with oncology, TSH and urine ALB today (urine ALB 10).  Renal dose medications as needed.  Recommend she continue to monitor BP at home at least a few days a week and document + focus on DASH diet.  Return in 6 months. ? ?  ?  ? Relevant Orders  ? Lipid Panel w/o Chol/HDL Ratio  ? TSH  ? Microalbumin, Urine Waived  ?  ? Digestive  ? GERD (gastroesophageal reflux disease)  ?  Chronic, stable.  Recommend she reduce use of this due to her  age and osteopenia.  Discussed slow reduction off and then to use as needed.  Was able to verbalize back understanding. ? ?  ?  ?  ? Musculoskeletal and Integument  ? Osteopenia  ?  Present to lumbar and thoracic spine.  Continue d

## 2022-04-18 NOTE — Assessment & Plan Note (Signed)
Chronic, ongoing.  BP below goal in office today for age.  Continue current medication regimen and adjust as needed -- could consider reduction of medication in future if BP remains stable.  LABS: CMP up to date with oncology, TSH and urine ALB today (urine ALB 10).  Renal dose medications as needed.  Recommend she continue to monitor BP at home at least a few days a week and document + focus on DASH diet.  Return in 6 months. ?

## 2022-04-18 NOTE — Assessment & Plan Note (Addendum)
Chronic, ongoing.  Continue collaboration with oncology, recent note and labs reviewed. ?

## 2022-04-18 NOTE — Assessment & Plan Note (Signed)
Chronic, stable.  Continue daily supplement and adjust as needed.  Labs today. 

## 2022-04-18 NOTE — Assessment & Plan Note (Signed)
Chronic with CKD 3a.  Monitor CMP every 6 months (up to date) and refer to nephrology as needed if worsening CKD.  Renal dose medications as needed.   ?

## 2022-04-18 NOTE — Assessment & Plan Note (Signed)
Chronic, stable.  Recommend she reduce use of this due to her age and osteopenia.  Discussed slow reduction off and then to use as needed.  Was able to verbalize back understanding. ?

## 2022-04-18 NOTE — Assessment & Plan Note (Signed)
Noted on imaging on 03/22/2018.  At this time will not initiate statin due to advanced and patient wishes.  Recommend daily Baby ASA 81 MG for prevention. ?

## 2022-04-18 NOTE — Assessment & Plan Note (Signed)
Present to lumbar and thoracic spine.  Continue daily Vit D3 and Calcium supplements + Evista.  Recommend continued daily PT exercises for strengthening at home.  Check Vit D. DEXA ordered and discussed with patient. ?

## 2022-04-19 LAB — TSH: TSH: 2.97 u[IU]/mL (ref 0.450–4.500)

## 2022-04-19 LAB — LIPID PANEL W/O CHOL/HDL RATIO
Cholesterol, Total: 191 mg/dL (ref 100–199)
HDL: 68 mg/dL (ref >39)
LDL Chol Calc (NIH): 103 mg/dL — ABNORMAL HIGH (ref 0–99)
Triglycerides: 111 mg/dL (ref 0–149)
VLDL Cholesterol Cal: 20 mg/dL (ref 5–40)

## 2022-04-19 LAB — VITAMIN D 25 HYDROXY (VIT D DEFICIENCY, FRACTURES): Vit D, 25-Hydroxy: 33.2 ng/mL (ref 30.0–100.0)

## 2022-04-19 NOTE — Progress Notes (Signed)
Please let Pecola know her labs have returned, Vitamin D and thyroid labs are normal. Lipid panel shows mild elevation in LDL, bad cholesterol, but at this point I recommend focus on diet only with reduction of saturated fats and fast foods + red meat.  No medication changes needed.  Have a great day!! ?Keep being amazing!!  Thank you for allowing me to participate in your care.  I appreciate you. ?Kindest regards, ?Connell Bognar ?

## 2022-04-21 ENCOUNTER — Other Ambulatory Visit: Payer: Self-pay | Admitting: Nurse Practitioner

## 2022-04-21 NOTE — Telephone Encounter (Signed)
Requested Prescriptions  ?Pending Prescriptions Disp Refills  ?? KLOR-CON M20 20 MEQ tablet [Pharmacy Med Name: POTASSIUM CHLORIDE ER (DISP) TABS 20MEQ] 90 tablet 1  ?  Sig: TAKE 1 TABLET DAILY  ?  ? Endocrinology:  Minerals - Potassium Supplementation Passed - 04/21/2022  1:31 AM  ?  ?  Passed - K in normal range and within 360 days  ?  Potassium  ?Date Value Ref Range Status  ?02/25/2022 3.9 3.5 - 5.1 mmol/L Final  ?   ?  ?  Passed - Cr in normal range and within 360 days  ?  Creatinine, Ser  ?Date Value Ref Range Status  ?02/25/2022 0.95 0.44 - 1.00 mg/dL Final  ?   ?  ?  Passed - Valid encounter within last 12 months  ?  Recent Outpatient Visits   ?      ? 3 days ago Stage 3a chronic kidney disease ([place])  ? [place], [place] T, NP  ? 6 months ago Stage 3a chronic kidney disease ([place])  ? [place], Henrine Screws T, NP  ? 1 year ago Aortic atherosclerosis ([place])  ? [place], Henrine Screws T, NP  ? 1 year ago Stage 3a chronic kidney disease ([place])  ? [place], [place] T, NP  ? 1 year ago Gout, unspecified cause, unspecified chronicity, unspecified site  ? [place], [place], [place]  ?  ?  ?Future Appointments   ?        ? In 3 months  [facility]  ? In 6 months [name], Barbaraann Faster, NP [facility], [facility]  ?  ? ?  ?  ?  ? ? ?

## 2022-06-13 ENCOUNTER — Ambulatory Visit
Admission: RE | Admit: 2022-06-13 | Discharge: 2022-06-13 | Disposition: A | Discharge status: Home / Self Care | Payer: Medicare Other | Source: Ambulatory Visit | Attending: Nurse Practitioner | Admitting: Nurse Practitioner

## 2022-06-13 ENCOUNTER — Encounter: Payer: Self-pay | Admitting: Nurse Practitioner

## 2022-06-13 DIAGNOSIS — M8589 Other specified disorders of bone density and structure, multiple sites: Secondary | ICD-10-CM | POA: Insufficient documentation

## 2022-06-13 DIAGNOSIS — M810 Age-related osteoporosis without current pathological fracture: Secondary | ICD-10-CM | POA: Diagnosis not present

## 2022-06-13 DIAGNOSIS — M85852 Other specified disorders of bone density and structure, left thigh: Secondary | ICD-10-CM | POA: Diagnosis not present

## 2022-06-13 NOTE — Progress Notes (Signed)
Please let Cynthia Hurst know her DEXA, bone scan, has returned and is showing osteoporosis.  This means the bones are more brittle and could break easily.  At this time I want her to continue Evista which is protective medication with osteoporosis.  Also ensure 2000 units of Vitamin D daily and calcium intake.  Any questions? Keep being stellar!!  Thank you for allowing me to participate in your care.  I appreciate you. Kindest regards, Bindi Klomp

## 2022-06-22 ENCOUNTER — Ambulatory Visit: Payer: Self-pay | Admitting: *Deleted

## 2022-06-22 NOTE — Telephone Encounter (Signed)
Pt's daughter called and has med questions, wants to know how to administer all 6 meds with meals   Best contact: [phone]   Chief Complaint: Med question Symptoms: Pt's daughter wishes to review meds, times to administer and dosages. Reviewed  to daughters satisfaction. Frequency:  Pertinent Negatives: Patient denies  Disposition: '[]'$ ED /'[]'$ Urgent Care (no appt availability in office) / '[]'$ Appointment(In office/virtual)/ '[]'$  [place] [facility]/ '[]'$ Home Care/ '[]'$ Refused Recommended Disposition /'[]'$ [place] [facility]/ '[]'$  Follow-up with PCP Additional Notes: Advised to CB for any addition questions Reason for Disposition  Caller has medicine question only, adult not sick, AND triager answers question  Answer Assessment - Initial Assessment Questions 1. NAME of MEDICATION: "What medicine are you calling about?"     all 2. QUESTION: "What is your question?" (e.g., double dose of medicine, side effect) Review meds  Protocols used: Medication Question Call-A-AH

## 2022-08-03 ENCOUNTER — Ambulatory Visit: Payer: Medicare Other

## 2022-08-04 ENCOUNTER — Ambulatory Visit (INDEPENDENT_AMBULATORY_CARE_PROVIDER_SITE_OTHER): Payer: Medicare Other | Admitting: *Deleted

## 2022-08-04 DIAGNOSIS — Z0000 Encounter for general adult medical examination without abnormal findings: Secondary | ICD-10-CM | POA: Diagnosis not present

## 2022-08-04 NOTE — Progress Notes (Signed)
Subjective:   Cynthia Hurst is a 87 y.o. female who presents for Medicare Annual (Subsequent) preventive examination.  I connected with  [name] on 08/04/22 by a telephone enabled telemedicine application and verified that I am speaking with the correct person using two identifiers.   I discussed the limitations of evaluation and management by telemedicine. The patient expressed understanding and agreed to proceed.  Patient location: home  Provider location:  Tele-Health-home    Review of Systems      Cardiac Risk Factors include: advanced age (>61mn, >>75women);sedentary lifestyle;hypertension     Objective:    Today's Vitals   There is no height or weight on file to calculate BMI.     08/04/2022   12:36 PM 03/04/2022   10:08 AM 08/02/2021   11:20 AM 08/20/2020    9:44 AM 07/27/2020   11:21 AM 02/07/2020    4:58 PM 08/08/2019   10:40 AM  Advanced Directives  Does Patient Have a Medical Advance Directive? No Yes Yes No No No No  Type of AScientist, physiologicalof [place] will      Copy of [place]?   No - copy requested      Would patient like information on creating a medical advance directive? No - Patient declined   No - Patient declined       Current Medications (verified) Outpatient Encounter Medications as of 08/04/2022  Medication Sig   allopurinol (ZYLOPRIM) 100 MG tablet Take 1 tablet (100 mg total) by mouth daily.   atenolol (TENORMIN) 100 MG tablet Take 1 tablet (100 mg total) by mouth daily.   cholecalciferol (VITAMIN D) 1000 UNITS tablet Take 1,000 Units by mouth daily.   cyanocobalamin 2000 MCG tablet Take 2,000 mcg by mouth daily.   Diclofenac Sodium 3 % GEL Apply 1 application topically 2 (two) times daily as needed (for back pain).   KLOR-CON M20 20 MEQ tablet TAKE 1 TABLET DAILY   NIFEdipine (ADALAT CC) 60 MG 24 hr tablet Take 1 tablet (60 mg total) by mouth daily.   omeprazole (PRILOSEC) 20 MG  capsule Take 1 capsule (20 mg total) by mouth daily.   raloxifene (EVISTA) 60 MG tablet Take 1 tablet (60 mg total) by mouth daily.   No facility-administered encounter medications on file as of 08/04/2022.    Allergies (verified) Patient has no known allergies.   History: Past Medical History:  Diagnosis Date   Chronic kidney disease    GERD (gastroesophageal reflux disease)    Gout    Hypertension    Hypopotassemia    Osteoarthritis    Osteoporosis    LUMBAR SPINE   Vitamin B 12 deficiency    Vitamin D deficiency    Past Surgical History:  Procedure Laterality Date   ABDOMINAL HYSTERECTOMY     PARTIAL   EYE SURGERY Left 05/2018   cataract surgery    JOINT REPLACEMENT Bilateral 2010,2011   KNEE   Family History  Problem Relation Age of Onset   Heart disease Mother    Cancer Father    Alzheimer's disease Father    Breast cancer Neg Hx    Social History   Socioeconomic History   Marital status: Married    Spouse name: Not on file   Number of children: Not on file   Years of education: Not on file   Highest education level: High school graduate  Occupational History   Not on  file  Tobacco Use   Smoking status: Never   Smokeless tobacco: Never  Vaping Use   Vaping Use: Never used  Substance and Sexual Activity   Alcohol use: No   Drug use: No   Sexual activity: Yes  Other Topics Concern   Not on file  Social History Narrative   Not on file   Social Determinants of Health   Financial Resource Strain: Low Risk  (08/04/2022)   Overall Financial Resource Strain (CARDIA)    Difficulty of Paying Living Expenses: Not hard at all  Food Insecurity: No Food Insecurity (08/04/2022)   Hunger Vital Sign    Worried About Running Out of Food in the Last Year: Never true    Ran Out of Food in the Last Year: Never true  Transportation Needs: No Transportation Needs (08/04/2022)   PRAPARE - Hydrologist (Medical): No    Lack of  Transportation (Non-Medical): No  Physical Activity: Inactive (08/04/2022)   Exercise Vital Sign    Days of Exercise per Week: 0 days    Minutes of Exercise per Session: 0 min  Stress: No Stress Concern Present (08/04/2022)   [place]    Feeling of Stress : Not at all  Social Connections: Moderately Integrated (08/04/2022)   Social Connection and Isolation Panel [NHANES]    Frequency of Communication with Friends and Family: More than three times a week    Frequency of Social Gatherings with Friends and Family: More than three times a week    Attends Religious Services: More than 4 times per year    Active Member of [employer] or Organizations: No    Attends Music therapist: Never    Marital Status: Married    Tobacco Counseling Counseling given: Not Answered   Clinical Intake:  Pre-visit preparation completed: Yes  Pain : No/denies pain     Nutritional Risks: None Diabetes: No  How often do you need to have someone help you when you read instructions, pamphlets, or other written materials from your doctor or pharmacy?: 2 - Rarely  Diabetic?  no  Interpreter Needed?: No  Information entered by :: Leroy Kennedy LPN   Activities of Daily Living    08/04/2022   12:41 PM  In your present state of health, do you have any difficulty performing the following activities:  Hearing? 0  Vision? 0  Difficulty concentrating or making decisions? 0  Walking or climbing stairs? 0  Dressing or bathing? 0  Doing errands, shopping? 0  Preparing Food and eating ? N  Using the Toilet? N  In the past six months, have you accidently leaked urine? N  Do you have problems with loss of bowel control? N  Managing your Medications? N  Managing your Finances? N  Housekeeping or managing your Housekeeping? N    Patient Care Team: Venita Lick, NP as PCP - General (Nurse Practitioner) Marlowe Sax,  MD as Referring Physician (Internal Medicine) Earlie Server, MD as Consulting Physician (Oncology)  Indicate any recent Medical Services you may have received from other than [facility] providers in the past year (date may be approximate).     Assessment:   This is a routine wellness examination for Tiro.  Hearing/Vision screen Hearing Screening - Comments:: No trouble hearing Vision Screening - Comments:: Not up to date [place] issues and exercise activities discussed: Current Exercise Habits: The patient does not participate in regular  exercise at present   Goals Addressed             This Visit's Progress    Patient Stated       No goals      Depression Screen    08/04/2022   12:45 PM 04/18/2022   10:14 AM 10/18/2021   10:55 AM 08/02/2021   11:22 AM 07/27/2020   11:23 AM 07/03/2019    8:06 AM 06/27/2018    8:18 AM  PHQ 2/9 Scores  PHQ - 2 Score 0 0 0 0 0 0 0  PHQ- 9 Score  0 0  0      Fall Risk    08/04/2022   12:36 PM 04/18/2022   10:14 AM 08/02/2021   11:21 AM 07/27/2020   11:22 AM 11/05/2019    6:24 PM  Ethel in the past year? 0 0 0 0 0  Comment     Emmi Telephone Survey: data to providers prior to load  Number falls in past yr: 0 0     Injury with Fall? 0 0     Risk for fall due to :  No Fall Risks Medication side effect Medication side effect   Follow up Falls evaluation completed;Education provided;Falls prevention discussed Falls evaluation completed Falls evaluation completed;Education provided;Falls prevention discussed Falls evaluation completed;Education provided;Falls prevention discussed     FALL RISK PREVENTION PERTAINING TO THE HOME:  Any stairs in or around the home? No  If so, are there any without handrails? No  Home free of loose throw rugs in walkways, pet beds, electrical cords, etc? Yes  Adequate lighting in your home to reduce risk of falls? Yes   ASSISTIVE DEVICES UTILIZED TO PREVENT FALLS:  Life alert? No  Use of a  cane, walker or w/c? No  Grab bars in the bathroom? No  Shower chair or bench in shower? Yes  Elevated toilet seat or a handicapped toilet? No   TIMED UP AND GO:  Was the test performed? No .    Cognitive Function:        08/04/2022   12:37 PM 08/02/2021   11:22 AM 07/27/2020   11:25 AM 07/03/2019    8:11 AM 06/27/2018    8:20 AM  6CIT Screen  What Year? 4 points 0 points 0 points 0 points 0 points  What month? 0 points 0 points 0 points 0 points 0 points  What time? 0 points 0 points 0 points 0 points 0 points  Count back from 20 0 points 0 points 0 points 0 points 0 points  Months in reverse 2 points 2 points 4 points 0 points 0 points  Repeat phrase 10 points 2 points 0 points 0 points 2 points  Total Score 16 points 4 points 4 points 0 points 2 points    Immunizations Immunization History  Administered Date(s) Administered   Fluad Quad(high Dose 65+) 10/01/2020, 10/18/2021   Moderna Sars-Covid-2 Vaccination 01/24/2020, 02/21/2020   Pneumococcal Conjugate-13 10/18/2021   Pneumococcal Polysaccharide-23 03/23/2005   Td 03/23/2005    TDAP status: Due, Education has been provided regarding the importance of this vaccine. Advised may receive this vaccine at local pharmacy or Health Dept. Aware to provide a copy of the vaccination record if obtained from local pharmacy or Health Dept. Verbalized acceptance and understanding.  Flu Vaccine status: Up to date  Pneumococcal vaccine status: Up to date  Covid-19 vaccine status: Information provided on how to obtain vaccines.  Qualifies for Shingles Vaccine? Yes   Zostavax completed No   Shingrix Completed?: No.    Education has been provided regarding the importance of this vaccine. Patient has been advised to call insurance company to determine out of pocket expense if they have not yet received this vaccine. Advised may also receive vaccine at local pharmacy or Health Dept. Verbalized acceptance and understanding.  Screening  Tests Health Maintenance  Topic Date Due   Zoster Vaccines- Shingrix (1 of 2) Never done   COVID-19 Vaccine (3 - Moderna risk series) 03/20/2020   INFLUENZA VACCINE  07/12/2022   TETANUS/TDAP  10/18/2022 (Originally 03/24/2015)   DEXA SCAN  06/14/2027   Pneumonia Vaccine 75+ Years old  Completed   HPV VACCINES  Aged Out    Health Maintenance  Health Maintenance Due  Topic Date Due   Zoster Vaccines- Shingrix (1 of 2) Never done   COVID-19 Vaccine (3 - Moderna risk series) 03/20/2020   INFLUENZA VACCINE  07/12/2022    Colorectal cancer screening: No longer required.   Mammogram status: No longer required due to  .  Bone Density status: Completed 2023. Results reflect: Bone density results: OSTEOPENIA. Repeat every 2 years.  Lung Cancer Screening: (Low Dose CT Chest recommended if Age 13-80 years, 30 pack-year currently smoking OR have quit w/in 15years.) does not qualify.   Lung Cancer Screening Referral:   Additional Screening:  Hepatitis C Screening: does not qualify; Completed   Vision Screening: Recommended annual ophthalmology exams for early detection of glaucoma and other disorders of the eye. Is the patient up to date with their annual eye exam?  Yes  Who is the provider or what is the name of the office in which the patient attends annual eye exams? Unsure of name If pt is not established with a provider, would they like to be referred to a provider to establish care? No .   Dental Screening: Recommended annual dental exams for proper oral hygiene  Community Resource Referral / Chronic Care Management: CRR required this visit?  No   CCM required this visit?  No      Plan:     I have personally reviewed and noted the following in the patient's chart:   Medical and social history Use of alcohol, tobacco or illicit drugs  Current medications and supplements including opioid prescriptions. Patient is not currently taking opioid prescriptions. Functional  ability and status Nutritional status Physical activity Advanced directives List of other physicians Hospitalizations, surgeries, and ER visits in previous 12 months Vitals Screenings to include cognitive, depression, and falls Referrals and appointments  In addition, I have reviewed and discussed with patient certain preventive protocols, quality metrics, and best practice recommendations. A written personalized care plan for preventive services as well as general preventive health recommendations were provided to patient.     Leroy Kennedy, LPN   3/38/2505   Nurse Notes:

## 2022-08-04 NOTE — Patient Instructions (Signed)
Cynthia Hurst , Thank you for taking time to come for your Medicare Wellness Visit. I appreciate your ongoing commitment to your health goals. Please review the following plan we discussed and let me know if I can assist you in the future.   Screening recommendations/referrals: Colonoscopy: no longer required Mammogram: no longer required  Bone Density: up to date Recommended yearly ophthalmology/optometry visit for glaucoma screening and checkup Recommended yearly dental visit for hygiene and checkup  Vaccinations: Influenza vaccine: up to date Pneumococcal vaccine: up to date Tdap vaccine: Education provided Shingles vaccine: Education provided    Advanced directives: Education provided    Next appointment: 10-19-2022 @ 9:00  Cynthia Hurst   Preventive Care 7 Years and Older, Female Preventive care refers to lifestyle choices and visits with your health care provider that can promote health and wellness. What does preventive care include? A yearly physical exam. This is also called an annual well check. Dental exams once or twice a year. Routine eye exams. Ask your health care provider how often you should have your eyes checked. Personal lifestyle choices, including: Daily care of your teeth and gums. Regular physical activity. Eating a healthy diet. Avoiding tobacco and drug use. Limiting alcohol use. Practicing safe sex. Taking low-dose aspirin every day. Taking vitamin and mineral supplements as recommended by your health care provider. What happens during an annual well check? The services and screenings done by your health care provider during your annual well check will depend on your age, overall health, lifestyle risk factors, and family history of disease. Counseling  Your health care provider may ask you questions about your: Alcohol use. Tobacco use. Drug use. Emotional well-being. Home and relationship well-being. Sexual activity. Eating habits. History of  falls. Memory and ability to understand (cognition). Work and work Statistician. Reproductive health. Screening  You may have the following tests or measurements: Height, weight, and BMI. Blood pressure. Lipid and cholesterol levels. These may be checked every 5 years, or more frequently if you are over 85 years old. Skin check. Lung cancer screening. You may have this screening every year starting at age 64 if you have a 30-pack-year history of smoking and currently smoke or have quit within the past 15 years. Fecal occult blood test (FOBT) of the stool. You may have this test every year starting at age 78. Flexible sigmoidoscopy or colonoscopy. You may have a sigmoidoscopy every 5 years or a colonoscopy every 10 years starting at age 75. Hepatitis C blood test. Hepatitis B blood test. Sexually transmitted disease (STD) testing. Diabetes screening. This is done by checking your blood sugar (glucose) after you have not eaten for a while (fasting). You may have this done every 1-3 years. Bone density scan. This is done to screen for osteoporosis. You may have this done starting at age 55. Mammogram. This may be done every 1-2 years. Talk to your health care provider about how often you should have regular mammograms. Talk with your health care provider about your test results, treatment options, and if necessary, the need for more tests. Vaccines  Your health care provider may recommend certain vaccines, such as: Influenza vaccine. This is recommended every year. Tetanus, diphtheria, and acellular pertussis (Tdap, Td) vaccine. You may need a Td booster every 10 years. Zoster vaccine. You may need this after age 64. Pneumococcal 13-valent conjugate (PCV13) vaccine. One dose is recommended after age 10. Pneumococcal polysaccharide (PPSV23) vaccine. One dose is recommended after age 19. Talk to your health care provider about  which screenings and vaccines you need and how often you need  them. This information is not intended to replace advice given to you by your health care provider. Make sure you discuss any questions you have with your health care provider. Document Released: 12/25/2015 Document Revised: 08/17/2016 Document Reviewed: 09/29/2015 Elsevier Interactive Patient Education  2017 [employer] Prevention in the Home Falls can cause injuries. They can happen to people of all ages. There are many things you can do to make your home safe and to help prevent falls. What can I do on the outside of my home? Regularly fix the edges of walkways and driveways and fix any cracks. Remove anything that might make you trip as you walk through a door, such as a raised step or threshold. Trim any bushes or trees on the path to your home. Use bright outdoor lighting. Clear any walking paths of anything that might make someone trip, such as rocks or tools. Regularly check to see if handrails are loose or broken. Make sure that both sides of any steps have handrails. Any raised decks and porches should have guardrails on the edges. Have any leaves, snow, or ice cleared regularly. Use sand or salt on walking paths during winter. Clean up any spills in your garage right away. This includes oil or grease spills. What can I do in the bathroom? Use night lights. Install grab bars by the toilet and in the tub and shower. Do not use towel bars as grab bars. Use non-skid mats or decals in the tub or shower. If you need to sit down in the shower, use a plastic, non-slip stool. Keep the floor dry. Clean up any water that spills on the floor as soon as it happens. Remove soap buildup in the tub or shower regularly. Attach bath mats securely with double-sided non-slip rug tape. Do not have throw rugs and other things on the floor that can make you trip. What can I do in the bedroom? Use night lights. Make sure that you have a light by your bed that is easy to reach. Do not use  any sheets or blankets that are too big for your bed. They should not hang down onto the floor. Have a firm chair that has side arms. You can use this for support while you get dressed. Do not have throw rugs and other things on the floor that can make you trip. What can I do in the kitchen? Clean up any spills right away. Avoid walking on wet floors. Keep items that you use a lot in easy-to-reach places. If you need to reach something above you, use a strong step stool that has a grab bar. Keep electrical cords out of the way. Do not use floor polish or wax that makes floors slippery. If you must use wax, use non-skid floor wax. Do not have throw rugs and other things on the floor that can make you trip. What can I do with my stairs? Do not leave any items on the stairs. Make sure that there are handrails on both sides of the stairs and use them. Fix handrails that are broken or loose. Make sure that handrails are as long as the stairways. Check any carpeting to make sure that it is firmly attached to the stairs. Fix any carpet that is loose or worn. Avoid having throw rugs at the top or bottom of the stairs. If you do have throw rugs, attach them to the floor with  carpet tape. Make sure that you have a light switch at the top of the stairs and the bottom of the stairs. If you do not have them, ask someone to add them for you. What else can I do to help prevent falls? Wear shoes that: Do not have high heels. Have rubber bottoms. Are comfortable and fit you well. Are closed at the toe. Do not wear sandals. If you use a stepladder: Make sure that it is fully opened. Do not climb a closed stepladder. Make sure that both sides of the stepladder are locked into place. Ask someone to hold it for you, if possible. Clearly mark and make sure that you can see: Any grab bars or handrails. First and last steps. Where the edge of each step is. Use tools that help you move around (mobility aids)  if they are needed. These include: Canes. Walkers. Scooters. Crutches. Turn on the lights when you go into a dark area. Replace any light bulbs as soon as they burn out. Set up your furniture so you have a clear path. Avoid moving your furniture around. If any of your floors are uneven, fix them. If there are any pets around you, be aware of where they are. Review your medicines with your doctor. Some medicines can make you feel dizzy. This can increase your chance of falling. Ask your doctor what other things that you can do to help prevent falls. This information is not intended to replace advice given to you by your health care provider. Make sure you discuss any questions you have with your health care provider. Document Released: 09/24/2009 Document Revised: 05/05/2016 Document Reviewed: 01/02/2015 Elsevier Interactive Patient Education  2017 [employer].

## 2022-08-05 ENCOUNTER — Ambulatory Visit: Payer: Medicare Other

## 2022-09-25 NOTE — Patient Instructions (Incomplete)

## 2022-09-27 ENCOUNTER — Encounter: Payer: Self-pay | Admitting: Nurse Practitioner

## 2022-09-27 ENCOUNTER — Ambulatory Visit (INDEPENDENT_AMBULATORY_CARE_PROVIDER_SITE_OTHER): Payer: Medicare Other | Admitting: Nurse Practitioner

## 2022-09-27 VITALS — BP 127/71 | HR 62 | Temp 97.9°F | Ht 65.0 in | Wt 163.9 lb

## 2022-09-27 DIAGNOSIS — D472 Monoclonal gammopathy: Secondary | ICD-10-CM

## 2022-09-27 DIAGNOSIS — M1A0710 Idiopathic chronic gout, right ankle and foot, without tophus (tophi): Secondary | ICD-10-CM

## 2022-09-27 DIAGNOSIS — R413 Other amnesia: Secondary | ICD-10-CM | POA: Diagnosis not present

## 2022-09-27 DIAGNOSIS — I129 Hypertensive chronic kidney disease with stage 1 through stage 4 chronic kidney disease, or unspecified chronic kidney disease: Secondary | ICD-10-CM | POA: Diagnosis not present

## 2022-09-27 DIAGNOSIS — F0280 Dementia in other diseases classified elsewhere without behavioral disturbance: Secondary | ICD-10-CM | POA: Insufficient documentation

## 2022-09-27 DIAGNOSIS — M810 Age-related osteoporosis without current pathological fracture: Secondary | ICD-10-CM

## 2022-09-27 DIAGNOSIS — E559 Vitamin D deficiency, unspecified: Secondary | ICD-10-CM | POA: Diagnosis not present

## 2022-09-27 DIAGNOSIS — Z23 Encounter for immunization: Secondary | ICD-10-CM

## 2022-09-27 DIAGNOSIS — E538 Deficiency of other specified B group vitamins: Secondary | ICD-10-CM | POA: Diagnosis not present

## 2022-09-27 DIAGNOSIS — N1831 Chronic kidney disease, stage 3a: Secondary | ICD-10-CM | POA: Diagnosis not present

## 2022-09-27 DIAGNOSIS — I700 Atherosclerosis of aorta: Secondary | ICD-10-CM | POA: Diagnosis not present

## 2022-09-27 DIAGNOSIS — K219 Gastro-esophageal reflux disease without esophagitis: Secondary | ICD-10-CM

## 2022-09-27 MED ORDER — NIFEDIPINE ER 60 MG PO TB24: 60.0000 mg | ORAL_TABLET | Freq: Every day | ORAL | 4 refills | Status: DC

## 2022-09-27 MED ORDER — RALOXIFENE HCL 60 MG PO TABS: 60.0000 mg | ORAL_TABLET | Freq: Every day | ORAL | 4 refills | Status: DC

## 2022-09-27 MED ORDER — ATENOLOL 100 MG PO TABS: 100.0000 mg | ORAL_TABLET | Freq: Every day | ORAL | 4 refills | Status: DC

## 2022-09-27 MED ORDER — OMEPRAZOLE 20 MG PO CPDR: 20.0000 mg | DELAYED_RELEASE_CAPSULE | Freq: Every day | ORAL | 4 refills | Status: DC

## 2022-09-27 MED ORDER — DONEPEZIL HCL 5 MG PO TABS: 5.0000 mg | ORAL_TABLET | Freq: Every day | ORAL | 4 refills | Status: DC

## 2022-09-27 NOTE — Assessment & Plan Note (Signed)
Chronic. Noted on imaging on 03/22/2018.  At this time will not initiate statin due to advanced and patient wishes.  Recommend daily Baby ASA 81 MG for prevention. 

## 2022-09-27 NOTE — Progress Notes (Signed)
BP 127/71   Pulse 62 Comment: apical  Temp 97.9 F (36.6 C) (Oral)   Ht '5\' 5"'$  (1.651 m)   Wt 163 lb 14.4 oz (74.3 kg)   LMP  (LMP Unknown)   SpO2 97%   BMI 27.27 kg/m    Subjective:    Patient ID: Cynthia Hurst, female    DOB: [birth year], 87 y.o.   MRN: [id number]  HPI: Cynthia Hurst is a 87 y.o. female  Chief Complaint  Patient presents with   Hypertension   Gastroesophageal Reflux   Gout   Osteoporosis   Memory Loss    Patient daughter says she is noticing some memory changes being forgetful and then noticing the patient is repeating herself a lot lately. Patient daughter says she is noticing it more frequently.    Flu Vaccine    Patient daughter says she wants to wait for the patient to have Flu vaccine as she will take the patient and her father together to get their shots.   Daughter present at bedside with her today.  MGUS: Saw Dr. Tasia Catchings on 03/04/22 -- no changes made.  Had renal ultrasound on 02/14/2020 with normal findings, right-sided renal cysts measuring 1.8 cm and questionable mild bladder wall thickening. Denies any B symptoms at this time.  OSTEOPENIA Previous osteopenia and recent DEXA 06/13/22 noted osteoporosis.  Has been on Evista for years.  The BMD measured at [facility]% is 0.600 g/cm2 with a T-score of -3.2.  Past osteoporosis medications/treatments:  Adequate calcium & vitamin D: yes Weight bearing exercises: yes   GERD Continues on Omeprazole daily at this time without issue.   GERD control status: stable  Satisfied with current treatment? yes Heartburn frequency: occasional Medication side effects: no  Medication compliance: stable Dysphagia: no Odynophagia:  no Hematemesis: no Blood in stool: no EGD: no  HYPERTENSION & MEMORY CHANGES Continues on Nifedipine and Atenolol.  Aortic atherosclerosis noted on imaging 03/22/2018.    Her daughter is concerned for memory changes -- as she is repeating similar questions.  Will call her  daughter and ask question, but then call back again and ask same question. Yesterday talked to her a couple times and when she called another daughter she reported she had not talked to daughter in office today.  No changes in morning routine, occasionally forgetting steps.  Has left a cake in oven.  Not forgetting steps when driving on how to get from point A to point B.  She lives with husband -- he is very dependent on her.  Denies headaches, slurred speech, gait changes, CP, SOB, facial droop. Satisfied with current treatment? yes Duration of hypertension: chronic BP monitoring frequency: monthly BP range: unsure BP medication side effects: no Aspirin: no Recent stressors: no Recurrent headaches: no Visual changes: no Palpitations: no Dyspnea: no Chest pain: no Lower extremity edema: no Dizzy/lightheaded: no     09/27/2022   10:01 AM  MMSE - Mini Mental State Exam  Orientation to time 1  Orientation to Place 5  Registration 3  Attention/ Calculation 4  Recall 0  Language- name 2 objects 2  Language- repeat 1  Language- follow 3 step command 3  Language- read & follow direction 1  Write a sentence 1  Copy design 1  Total score 22     CHRONIC KIDNEY DISEASE Continues on Allopurinol for gout history, has not had a flare in several years. CKD status: controlled Medications renally dose: yes Previous renal evaluation: no  Pneumovax: Up To Date Influenza Vaccine:  her daughter is going to take her to pharmacy  Relevant past medical, surgical, family and social history reviewed and updated as indicated. Interim medical history since our last visit reviewed. Allergies and medications reviewed and updated.  Review of Systems  Constitutional:  Negative for activity change, appetite change, diaphoresis, fatigue and fever.  Respiratory:  Negative for cough, chest tightness and shortness of breath.   Cardiovascular:  Negative for chest pain, palpitations and leg swelling.   Gastrointestinal: Negative.   Musculoskeletal:  Negative for back pain.  Neurological: Negative.   Psychiatric/Behavioral: Negative.      Per HPI unless specifically indicated above     Objective:    BP 127/71   Pulse 62 Comment: apical  Temp 97.9 F (36.6 C) (Oral)   Ht '5\' 5"'$  (1.651 m)   Wt 163 lb 14.4 oz (74.3 kg)   LMP  (LMP Unknown)   SpO2 97%   BMI 27.27 kg/m   Wt Readings from Last 3 Encounters:  09/27/22 163 lb 14.4 oz (74.3 kg)  04/18/22 159 lb 3.2 oz (72.2 kg)  03/04/22 156 lb 11.2 oz (71.1 kg)    Physical Exam Vitals and nursing note reviewed.  Constitutional:      General: She is awake. She is not in acute distress.    Appearance: She is well-developed and well-groomed. She is not ill-appearing.  HENT:     Head: Normocephalic.     Right Ear: Hearing normal.     Left Ear: Hearing normal.  Eyes:     General: Lids are normal.        Right eye: No discharge.        Left eye: No discharge.     Conjunctiva/sclera: Conjunctivae normal.     Pupils: Pupils are equal, round, and reactive to light.  Neck:     Thyroid: No thyromegaly.     Vascular: No carotid bruit.  Cardiovascular:     Rate and Rhythm: Normal rate and regular rhythm.     Heart sounds: Normal heart sounds. No murmur heard.    No gallop.  Pulmonary:     Effort: Pulmonary effort is normal. No accessory muscle usage or respiratory distress.     Breath sounds: Normal breath sounds.  Abdominal:     General: Bowel sounds are normal.     Palpations: Abdomen is soft.     Tenderness: There is no abdominal tenderness.  Musculoskeletal:     Cervical back: Normal range of motion and neck supple.     Right lower leg: No edema.     Left lower leg: No edema.     Comments: Kyphosis present.  Lymphadenopathy:     Head:     Right side of head: No submental, submandibular, tonsillar, preauricular or posterior auricular adenopathy.     Left side of head: No submental, submandibular, tonsillar, preauricular  or posterior auricular adenopathy.  Skin:    General: Skin is warm and dry.  Neurological:     Mental Status: She is alert.     Cranial Nerves: Cranial nerves 2-12 are intact.     Motor: Motor function is intact.     Coordination: Coordination is intact.     Gait: Gait is intact.     Deep Tendon Reflexes: Reflexes are normal and symmetric.     Reflex Scores:      Brachioradialis reflexes are 2+ on the right side and 2+ on the left side.  Patellar reflexes are 2+ on the right side and 2+ on the left side.    Comments: Not able to report year, month, or day.  Able to report location and state.  Psychiatric:        Attention and Perception: Attention normal.        Mood and Affect: Mood normal.        Speech: Speech normal.        Behavior: Behavior normal. Behavior is cooperative.        Thought Content: Thought content normal.     Results for orders placed or performed in visit on 04/18/22  Lipid Panel w/o Chol/HDL Ratio  Result Value Ref Range   Cholesterol, Total 191 100 - 199 mg/dL   Triglycerides 111 0 - 149 mg/dL   HDL 68 >39 mg/dL   VLDL Cholesterol Cal 20 5 - 40 mg/dL   LDL Chol Calc (NIH) 103 (H) 0 - 99 mg/dL  VITAMIN D 25 Hydroxy (Vit-D Deficiency, Fractures)  Result Value Ref Range   Vit D, 25-Hydroxy 33.2 30.0 - 100.0 ng/mL  TSH  Result Value Ref Range   TSH 2.970 0.450 - 4.500 uIU/mL  Microalbumin, Urine Waived  Result Value Ref Range   Microalb, Ur Waived 10 0 - 19 mg/L   Creatinine, Urine Waived 200 10 - 300 mg/dL   Microalb/Creat Ratio <30 <30 mg/g      Assessment & Plan:   Problem List Items Addressed This Visit       Cardiovascular and Mediastinum   Aortic atherosclerosis (HCC)    Chronic. Noted on imaging on 03/22/2018.  At this time will not initiate statin due to advanced and patient wishes.  Recommend daily Baby ASA 81 MG for prevention.      Relevant Medications   NIFEdipine (ADALAT CC) 60 MG 24 hr tablet   atenolol (TENORMIN) 100 MG  tablet   Other Relevant Orders   Comprehensive metabolic panel   Lipid Panel w/o Chol/HDL Ratio   Benign hypertension with chronic kidney disease    Chronic, stable.  BP below goal in office today for age.  Continue current medication regimen and adjust as needed -- could consider reduction of medication in future if BP remains stable.  LABS: CMP, CBC, TSH.  Renal dose medications as needed.  Recommend she continue to monitor BP at home at least a few days a week and document + focus on DASH diet.        Relevant Medications   NIFEdipine (ADALAT CC) 60 MG 24 hr tablet   atenolol (TENORMIN) 100 MG tablet   Other Relevant Orders   Comprehensive metabolic panel   Lipid Panel w/o Chol/HDL Ratio     Digestive   GERD (gastroesophageal reflux disease)    Chronic, stable.  Recommend she reduce use of this due to her age and osteopenia.  Discussed slow reduction off and then to use as needed.  Was able to verbalize back understanding. Mag level today.      Relevant Medications   omeprazole (PRILOSEC) 20 MG capsule   Other Relevant Orders   Magnesium     Musculoskeletal and Integument   Age related osteoporosis    Ongoing.  Will continue Evista at this time and plan on repeat DEXA in 2 years. No recent falls or fractures.  Continue supplements.        Relevant Medications   raloxifene (EVISTA) 60 MG tablet     Genitourinary   CKD (chronic kidney disease),  stage III (HCC) - Primary    Chronic with CKD 3a.  Monitor CMP every 6 months and refer to nephrology as needed if worsening CKD.  Renal dose medications as needed.        Relevant Orders   Comprehensive metabolic panel     Other   Gout    Chronic, stable with Allopurinol at 100 MG.  Continue current regimen and adjust as needed.   Uric acid level today.      Relevant Orders   Uric acid   Memory changes    New onset over past months.  MMSE 22/30 with short term memory changes and unable to report year/month/day.  Will place  referral to neurology and obtain MRI brain.  Check labs today to include CBC, CMP, B12, RPR, TSH.  Start Aricept at 5 MG daily, educated family and her on this.  To return in 6 weeks.      Relevant Orders   RPR   MR Brain Wo Contrast   TSH   Ambulatory referral to Neurology   MGUS (monoclonal gammopathy of unknown significance)    Chronic, ongoing.  Continue collaboration with oncology, recent note reviewed.      Relevant Orders   CBC with Differential/Platelet   Vitamin B12 deficiency    Chronic, stable.  Continue daily supplement and adjust as needed.  Obtain B12 level.      Relevant Orders   Vitamin B12   Vitamin D deficiency    Chronic, stable.  Continue daily supplement and adjust as needed.  Labs today.        Follow up plan: Return in about 6 weeks (around 11/08/2022) for Memory Changes.

## 2022-09-27 NOTE — Assessment & Plan Note (Signed)
New onset over past months.  MMSE 22/30 with short term memory changes and unable to report year/month/day.  Will place referral to neurology and obtain MRI brain.  Check labs today to include CBC, CMP, B12, RPR, TSH.  Start Aricept at 5 MG daily, educated family and her on this.  To return in 6 weeks.

## 2022-09-27 NOTE — Assessment & Plan Note (Addendum)
Chronic, stable.  BP below goal in office today for age.  Continue current medication regimen and adjust as needed -- could consider reduction of medication in future if BP remains stable.  LABS: CMP, CBC, TSH.  Renal dose medications as needed.  Recommend she continue to monitor BP at home at least a few days a week and document + focus on DASH diet.

## 2022-09-27 NOTE — Assessment & Plan Note (Signed)
Chronic, stable.  Continue daily supplement and adjust as needed.  Obtain B12 level. 

## 2022-09-27 NOTE — Assessment & Plan Note (Signed)
Chronic, stable with Allopurinol at 100 MG.  Continue current regimen and adjust as needed.   Uric acid level today. 

## 2022-09-27 NOTE — Assessment & Plan Note (Signed)
Chronic, stable.  Continue daily supplement and adjust as needed.  Labs today. 

## 2022-09-27 NOTE — Assessment & Plan Note (Signed)
Chronic, stable.  Recommend she reduce use of this due to her age and osteopenia.  Discussed slow reduction off and then to use as needed.  Was able to verbalize back understanding. Mag level today.

## 2022-09-27 NOTE — Assessment & Plan Note (Signed)
Chronic with CKD 3a.  Monitor CMP every 6 months and refer to nephrology as needed if worsening CKD.  Renal dose medications as needed.   

## 2022-09-27 NOTE — Assessment & Plan Note (Signed)
Ongoing.  Will continue Evista at this time and plan on repeat DEXA in 2 years. No recent falls or fractures.  Continue supplements.

## 2022-09-27 NOTE — Assessment & Plan Note (Signed)
Chronic, ongoing.  Continue collaboration with oncology, recent note reviewed. 

## 2022-09-28 LAB — RPR: RPR Ser Ql: NONREACTIVE

## 2022-09-28 LAB — COMPREHENSIVE METABOLIC PANEL
ALT: 6 IU/L (ref 0–32)
AST: 16 IU/L (ref 0–40)
Albumin/Globulin Ratio: 1.5 (ref 1.2–2.2)
Albumin: 4 g/dL (ref 3.7–4.7)
Alkaline Phosphatase: 79 IU/L (ref 44–121)
BUN/Creatinine Ratio: 13 (ref 12–28)
BUN: 14 mg/dL (ref 8–27)
Bilirubin Total: 0.6 mg/dL (ref 0.0–1.2)
CO2: 25 mmol/L (ref 20–29)
Calcium: 9.9 mg/dL (ref 8.7–10.3)
Chloride: 104 mmol/L (ref 96–106)
Creatinine, Ser: 1.08 mg/dL — ABNORMAL HIGH (ref 0.57–1.00)
Globulin, Total: 2.6 g/dL (ref 1.5–4.5)
Glucose: 85 mg/dL (ref 70–99)
Potassium: 4.4 mmol/L (ref 3.5–5.2)
Sodium: 142 mmol/L (ref 134–144)
Total Protein: 6.6 g/dL (ref 6.0–8.5)
eGFR: 50 mL/min/{1.73_m2} — ABNORMAL LOW (ref >59)

## 2022-09-28 LAB — CBC WITH DIFFERENTIAL/PLATELET
Basophils Absolute: 0 10*3/uL (ref 0.0–0.2)
Basos: 1 %
EOS (ABSOLUTE): 0.1 10*3/uL (ref 0.0–0.4)
Eos: 2 %
Hematocrit: 38.3 % (ref 34.0–46.6)
Hemoglobin: 12.5 g/dL (ref 11.1–15.9)
Immature Grans (Abs): 0 10*3/uL (ref 0.0–0.1)
Immature Granulocytes: 0 %
Lymphocytes Absolute: 1.6 10*3/uL (ref 0.7–3.1)
Lymphs: 28 %
MCH: 30.7 pg (ref 26.6–33.0)
MCHC: 32.6 g/dL (ref 31.5–35.7)
MCV: 94 fL (ref 79–97)
Monocytes Absolute: 0.5 10*3/uL (ref 0.1–0.9)
Monocytes: 9 %
Neutrophils Absolute: 3.5 10*3/uL (ref 1.4–7.0)
Neutrophils: 60 %
Platelets: 158 10*3/uL (ref 150–450)
RBC: 4.07 x10E6/uL (ref 3.77–5.28)
RDW: 14.6 % (ref 11.7–15.4)
WBC: 5.8 10*3/uL (ref 3.4–10.8)

## 2022-09-28 LAB — LIPID PANEL W/O CHOL/HDL RATIO
Cholesterol, Total: 178 mg/dL (ref 100–199)
HDL: 73 mg/dL (ref >39)
LDL Chol Calc (NIH): 83 mg/dL (ref 0–99)
Triglycerides: 128 mg/dL (ref 0–149)
VLDL Cholesterol Cal: 22 mg/dL (ref 5–40)

## 2022-09-28 LAB — VITAMIN B12: Vitamin B-12: >2000 pg/mL — ABNORMAL HIGH (ref 232–1245)

## 2022-09-28 LAB — URIC ACID: Uric Acid: 4.7 mg/dL (ref 3.1–7.9)

## 2022-09-28 LAB — MAGNESIUM: Magnesium: 1.9 mg/dL (ref 1.6–2.3)

## 2022-09-28 LAB — TSH: TSH: 3.11 u[IU]/mL (ref 0.450–4.500)

## 2022-09-28 NOTE — Progress Notes (Signed)
Good morning, please let Cynthia Hurst know labs have returned.  She continues to show mild kidney disease with no worsening.  B12 is well above goal, I recommend decrease taking B12 to every other day and not every day.  Remainder of labs are all stable.  Any questions? Keep being wonderful!!  Thank you for allowing me to participate in your care.  I appreciate you. Kindest regards, Emmah Bratcher

## 2022-10-04 ENCOUNTER — Ambulatory Visit
Admission: RE | Admit: 2022-10-04 | Discharge: 2022-10-04 | Disposition: A | Discharge status: Home / Self Care | Payer: Medicare Other | Source: Ambulatory Visit | Attending: Nurse Practitioner | Admitting: Nurse Practitioner

## 2022-10-04 DIAGNOSIS — R413 Other amnesia: Secondary | ICD-10-CM | POA: Diagnosis not present

## 2022-10-06 ENCOUNTER — Telehealth: Payer: Self-pay | Admitting: Nurse Practitioner

## 2022-10-06 NOTE — Telephone Encounter (Signed)
Spoke to patient's main contact, daughter Nicholes Calamity at [phone].  Discussed with her results from her mother's MRI noting volume loss in left temporal lobe and hippocampus which goes along with her recent MMSE noting changes in short term memory -- Recommend they start Aricept as instructed, they had not started yet due to concerns for side effects which provider discussed at length with her today.  They now plan on starting.  There is referral in place for neurology and recommend they schedule and attend for further recommendations.  Educated her on areas of brain affected and how this can affect memory.  All questions answered, she stated appreciation for call.

## 2022-10-11 ENCOUNTER — Ambulatory Visit: Payer: Self-pay | Admitting: *Deleted

## 2022-10-11 NOTE — Telephone Encounter (Signed)
Summary: rx review   The patient's daughter has called to speak with a member of clinical staff regarding the patient's prescription for omeprazole (PRILOSEC) 20 MG capsule [[id number]]   Lattie Haw would like confirmation that the patient is supposed to continue with the medication   Please contact further when possible       Called patient's daughter , on DPR to review medication questions. Daughter requesting to know if patient is to be taking omeprazole '20mg'$ . In review of OV 09/27/22 note  says :recommend she reduce use of this due to her age and osteopenia. Discussed slow reduction off and then to use as needed. Please verify medication to reduce is omerprazole 20 mg and how to reduce.  Daughter requesting a call back tomorrow if needed.     Reason for Disposition  [1] Caller has NON-URGENT medicine question about med that PCP prescribed AND [2] triager unable to answer question  Answer Assessment - Initial Assessment Questions 1. NAME of MEDICINE: "What medicine(s) are you calling about?"     Omeprazole(prilosec) 20 mg   2. QUESTION: "What is your question?" (e.g., double dose of medicine, side effect)     Is patient supposed to discontinue medication? Per daughter on DPR 3. PRESCRIBER: "Who prescribed the medicine?" Reason: if prescribed by specialist, call should be referred to that group.     PCP 4. SYMPTOMS: "Do you have any symptoms?" If Yes, ask: "What symptoms are you having?"  "How bad are the symptoms (e.g., mild, moderate, severe)     Na  5. PREGNANCY:  "Is there any chance that you are pregnant?" "When was your last menstrual period?"     na  Protocols used: Medication Question Call-A-AH

## 2022-10-12 NOTE — Telephone Encounter (Signed)
Spoke with patient daughter Lattie Haw and made her aware of [name]'s recommendations. Lattie Haw verbalized understanding.

## 2022-10-16 NOTE — Patient Instructions (Signed)

## 2022-10-18 ENCOUNTER — Other Ambulatory Visit: Payer: Self-pay | Admitting: Nurse Practitioner

## 2022-10-18 NOTE — Telephone Encounter (Signed)
Future visit tomorrow .  Requested Prescriptions  Pending Prescriptions Disp Refills   KLOR-CON M20 20 MEQ tablet [Pharmacy Med Name: POTASSIUM CHLORIDE ER (DISP) TABS 20MEQ] 90 tablet 0    Sig: TAKE 1 TABLET DAILY     Endocrinology:  Minerals - Potassium Supplementation Failed - 10/18/2022 12:59 AM      Failed - Cr in normal range and within 360 days    Creatinine, Ser  Date Value Ref Range Status  09/27/2022 1.08 (H) 0.57 - 1.00 mg/dL Final         Passed - K in normal range and within 360 days    Potassium  Date Value Ref Range Status  09/27/2022 4.4 3.5 - 5.2 mmol/L Final         Passed - Valid encounter within last 12 months    Recent Outpatient Visits           3 weeks ago Stage 3a chronic kidney disease ([place])   [place], [name] T, NP   6 months ago Stage 3a chronic kidney disease ([place])   [place], [name] T, NP   1 year ago Stage 3a chronic kidney disease ([place])   [place], [name] T, NP   1 year ago Aortic atherosclerosis ([place])   [place], [name] T, NP   2 years ago Stage 3a chronic kidney disease ([place])   [place], Barbaraann Faster, NP       Future Appointments             Tomorrow [name], Barbaraann Faster, NP [place], [place]   In 3 weeks [name], Barbaraann Faster, NP [employer], [facility]

## 2022-10-19 ENCOUNTER — Encounter: Payer: Self-pay | Admitting: Nurse Practitioner

## 2022-10-19 ENCOUNTER — Ambulatory Visit (INDEPENDENT_AMBULATORY_CARE_PROVIDER_SITE_OTHER): Payer: Medicare Other | Admitting: Nurse Practitioner

## 2022-10-19 VITALS — BP 100/61 | HR 53 | Temp 97.9°F | Ht 65.0 in | Wt 163.6 lb

## 2022-10-19 DIAGNOSIS — M419 Scoliosis, unspecified: Secondary | ICD-10-CM

## 2022-10-19 DIAGNOSIS — R413 Other amnesia: Secondary | ICD-10-CM

## 2022-10-19 MED ORDER — DONEPEZIL HCL 10 MG PO TABS: 10.0000 mg | ORAL_TABLET | Freq: Every day | ORAL | 4 refills | Status: DC

## 2022-10-19 NOTE — Assessment & Plan Note (Signed)
Chronic, with some back pain last week, now improved.  Recommend continue Tylenol and heat as needed + rest.  If worsening consider PT in home, as PT offered her benefit in past.

## 2022-10-19 NOTE — Assessment & Plan Note (Signed)
New diagnosis, possible Alzheimer's on imaging.  She is tolerating Aricept with some improvement in memory noted by family.  Will increase this to 10 MG, discussed with her daughter and her.  She is scheduled to see neurology on 11/07/22, appreciated their input.  Offered support and education today.

## 2022-10-19 NOTE — Progress Notes (Signed)
BP 100/61   Pulse (!) 53   Temp 97.9 F (36.6 C) (Oral)   Ht _0  (1.651 m)   Wt 163 lb 9.6 oz (74.2 kg)   LMP  (LMP Unknown)   SpO2 95%   BMI 27.22 kg/m    Subjective:    Patient ID: Cynthia Hurst, female    DOB: 1935-09-25, 87 y.o.   MRN: [id number]  HPI: Cynthia Hurst is a 87 y.o. female  Chief Complaint  Patient presents with   Memory Changes    Patient is here for a follow up on Memory Changes. Patient denies having any concerns at today's visit.    Back Pain    Patient daughter says patient has complained of lower back pain. Patient daughter says she noticed patient was moving around a little slower and says she asked and patient says that she did not feel her best and the pain was all around her back.    Daughter present at bedside with her today.  MEMORY CHANGES Started Aricept at last visit and is tolerating.  Her daughter noticed she is not calling her as much and asking same questions.  No changes in morning routine, occasionally forgetting steps.  Has left a cake in oven in past.  Not forgetting steps when driving on how to get from point A to point B.  She lives with husband -- he is very dependent on her.  Denies headaches, slurred speech, gait changes, CP, SOB, facial droop.  Her daughter reports some family history in parents.   Satisfied with current treatment? yes Duration of hypertension: chronic BP monitoring frequency: monthly BP range: unsure BP medication side effects: no Aspirin: no Recent stressors: no Recurrent headaches: no Visual changes: no Palpitations: no Dyspnea: no Chest pain: no Lower extremity edema: no Dizzy/lightheaded: no     09/27/2022   10:01 AM  MMSE - Mini Mental State Exam  Orientation to time 1  Orientation to Place 5  Registration 3  Attention/ Calculation 4  Recall 0  Language- name 2 objects 2  Language- repeat 1  Language- follow 3 step command 3  Language- read & follow direction 1  Write a sentence 1   Copy design 1  Total score 22     BACK PAIN Had some back pain last week, has underlying osteopenia and scoliosis.  Has been to physical therapy. Reports this is improved at this time. Duration: days Mechanism of injury: unknown Location: midline Onset: gradual Severity: 3/10 Quality: dull and aching Frequency: intermittent Radiation: none Aggravating factors: lifting and movement Alleviating factors: rest Status: fluctuating Treatments attempted: rest  Relief with NSAIDs?: No NSAIDs Taken Nighttime pain:  no Paresthesias / decreased sensation:  no Bowel / bladder incontinence:  no Fevers:  no Dysuria / urinary frequency:  no   Relevant past medical, surgical, family and social history reviewed and updated as indicated. Interim medical history since our last visit reviewed. Allergies and medications reviewed and updated.  Review of Systems  Constitutional:  Negative for activity change, appetite change, diaphoresis, fatigue and fever.  Respiratory:  Negative for cough, chest tightness and shortness of breath.   Cardiovascular:  Negative for chest pain, palpitations and leg swelling.  Gastrointestinal: Negative.   Musculoskeletal:  Negative for back pain.  Neurological: Negative.   Psychiatric/Behavioral: Negative.     Per HPI unless specifically indicated above     Objective:    BP 100/61   Pulse (!) 53   Temp 97.9  F (36.6 C) (Oral)   Ht _0  (1.651 m)   Wt 163 lb 9.6 oz (74.2 kg)   LMP  (LMP Unknown)   SpO2 95%   BMI 27.22 kg/m   Wt Readings from Last 3 Encounters:  10/19/22 163 lb 9.6 oz (74.2 kg)  09/27/22 163 lb 14.4 oz (74.3 kg)  04/18/22 159 lb 3.2 oz (72.2 kg)    Physical Exam Vitals and nursing note reviewed.  Constitutional:      General: She is awake. She is not in acute distress.    Appearance: She is well-developed and well-groomed. She is not ill-appearing.  HENT:     Head: Normocephalic.     Right Ear: Hearing normal.     Left Ear:  Hearing normal.  Eyes:     General: Lids are normal.        Right eye: No discharge.        Left eye: No discharge.     Conjunctiva/sclera: Conjunctivae normal.     Pupils: Pupils are equal, round, and reactive to light.  Neck:     Thyroid: No thyromegaly.     Vascular: No carotid bruit.  Cardiovascular:     Rate and Rhythm: Normal rate and regular rhythm.     Heart sounds: Normal heart sounds. No murmur heard.    No gallop.  Pulmonary:     Effort: Pulmonary effort is normal. No accessory muscle usage or respiratory distress.     Breath sounds: Normal breath sounds.  Abdominal:     General: Bowel sounds are normal.     Palpations: Abdomen is soft.     Tenderness: There is no abdominal tenderness.  Musculoskeletal:     Cervical back: Normal range of motion and neck supple.     Right lower leg: No edema.     Left lower leg: No edema.     Comments: Kyphosis present.  Lymphadenopathy:     Head:     Right side of head: No submental, submandibular, tonsillar, preauricular or posterior auricular adenopathy.     Left side of head: No submental, submandibular, tonsillar, preauricular or posterior auricular adenopathy.  Skin:    General: Skin is warm and dry.  Neurological:     Mental Status: She is alert.     Cranial Nerves: Cranial nerves 2-12 are intact.     Motor: Motor function is intact.     Coordination: Coordination is intact.     Gait: Gait is intact.     Deep Tendon Reflexes: Reflexes are normal and symmetric.     Reflex Scores:      Brachioradialis reflexes are 2+ on the right side and 2+ on the left side.      Patellar reflexes are 2+ on the right side and 2+ on the left side.    Comments: Not able to report year, month, or day.  Able to report location and state.  Psychiatric:        Attention and Perception: Attention normal.        Mood and Affect: Mood normal.        Speech: Speech normal.        Behavior: Behavior normal. Behavior is cooperative.        Thought  Content: Thought content normal.     Results for orders placed or performed in visit on 09/27/22  CBC with Differential/Platelet  Result Value Ref Range   WBC 5.8 3.4 - 10.8 x10E3/uL   RBC 4.07 3.77 -  5.28 x10E6/uL   Hemoglobin 12.5 11.1 - 15.9 g/dL   Hematocrit 38.3 34.0 - 46.6 %   MCV 94 79 - 97 fL   MCH 30.7 26.6 - 33.0 pg   MCHC 32.6 31.5 - 35.7 g/dL   RDW 14.6 11.7 - 15.4 %   Platelets 158 150 - 450 x10E3/uL   Neutrophils 60 Not Estab. %   Lymphs 28 Not Estab. %   Monocytes 9 Not Estab. %   Eos 2 Not Estab. %   Basos 1 Not Estab. %   Neutrophils Absolute 3.5 1.4 - 7.0 x10E3/uL   Lymphocytes Absolute 1.6 0.7 - 3.1 x10E3/uL   Monocytes Absolute 0.5 0.1 - 0.9 x10E3/uL   EOS (ABSOLUTE) 0.1 0.0 - 0.4 x10E3/uL   Basophils Absolute 0.0 0.0 - 0.2 x10E3/uL   Immature Granulocytes 0 Not Estab. %   Immature Grans (Abs) 0.0 0.0 - 0.1 x10E3/uL  Comprehensive metabolic panel  Result Value Ref Range   Glucose 85 70 - 99 mg/dL   BUN 14 8 - 27 mg/dL   Creatinine, Ser 1.08 (H) 0.57 - 1.00 mg/dL   eGFR 50 (L) >59 mL/min/1.73   BUN/Creatinine Ratio 13 12 - 28   Sodium 142 134 - 144 mmol/L   Potassium 4.4 3.5 - 5.2 mmol/L   Chloride 104 96 - 106 mmol/L   CO2 25 20 - 29 mmol/L   Calcium 9.9 8.7 - 10.3 mg/dL   Total Protein 6.6 6.0 - 8.5 g/dL   Albumin 4.0 3.7 - 4.7 g/dL   Globulin, Total 2.6 1.5 - 4.5 g/dL   Albumin/Globulin Ratio 1.5 1.2 - 2.2   Bilirubin Total 0.6 0.0 - 1.2 mg/dL   Alkaline Phosphatase 79 44 - 121 IU/L   AST 16 0 - 40 IU/L   ALT 6 0 - 32 IU/L  Lipid Panel w/o Chol/HDL Ratio  Result Value Ref Range   Cholesterol, Total 178 100 - 199 mg/dL   Triglycerides 128 0 - 149 mg/dL   HDL 73 >39 mg/dL   VLDL Cholesterol Cal 22 5 - 40 mg/dL   LDL Chol Calc (NIH) 83 0 - 99 mg/dL  Uric acid  Result Value Ref Range   Uric Acid 4.7 3.1 - 7.9 mg/dL  Magnesium  Result Value Ref Range   Magnesium 1.9 1.6 - 2.3 mg/dL  Vitamin B12  Result Value Ref Range   Vitamin B-12  >2000 (H) 232 - 1245 pg/mL  RPR  Result Value Ref Range   RPR Ser Ql Non Reactive Non Reactive  TSH  Result Value Ref Range   TSH 3.110 0.450 - 4.500 uIU/mL      Assessment & Plan:   Problem List Items Addressed This Visit       Musculoskeletal and Integument   Scoliosis    Chronic, with some back pain last week, now improved.  Recommend continue Tylenol and heat as needed + rest.  If worsening consider PT in home, as PT offered her benefit in past.        Other   Memory changes - Primary (Chronic)    New diagnosis, possible Alzheimer's on imaging.  She is tolerating Aricept with some improvement in memory noted by family.  Will increase this to 10 MG, discussed with her daughter and her.  She is scheduled to see neurology on 11/07/22, appreciated their input.  Offered support and education today.        Follow up plan: Return in about 3 months (around 01/19/2023)  for DEMENTIA, CKD, OSTEOPENIA.

## 2022-11-07 ENCOUNTER — Ambulatory Visit (INDEPENDENT_AMBULATORY_CARE_PROVIDER_SITE_OTHER): Payer: Medicare Other | Admitting: Psychiatry

## 2022-11-07 ENCOUNTER — Encounter: Payer: Self-pay | Admitting: Psychiatry

## 2022-11-07 VITALS — BP 122/64 | HR 66 | Ht 62.0 in | Wt 162.0 lb

## 2022-11-07 DIAGNOSIS — G309 Alzheimer's disease, unspecified: Secondary | ICD-10-CM | POA: Diagnosis not present

## 2022-11-07 DIAGNOSIS — F0280 Dementia in other diseases classified elsewhere without behavioral disturbance: Secondary | ICD-10-CM | POA: Diagnosis not present

## 2022-11-07 NOTE — Progress Notes (Signed)
[facility]  PATIENT: Cynthia Hurst DOB: March 03, 1935  REFERRING CLINICIAN: Venita Lick, NP HISTORY FROM: self, daughter Lattie Haw REASON FOR VISIT: memory loss   HISTORICAL  CHIEF COMPLAINT:  Chief Complaint  Patient presents with   Memory Loss    Rm 1 with daughter lisa  Pt is well, daughter states she has had some memory changes at least since April. Forgetting how to do daily activities, repetitive questions and general conversation     HISTORY OF PRESENT ILLNESS:  The patient presents for evaluation of memory loss which has been present over the past year, but has been more noticeable since April 2023. Daughter states she has become progressively more forgetful, and has been repeating questions and conversations. Will call multiple times to ask the same questions. She does not always remember if she ate or what she ate that day. She does misplace objects occasionally.   She was started on donepezil by her PCP. Dose was increased to 10 mg earlier this month, but she is still currently taking just 5 mg daily. She plans to pick up the 10 mg dose later today. She is tolerating the medication well, but is not sure if it has made a difference in her memory.   MRI brain 10/04/22 showed volume loss most predominant in left medial temporal lobe and hippocampus as well as moderate-severe chronic small vessel ischemic disease.  TBI:  No past history of TBI Stroke:  no past history of stroke Seizures:  no past history of seizures Sleep:  no history of sleep apnea.  Sleeps well at night Mood:  patient denies significant anxiety and depression  Functional status:  Patient lives with her husband Cooking: rarely has forgotten a cake in the oven and has been using timers to keep track Shopping: Family does most of the grocery shopping Driving: Still drives short distances (up to 2 miles) without issues. Family will drive longer distances Bills: Children help with the  finances Medications: Daughter has started to help manage her medications. They are not concerned about her ability to manage these, they just don't want her to feel overwhelmed as she is taking care of her husbands medications Ever left the stove on by accident?:  yes Forget how to use items around the house?: no Getting lost going to familiar places?: no  OTHER MEDICAL CONDITIONS: HTN, CKD3, MGUS   REVIEW OF SYSTEMS: Full 14 system review of systems performed and negative with exception of: memory loss  ALLERGIES: No Known Allergies  HOME MEDICATIONS: Outpatient Medications Prior to Visit  Medication Sig Dispense Refill   allopurinol (ZYLOPRIM) 100 MG tablet Take 1 tablet (100 mg total) by mouth daily. 90 tablet 4   atenolol (TENORMIN) 100 MG tablet Take 1 tablet (100 mg total) by mouth daily. 90 tablet 4   cholecalciferol (VITAMIN D) 1000 UNITS tablet Take 1,000 Units by mouth daily.     cyanocobalamin 2000 MCG tablet Take 2,000 mcg by mouth daily.     Diclofenac Sodium 3 % GEL Apply 1 application topically 2 (two) times daily as needed (for back pain). 100 g 0   donepezil (ARICEPT) 10 MG tablet Take 1 tablet (10 mg total) by mouth at bedtime. 90 tablet 4   NIFEdipine (ADALAT CC) 60 MG 24 hr tablet Take 1 tablet (60 mg total) by mouth daily. 90 tablet 4   potassium chloride SA (KLOR-CON M20) 20 MEQ tablet TAKE 1 TABLET DAILY 90 tablet 0   raloxifene (EVISTA) 60  MG tablet Take 1 tablet (60 mg total) by mouth daily. 90 tablet 4   omeprazole (PRILOSEC) 20 MG capsule Take 1 capsule (20 mg total) by mouth daily. (Patient not taking: Reported on 11/07/2022) 90 capsule 4   No facility-administered medications prior to visit.    PAST MEDICAL HISTORY: Past Medical History:  Diagnosis Date   Chronic kidney disease    GERD (gastroesophageal reflux disease)    Gout    Hypertension    Hypopotassemia    Osteoarthritis    Osteoporosis    LUMBAR SPINE   Vitamin B 12 deficiency     Vitamin D deficiency     PAST SURGICAL HISTORY: Past Surgical History:  Procedure Laterality Date   ABDOMINAL HYSTERECTOMY     PARTIAL   EYE SURGERY Left 05/2018   cataract surgery    JOINT REPLACEMENT Bilateral 2010,2011   KNEE    FAMILY HISTORY: Family History  Problem Relation Age of Onset   Heart disease Mother    Cancer Father    Alzheimer's disease Father    Breast cancer Neg Hx     SOCIAL HISTORY: Social History   Socioeconomic History   Marital status: Married    Spouse name: Not on file   Number of children: Not on file   Years of education: Not on file   Highest education level: High school graduate  Occupational History   Not on file  Tobacco Use   Smoking status: Never   Smokeless tobacco: Never  Vaping Use   Vaping Use: Never used  Substance and Sexual Activity   Alcohol use: No   Drug use: No   Sexual activity: Yes  Other Topics Concern   Not on file  Social History Narrative   Not on file   Social Determinants of Health   Financial Resource Strain: Low Risk  (08/04/2022)   Overall Financial Resource Strain (CARDIA)    Difficulty of Paying Living Expenses: Not hard at all  Food Insecurity: No Food Insecurity (08/04/2022)   Hunger Vital Sign    Worried About Running Out of Food in the Last Year: Never true    Ran Out of Food in the Last Year: Never true  Transportation Needs: No Transportation Needs (08/04/2022)   PRAPARE - Hydrologist (Medical): No    Lack of Transportation (Non-Medical): No  Physical Activity: Inactive (08/04/2022)   Exercise Vital Sign    Days of Exercise per Week: 0 days    Minutes of Exercise per Session: 0 min  Stress: No Stress Concern Present (08/04/2022)   [place]    Feeling of Stress : Not at all  Social Connections: Moderately Integrated (08/04/2022)   Social Connection and Isolation Panel [NHANES]    Frequency of  Communication with Friends and Family: More than three times a week    Frequency of Social Gatherings with Friends and Family: More than three times a week    Attends Religious Services: More than 4 times per year    Active Member of [employer] or Organizations: No    Attends Archivist Meetings: Never    Marital Status: Married  Human resources officer Violence: Not At Risk (08/04/2022)   Humiliation, Afraid, Rape, and Kick questionnaire    Fear of Current or Ex-Partner: No    Emotionally Abused: No    Physically Abused: No    Sexually Abused: No     PHYSICAL EXAM  GENERAL EXAM/CONSTITUTIONAL: Vitals:  Vitals:   11/07/22 1111  BP: 122/64  Pulse: 66  Weight: 162 lb (73.5 kg)  Height: '5\' 2"'$  (1.575 m)   Body mass index is 29.63 kg/m. Wt Readings from Last 3 Encounters:  11/07/22 162 lb (73.5 kg)  10/19/22 163 lb 9.6 oz (74.2 kg)  09/27/22 163 lb 14.4 oz (74.3 kg)   NEUROLOGIC: MENTAL STATUS:      11/07/2022   11:16 AM  Montreal Cognitive Assessment   Visuospatial/ Executive (0/5) 2  Naming (0/3) 0  Attention: Read list of digits (0/2) 2  Attention: Read list of letters (0/1) 1  Attention: Serial 7 subtraction starting at 100 (0/3) 0  Language: Repeat phrase (0/2) 2  Language : Fluency (0/1) 1  Abstraction (0/2) 1  Delayed Recall (0/5) 0  Orientation (0/6) 3  Total 12     CRANIAL NERVE:  2nd, 3rd, 4th, 6th - pupils equal and reactive to light, visual fields full to confrontation, extraocular muscles intact, no nystagmus 5th - facial sensation symmetric 7th - facial strength symmetric 8th - hearing intact 9th - palate elevates symmetrically, uvula midline 11th - shoulder shrug symmetric 12th - tongue protrusion midline  MOTOR:  normal bulk and tone, no cogwheeling, full strength in the BUE, BLE  SENSORY:  normal and symmetric to light touch all 4 extremities  COORDINATION:  finger-nose-finger, fine finger movements normal, no tremor  REFLEXES:  deep  tendon reflexes present and symmetric  GAIT/STATION:  normal     DIAGNOSTIC DATA (LABS, IMAGING, TESTING) - I reviewed patient records, labs, notes, testing and imaging myself where available.  Lab Results  Component Value Date   WBC 5.8 09/27/2022   HGB 12.5 09/27/2022   HCT 38.3 09/27/2022   MCV 94 09/27/2022   PLT 158 09/27/2022      Component Value Date/Time   NA 142 09/27/2022 1030   K 4.4 09/27/2022 1030   CL 104 09/27/2022 1030   CO2 25 09/27/2022 1030   GLUCOSE 85 09/27/2022 1030   GLUCOSE 96 02/25/2022 1110   BUN 14 09/27/2022 1030   CREATININE 1.08 (H) 09/27/2022 1030   CALCIUM 9.9 09/27/2022 1030   PROT 6.6 09/27/2022 1030   ALBUMIN 4.0 09/27/2022 1030   AST 16 09/27/2022 1030   ALT 6 09/27/2022 1030   ALKPHOS 79 09/27/2022 1030   BILITOT 0.6 09/27/2022 1030   GFRNONAA 59 (L) 02/25/2022 1110   GFRAA 50 (L) 08/12/2020 1309   Lab Results  Component Value Date   CHOL 178 09/27/2022   HDL 73 09/27/2022   LDLCALC 83 09/27/2022   TRIG 128 09/27/2022   CHOLHDL 2.5 09/05/2018   Lab Results  Component Value Date   HGBA1C 5.5 04/14/2021   Lab Results  Component Value Date   VITAMINB12 >2000 (H) 09/27/2022   Lab Results  Component Value Date   TSH 3.110 09/27/2022    ASSESSMENT AND PLAN  87 y.o. year old female with a history of HTN, CKD3, MGUS who presents for evaluation of memory loss. MOCA score today is 12/20. MRI brain with atrophy most prominent in the left medial temporal lobe and hippocampus as well as moderate-severe chronic small vessel ischemic disease. Clinical picture is most consistent with Alzheimer's dementia, though she may also have a component of vascular dementia in the setting of moderate-severe microvascular changes. Will defer further medications for now as she has not yet increased her dose of donepezil to 10 mg daily. Discussed adding Namenda as a  next step if she does not notice improvement with the higher dose of donepezil.  Discussed driving safety including limiting driving to short distances and familiar places. Dementia patient information provided.   1. Alzheimer disease ([place])       PLAN: - Increase donepezil to 10 mg daily  - Limit driving to short distances and familiar places - Dementia patient information provided - Next steps: consider adding Namenda   Return in about 6 months (around 05/08/2023).  I spent an average of 61 minutes chart reviewing and counseling the patient, with at least 50% of the time face to face with the patient. Reviewed safety measures including driving safety.   Genia Harold, MD 11/07/22 11:59 AM  [facility] [address] [place], [place] [address] [phone]

## 2022-11-07 NOTE — Patient Instructions (Addendum)
Consider adding memantine (namenda) if no improvement with higher dose of donepezil   DEMENTIA OVERVIEW "Dementia" is a general term for when a person has developed difficulties with reasoning, judgment, and memory. People who have dementia usually have some memory loss as well as difficulty in at least one other area, such as: ?Speaking or writing coherently (or understanding what is said or written) ?Recognizing familiar surroundings ?Planning and carrying out complex or multi-step tasks In order to be considered dementia these issues must be severe enough to interfere with a person's independence and daily activities. Dementia can be caused by several diseases that affect the brain. The most common cause is Alzheimer disease. Alzheimer disease is present in approximately 64 to 20 percent of all cases of dementia; other degenerative and/or vascular diseases may be present as well, particularly as a person gets older.   DEMENTIA RISK FACTORS There is no way to predict with certainty who will develop dementia. Each form of dementia has its own risk factors, but most forms have several risk factors in common. Age -- The biggest risk factor for dementia is age: dementia is rare in people younger than 52 years and becomes very common in people older than 64. For example, dementia affects approximately one in six people between 62 and 10 years old, one in three above 67 years, and almost half of people over age 52.  Family history -- Some forms of dementia have a genetic component, meaning that they tend to run in families. Having a close family member with Alzheimer disease increases your chances of developing it. People with a first-degree relative (parent or sibling) with Alzheimer disease have a greater chance of developing the disorder. The risk is probably highest if the family member developed Alzheimer disease at a younger age (less than 53 years old) and is lower if the family member did not get  Alzheimer disease until late in life. However, families that have a very strong genetic tendency toward Alzheimer disease are uncommon.  Other factors -- Studies indicate that high blood pressure, smoking, and diabetes may be risk factors for dementia. Experts are still not sure how treatment for these problems might influence your risk of developing dementia beyond their benefit of reducing stroke risk. Lifestyle factors have also been implicated in dementia. For instance, people who remain physically active, socially connected, and mentally engaged seem less likely to develop dementia than people who do not. These activities may produce more cognitive (mental) reserve or resilience, delaying the emergence of symptoms until an older age.  DEMENTIA SYMPTOMS Each form of dementia can cause difficulty with memory, language, reasoning, and judgment, but the symptoms are often very different from person to person. Symptoms also change over time. The differences between one form of dementia and another may only be recognizable to skilled health care providers who have experience working with people with dementia. Sometimes family members notice changes but mistakenly attribute them to aging.  Is memory loss normal? -- Many people worry that memory problems are related to early Alzheimer disease. However, some problems are normal and just related to aging, and do not signify a progressive dementia. Normal age-related changes often cause minor difficulties with immediate memory, for example, remembering a phone number or a set of directions for a short time. Temporary difficulty recalling proper names, even very familiar ones, is also common with aging. As people age normally, it is common to complain of less efficient and slower processing and learning of new information. Memory  changes due to normal aging are usually mild and do not worsen greatly over time, nor should they interfere with a person's day-to-day  functioning.  Early changes -- The earliest symptoms of Alzheimer disease are gradual and often subtle. Many people and their families first notice difficulty recalling recent events or information. This often emerges as a tendency to repeat stories or questions or to request or require repetition of material to be able to remember. If you find yourself telling an older family member or friend "I told you that earlier" or "You have told me that more than once," you might begin to suspect Alzheimer disease. Other changes can include one or more of the following: ?Difficulties with language (eg, not being able to find the right words for things) ?Difficulty with concentration and reasoning ?Problems with complex tasks like paying bills, cooking, or balancing a checkbook ?Getting lost in a familiar place  Late changes -- As Alzheimer disease progresses, a person's ability to think clearly continues to decline, and any or all of the changes listed above may be more disruptive. In addition, personality and behavioral symptoms can become quite troublesome. These can include: ?Increased anger or hostility, sometimes aggressive behavior; alternatively, some people become depressed or exhibit little interest in their surroundings (called "apathy") ?Sleep problems ?Hallucinations and/or delusions ?Disorientation ?Needing help with basic tasks (such as eating, bathing, and dressing) ?Incontinence (difficulty controlling the bladder and/or bowels)  The number of symptoms, the functions that are impaired, and the speed with which symptoms progress can vary widely from one person to the next. In some people, severe dementia occurs within five years of the diagnosis; for others, the progression can take more than 10 years. Most people with Alzheimer disease do not die from the disease itself, but rather from a secondary illness such as pneumonia, bladder infection, or complications of a fall.  DEMENTIA  DIAGNOSIS To diagnose dementia and identify the type of dementia, health care providers typically rely on the information they can gather by interacting with the person and speaking with their family members. The provider will typically perform memory and other cognitive (thinking) tests to assess the person's degree of difficulty with different types of problems. The results of these tests can then be monitored over time to observe whether functioning stays the same or declines.  Blood tests are usually done to find out if a chemical or hormonal imbalance or vitamin deficiency is contributing to the person's difficulties. Brain scans (usually MRI) are often performed in people with dementia to look for other problems. Sometimes the MRI can also help health care providers identify the type of dementia, since different types can have characteristic brain changes.  SAFETY AND LIFESTYLE ISSUES FOR PEOPLE WITH DEMENTIA A major issue for caregivers is making sure the person with dementia stays safe. Because many people with dementia do not realize that their mental functioning is impaired, they try to continue their day-to-day activities as usual. This can lead to physical danger, and caregivers must help to avoid situations that can threaten the safety of the person or others. The following information applies specifically to people with Alzheimer disease, but much of it is also relevant to people with other forms of dementia.  Medications -- People with Alzheimer disease often have trouble remembering to take medications they are prescribed for other conditions, or they become confused about which medications to take. They are also at increased risk for potentially dangerous side effects from certain medications. Sedatives and certain other  drugs (such as some antihistamines and antidepressants) may carry risk of increasing cognitive impairment. It is important to develop a plan for medication monitoring and  safety. People with dementia often need help taking their medications. It's a good idea to throw away old pill bottles and other medications that are no longer needed.  Driving -- Driving is often one of the first safety issues that arises in people with Alzheimer disease. In people with Alzheimer disease, the risk of having a car accident is significantly increased, especially as the disease progresses. It is best to discuss the issue of driving early, before the symptoms become advanced. Over time, everyone living long enough with dementia will reach a point where driving is too dangerous. Losing the ability to drive can be hard to accept because it represents independence for many people. It can also be challenging if the person does not completely appreciate their impairments in mental functioning or reaction time. In particular, driving at night may carry extra risk. Many people with mild but worrisome impairments will insist that they can safely drive locally or in the daytime. That may be true for the present time; however, people may forget that they have agreed to limitations. In addition, their ability to drive safely, even with restrictions, will deteriorate over time. A roadside driving test is often recommended if there is disagreement or uncertainty about a person's ability to drive. However, if a person with newly diagnosed, mild Alzheimer disease is deemed still able to drive, they will need to be reassessed every six months, with the understanding that driving will eventually no longer be possible. There may be important insurance implications of continued driving when medical records document advice to stop driving.  Cooking -- Cooking is another area that can lead to serious safety concerns and may require help or supervision. Symptoms such as distractibility, forgetfulness, and difficulty following directions can lead to burns, fires, or other injuries. The use of gas cooking appliance  raises a particular concern. A family member may have to ask the utility company to disconnect gas stoves if there is potential for accident or injury. Newer induction electric stoves do not change color when on, and may carry an inadvertent burn risk if a person forgets what they are doing.  Wandering -- As dementia progresses, some people with Alzheimer disease begin to wander. Because restlessness, distractibility, and memory problems are common, a person who wanders may easily become lost. Identification bracelets can help ensure that a lost wanderer gets home. The [employer] provides a "Wandering Support" program that provides ID tags and 24-hour assistance to patients who are preregistered for this program. There are many "locator" applications that allow the person with dementia to wear or carry a GPS device that a family member can track with their cell phone. Regular exercise may decrease the restlessness that can lead to wandering. Exercise is also just good practice to maintain strength, good sleep, and overall health. If wandering continues, wearable alarm systems are available that alert caretakers when the person leaves the home.  Falls -- Falls with secondary injuries are one of the most important causes of additional disability in people with all types of dementia, including Alzheimer disease. Commonly used medications can increase risk of falls and injuries. Hip fracture is a particular concern in older people, as it can lead to serious complications and sometimes even death. To reduce the risk of falls, potential tripping hazards such as loose electrical cords, slippery rugs, and clutter should be  removed. Inadvertent hoarding may develop and pose a safety risk. If the person lives alone, family members or elder services should perform safety inspections of the living space periodically. Medication lists should also be reviewed with your doctor to identify those that might  increase the risk of falling. Regular exercise, especially early in the course of dementia, and use of assistive devices like canes can also help with balance.  DEMENTIA TREATMENT Medications for Alzheimer disease -- Many people with Alzheimer disease will have the option of trying a medication. A trial of medication is usually begun for a period of a few to several weeks while the person is monitored for side effects and response. A health care provider should periodically review all medications to see if they are providing any benefit. It is important to have realistic expectations about the potential benefits of medication therapy in Alzheimer disease. None of these medications cure the disease, and the reality is that over time the person will continue to worsen. When medication does have an effect, the goal is not to stop progression of the disease, but to improve quality of life for the person and their family to the extent possible.  Treatment to slow or delay progression -- There is currently no cure for Alzheimer disease. However, experts are studying treatments in the hope of finding a way to slow the progression of the mental and functional decline, along with scientific efforts to prevent or delay onset.  Treatment of memory problems -- There are several medicines currently available for treating the memory problems associated with Alzheimer disease; they are also used in people with other forms of dementia.  ?Donepezil (brand name: Aricept) This medications allow more of a chemical called acetylcholine to be active in the brain, making up for drops in acetylcholine levels that happen in Alzheimer disease. It can cause side effects such as nausea, vomiting, and diarrhea in some people. It also may cause weight loss in many people. When taken at bedtime, cholinesterase inhibitors can cause very vivid dreams.  Treatment of behavioral symptoms -- The behavioral symptoms of Alzheimer disease are  often more troubling than the cognitive (mental) symptoms. Even in mild cases, agitation, anxiety, and irritability can occur, and generally worsen as Alzheimer disease advances. This can be stressful for the person as well as for their family and caregivers. A combination of medications and behavioral therapy may be helpful. Non-medication therapies are preferred, as virtually all medications used for behavioral symptoms can increase confusion and many are associated with serious side effects and even an increased risk of death.  Depression -- Depression is common, especially in the early phases of dementia. It may be treated with behavioral therapy and/or with medications. The key is to recognize that depression may be playing a role in the person's symptoms. If depression is causing distress, it is worth treating. Potentially helpful medicines include a group of medicines known as selective serotonin reuptake inhibitors, or SSRIs, which are usually preferred over other choices in patients with dementia. Widely used SSRIs include fluoxetine (brand name: Prozac), sertraline (brand name: Zoloft), paroxetine (sample brand names: Brisdelle, Paxil), citalopram (brand name: Celexa), and escitalopram (brand names: Lexapro, Cipralex).   A variety of behavioral therapies are often helpful, do not have the side effects often seen with medications, and may be recommended for depression. Behavioral therapy involves changing the person's environment (eg, regular exercise, avoiding triggers that cause sadness, socializing with others, engaging in pleasant activities that a person enjoys).  Agitation and  aggression -- One of the most difficult issues for caregivers and people with Alzheimer disease is aggressive behavior. Fortunately, this behavior is not common. However, many family members are reluctant to report aggressive behavior. In some cases, the behavior becomes physically abusive as dementia  progresses. Agitation and aggression can be caused by a number of factors, including: ?Confusion, misunderstanding, or disorientation (doctors use the term "delirium" as a general term to describe a state of confusion in which a person does not think or behave normally) ?Frightening or paranoid delusions or hallucinations ?Depression or anxiety ?Sleep disorders, such as reduced sleep or altered sleep/wake cycles ?Certain medical conditions that can cause delirium, such as urinary tract infection or pneumonia ?Being in physical pain or discomfort ?Side effects of certain medications Delusions (ie, believing something that is not real or true) are common in patients with dementia, occurring in up to 30 percent of those with advanced disease. Paranoid delusions are particularly distressing to both the patients and the caregivers: these often include beliefs that someone has invaded the house, that family members have been replaced by impostors, that spouses have been unfaithful, or that personal possessions have been stolen.   Family members should discuss any concerns about aggressive behavior with a health care provider and arrange for help if necessary. The best treatment for these symptoms depends upon what triggers them. As an example, a person who becomes aggressive during periods of confusion might best be treated by talking through the problem, while someone who becomes aggressive during delusions might require medication. Often, behavior improves once an underlying medical condition is treated. Caregivers can learn strategies to help lessen the number of triggers and confrontations.   Sleep problems -- Sleep disorders can be treated with either medicine or behavior changes or both: for example, limiting daytime naps, increasing physical activity, avoiding caffeine and alcohol in the evening. In some people, medication to help with sleep may be recommended, although these medications almost always  have side effects (eg, worsened confusion and increased risk of falls). Maintaining daily rhythms, using artificial lighting when needed during the day, and avoiding bright light exposures during the night may help maintain normal wake-sleep cycles.   COPING WITH DEMENTIA Being diagnosed with any form of dementia can be distressing and overwhelming for the person affected as well as their loved ones. For people with dementia -- It is important for people with early dementia to care for their physical and mental health. This means getting regular checkups, taking medicines if needed, eating a healthy diet, exercising regularly, getting enough sleep, and avoiding activities that may be risky. It is often helpful to talk to others through support groups or a counselor or social worker to discuss any feelings of anxiety, frustration, anger, loneliness, or depression. All of these feelings are normal, and dealing with these feelings can help you to feel more in control of your life and health. It can also help to talk to other people who are going through a similar experience. Another issue to consider is how to tell your family and friends about your diagnosis. Explaining the disease can help others to understand what to expect and how they can help, now and in the future. This can be especially helpful for children and grandchildren, who may not be familiar with the condition. While many people are able to live alone in the early stages of dementia, you may need help with tasks such as housekeeping, cooking, transportation, and paying bills. If possible, ask a friend  or family member for help making plans to deal with these and other issues as dementia progresses. Occupational therapists, and sometimes speech pathologists, can help to set up your home to minimize confusion and keep you independent for as long as possible. It's also important to establish Power of Attorney and Health Care Proxy statuses early,  before a financial or health care crisis happens. This involves completing paperwork to determine who can make decisions on your behalf if needed. In addition, you should discuss your preferences regarding issues that are likely to become important as your dementia worsens, including: ?Is health insurance available, and what does it cover? ?Where will I live? ?Who will make health care and end-of-life decisions if I can't make them for myself? ?Who will pay for care? A number of resources are available to assist in this type of planning.   For caregivers -- Dementia can also impose an enormous burden on families and other caregivers. People with dementia become less able to care for themselves as the condition progresses. If you are caring for someone with dementia, the following may help: ?Make a daily plan and prepare to be flexible if needed. ?Try to be patient when responding to repetitive questions, behaviors, or statements. ?Try not to argue or confront the person with dementia when they express mistaken ideas or facts. Change the subject or gently remind the person of an inaccuracy. Arguing or trying to convince a person of "the truth" is a natural reaction but it can be frustrating to all and can trigger unwanted behavior and feelings. ?Use memory aids such as writing out a list of daily activities, phone numbers, and instructions for usual tasks (ie, the telephone, microwave, etc). It may help if these are posted and easily visible so that the person need not remember to look for the aids. ?Establish calm and consistent nighttime routines to manage behavioral problems, which are often worst at night. Leave a night light on in the person's bedroom. ?Avoid major changes to the home environment (for example, rearranging furniture). ?Employ safety measures in the home, such as putting locks on medicine cabinets, keeping furniture in the same place to prevent falls, reducing clutter, removing  electrical appliances from the bathroom, installing grab bars in the bathroom, and setting the water heater below 120F. ?Help the person with personal care tasks as needed. It is not necessary to bathe every day, although a health care provider should be notified if the person develops sores in the mouth or genitals related to hygiene problems (eg, ill-fitting dentures, urine leakage). ?Speak slowly, present only one idea at a time, and be patient when waiting for responses. ?Encourage physical activity and exercise. Even a daily walk can help prevent physical decline and improve behavioral problems. ?Consider respite care. Respite care can provide a needed break and give you a chance to recharge. This is offered in many communities in the form of in-home care or adult day care. Caregiving can be an all-consuming experience, and it's essential to take time for yourself, take care of your own medical problems, and arrange for breaks when you need them. ?See if your area has a support group for people caring for loved ones with dementia. It can help to talk with other people who understand what you are going through.

## 2022-11-08 ENCOUNTER — Ambulatory Visit: Payer: Medicare Other | Admitting: Nurse Practitioner

## 2022-11-15 ENCOUNTER — Other Ambulatory Visit: Payer: Self-pay | Admitting: Nurse Practitioner

## 2022-11-15 NOTE — Telephone Encounter (Signed)
Refused allopurinol 100 mg refill request.  This was filled at [facility]   This request is from [employer].

## 2022-11-24 ENCOUNTER — Telehealth: Payer: Self-pay

## 2022-11-24 NOTE — Telephone Encounter (Signed)
Paperwork faxed back to [facility]

## 2022-12-06 ENCOUNTER — Telehealth: Payer: Self-pay

## 2022-12-06 NOTE — Telephone Encounter (Signed)
Paperwork faxed back to [facility]  

## 2023-01-15 NOTE — Patient Instructions (Signed)
Dementia Caregiver Guide Dementia is a term used to describe a number of symptoms that affect memory and thinking. The most common symptoms include: Memory loss. Trouble with language and communication. Trouble concentrating. Poor judgment and problems with reasoning. Wandering from home or public places. Extreme anxiety or depression. Being suspicious or having angry outbursts and accusations. Child-like behavior and language. Dementia can be frightening and confusing. And taking care of someone with dementia can be challenging. This guide provides tips to help you when providing care for a person with dementia. How to help manage lifestyle changes Dementia usually gets worse slowly over time. In the early stages, people with dementia can stay independent and safe with some help. In later stages, they need help with daily tasks such as dressing, grooming, and using the bathroom. There are actions you can take to help a person manage his or her life while living with this condition. Communicating When the person is talking or seems frustrated, make eye contact and hold the person's hand. Ask specific questions that need yes or no answers. Use simple words, short sentences, and a calm voice. Only give one direction at a time. When offering choices, limit the person to just one or two. Avoid correcting the person in a negative way. If the person is struggling to find the right words, gently try to help him or her. Preventing injury  Keep floors clear of clutter. Remove rugs, magazine racks, and floor lamps. Keep hallways well lit, especially at night. Put a handrail and nonslip mat in the bathtub or shower. Put childproof locks on cabinets that contain dangerous items, such as medicines, alcohol, guns, toxic cleaning items, sharp tools or utensils, matches, and lighters. For doors to the outside of the house, put the locks in places where the person cannot see or reach them easily. This will  help ensure that the person does not wander out of the house and get lost. Be prepared for emergencies. Keep a list of emergency phone numbers and addresses in a convenient area. Remove car keys and lock garage doors so that the person does not try to get in the car and drive. Have the person wear a bracelet that tracks locations and identifies the person as having memory problems. This should be worn at all times for safety. Helping with daily life  Keep the person on track with his or her routine. Try to identify areas where the person may need help. Be supportive, patient, calm, and encouraging. Gently remind the person that adjusting to changes takes time. Help with the tasks that the person has asked for help with. Keep the person involved in daily tasks and decisions as much as possible. Encourage conversation, but try not to get frustrated if the person struggles to find words or does not seem to appreciate your help. How to recognize stress Look for signs of stress in yourself and in the person you are caring for. If you notice signs of stress, take steps to manage it. Symptoms of stress include: Feeling anxious, irritable, frustrated, or angry. Denying that the person has dementia or that his or her symptoms will not improve. Feeling depressed, hopeless, or unappreciated. Difficulty sleeping. Difficulty concentrating. Developing stress-related health problems. Feeling like you have too little time for your own life. Follow these instructions at home: Take care of your health Make sure that you and the person you are caring for: Get regular sleep. Exercise regularly. Eat regular, nutritious meals. Take over-the-counter and prescription medicines only   as told by your health care providers. Drink enough fluid to keep your urine pale yellow. Attend all scheduled health care appointments.  General instructions Join a support group with others who are caregivers. Ask about  respite care resources. Respite care can provide short-term care for the person so that you can have a regular break from the stress of caregiving. Consider any safety risks and take steps to avoid them. Organize medicines in a pill box for each day of the week. Create a plan to handle any legal or financial matters. Get legal or financial advice if needed. Keep a calendar in a central location to remind the person of appointments or other activities. Where to find support: Many individuals and organizations offer support. These include: Support groups for people with dementia. Support groups for caregivers. Counselors or therapists. Home health care services. Adult day care centers. Where to find more information Centers for Disease Control and Prevention: www.cdc.gov Alzheimer's Association: www.alz.org Family Caregiver Alliance: www.caregiver.org Alzheimer's Foundation of America: www.alzfdn.org Contact a health care provider if: The person's health is rapidly getting worse. You are no longer able to care for the person. Caring for the person is affecting your physical and emotional health. You are feeling depressed or anxious about caring for the person. Get help right away if: The person threatens himself or herself, you, or anyone else. You feel depressed or sad, or feel that you want to harm yourself. If you ever feel like your loved one may hurt himself or herself or others, or if he or she shares thoughts about taking his or her own life, get help right away. You can go to your nearest emergency department or: Call your local emergency services (911 in the [place] Call a suicide crisis helpline, such as the National Suicide Prevention Lifeline at 1-800-273-8255 or 988 in the [place] This is open 24 hours a day in the [place] Text the Crisis Text Line at 741741 (in the [place] Summary Dementia is a term used to describe a number of symptoms that affect memory and thinking. Dementia  usually gets worse slowly over time. Take steps to reduce the person's risk of injury and to plan for future care. Caregivers need support, relief from caregiving, and time for their own lives. This information is not intended to replace advice given to you by your health care provider. Make sure you discuss any questions you have with your health care provider. Document Revised: 06/23/2021 Document Reviewed: 04/13/2020 Elsevier Patient Education  2023 Elsevier Inc.  

## 2023-01-16 ENCOUNTER — Other Ambulatory Visit: Payer: Self-pay | Admitting: Nurse Practitioner

## 2023-01-17 NOTE — Telephone Encounter (Signed)
Requested Prescriptions  Pending Prescriptions Disp Refills   KLOR-CON M20 20 MEQ tablet [Pharmacy Med Name: POTASSIUM CHLORIDE ER (DISP) TABS 20MEQ] 90 tablet 3    Sig: TAKE 1 TABLET DAILY     Endocrinology:  Minerals - Potassium Supplementation Failed - 01/16/2023  1:05 AM      Failed - Cr in normal range and within 360 days    Creatinine, Ser  Date Value Ref Range Status  09/27/2022 1.08 (H) 0.57 - 1.00 mg/dL Final         Passed - K in normal range and within 360 days    Potassium  Date Value Ref Range Status  09/27/2022 4.4 3.5 - 5.2 mmol/L Final         Passed - Valid encounter within last 12 months    Recent Outpatient Visits           3 months ago Memory changes   [place] Carmen, [place] T, NP   3 months ago Stage 3a chronic kidney disease ([place])   [place] [name], Henrine Screws T, NP   9 months ago Stage 3a chronic kidney disease ([place])   [place], Henrine Screws T, NP   1 year ago Stage 3a chronic kidney disease ([place])   [place], Henrine Screws T, NP   1 year ago Aortic atherosclerosis ([place])   [place], Barbaraann Faster, NP       Future Appointments             In 2 days [name], Barbaraann Faster, NP [place]

## 2023-01-19 ENCOUNTER — Encounter: Payer: Self-pay | Admitting: Nurse Practitioner

## 2023-01-19 ENCOUNTER — Ambulatory Visit (INDEPENDENT_AMBULATORY_CARE_PROVIDER_SITE_OTHER): Payer: Medicare Other | Admitting: Nurse Practitioner

## 2023-01-19 VITALS — BP 134/70 | HR 68 | Temp 97.9°F | Ht 62.0 in | Wt 152.7 lb

## 2023-01-19 DIAGNOSIS — F0280 Dementia in other diseases classified elsewhere without behavioral disturbance: Secondary | ICD-10-CM | POA: Diagnosis not present

## 2023-01-19 DIAGNOSIS — I700 Atherosclerosis of aorta: Secondary | ICD-10-CM | POA: Diagnosis not present

## 2023-01-19 DIAGNOSIS — K219 Gastro-esophageal reflux disease without esophagitis: Secondary | ICD-10-CM | POA: Diagnosis not present

## 2023-01-19 DIAGNOSIS — D472 Monoclonal gammopathy: Secondary | ICD-10-CM

## 2023-01-19 DIAGNOSIS — G309 Alzheimer's disease, unspecified: Secondary | ICD-10-CM | POA: Diagnosis not present

## 2023-01-19 DIAGNOSIS — M1A0710 Idiopathic chronic gout, right ankle and foot, without tophus (tophi): Secondary | ICD-10-CM | POA: Diagnosis not present

## 2023-01-19 DIAGNOSIS — N1831 Chronic kidney disease, stage 3a: Secondary | ICD-10-CM

## 2023-01-19 DIAGNOSIS — E538 Deficiency of other specified B group vitamins: Secondary | ICD-10-CM

## 2023-01-19 DIAGNOSIS — Z23 Encounter for immunization: Secondary | ICD-10-CM | POA: Diagnosis not present

## 2023-01-19 DIAGNOSIS — I129 Hypertensive chronic kidney disease with stage 1 through stage 4 chronic kidney disease, or unspecified chronic kidney disease: Secondary | ICD-10-CM | POA: Diagnosis not present

## 2023-01-19 DIAGNOSIS — E559 Vitamin D deficiency, unspecified: Secondary | ICD-10-CM | POA: Diagnosis not present

## 2023-01-19 DIAGNOSIS — M810 Age-related osteoporosis without current pathological fracture: Secondary | ICD-10-CM

## 2023-01-19 NOTE — Assessment & Plan Note (Signed)
Chronic, ongoing.  Continue collaboration with oncology, recent note reviewed.

## 2023-01-19 NOTE — Assessment & Plan Note (Signed)
Chronic, stable.  Continue daily supplement and adjust as needed.  Obtain B12 level.

## 2023-01-19 NOTE — Assessment & Plan Note (Signed)
Chronic, stable.  Continue daily supplement and adjust as needed.  Labs today.

## 2023-01-19 NOTE — Progress Notes (Signed)
BP 134/70 (BP Location: Left Arm, Patient Position: Sitting, Cuff Size: Normal)   Pulse 68   Temp 97.9 F (36.6 C)   Ht '5\' 2"'$  (1.575 m)   Wt 152 lb 11.2 oz (69.3 kg)   LMP  (LMP Unknown)   SpO2 95%   BMI 27.93 kg/m    Subjective:    Patient ID: Cynthia Hurst, female    DOB: [birth year], 88 y.o.   MRN: [id number]  HPI: Cynthia Hurst is a 88 y.o. female  Chief Complaint  Patient presents with   Dementia   Chronic Kidney Disease   Osteopenia   Daughter present at bedside with her today.  OSTEOPENIA Previous osteopenia and recent DEXA 06/13/22 noted osteoporosis.  Has been on Evista for years.  The BMD measured at [facility] 33% is 0.600 g/cm2 with a T-score of -3.2.  Past osteoporosis medications/treatments:  Adequate calcium & vitamin D: yes Weight bearing exercises: yes   MGUS: Saw Dr. Tasia Catchings last on 03/04/22 -- no changes made.  Had renal ultrasound on 02/14/2020 with normal findings, right-sided renal cysts measuring 1.8 cm and questionable mild bladder wall thickening. Denies any B symptoms at this time.  To return yearly.  GERD Have weaned her off medication and she is doing well. GERD control status: stable  Satisfied with current treatment? yes Heartburn frequency: occasional Medication side effects: no  Medication compliance: stable Dysphagia: no Odynophagia:  no Hematemesis: no Blood in stool: no EGD: no  DEMENTIA + HTN/HLD Her daughter reports they are monitoring her taking medication.  She saw neurology 11/07/22, diagnosis of Alzheimer's Dementia.  Continues on Aricept 10 MG at bedtime.  Continues to live with her husband, her children limit their driving.  Her family is trying to make sure she eats, as suspect she is missing meals on occasion.  They have Meals on Wheels, but Cynthia Hurst is picky and does not eat the meals all the time.  No falls recently.  No incontinence issues present.     Her daughter reports some family history in parents.   Satisfied  with current treatment? yes Duration of hypertension: chronic BP monitoring frequency: monthly BP range: unsure BP medication side effects: no Aspirin: no Recent stressors: no Recurrent headaches: no Visual changes: no Palpitations: no Dyspnea: no Chest pain: no Lower extremity edema: no Dizzy/lightheaded: no     09/27/2022   10:01 AM  MMSE - Mini Mental State Exam  Orientation to time 1  Orientation to Place 5  Registration 3  Attention/ Calculation 4  Recall 0  Language- name 2 objects 2  Language- repeat 1  Language- follow 3 step command 3  Language- read & follow direction 1  Write a sentence 1  Copy design 1  Total score 22     CHRONIC KIDNEY DISEASE Continues on Allopurinol for gout history, has not had a flare in several years. Recent labs continue to show stable CKD 3a.   CKD status: controlled Medications renally dose: yes Previous renal evaluation: no Pneumovax: Up To Date Influenza Vaccine:  Up To Date  Relevant past medical, surgical, family and social history reviewed and updated as indicated. Interim medical history since our last visit reviewed. Allergies and medications reviewed and updated.  Review of Systems  Constitutional:  Negative for activity change, appetite change, diaphoresis, fatigue and fever.  Respiratory:  Negative for cough, chest tightness and shortness of breath.   Cardiovascular:  Negative for chest pain, palpitations and leg swelling.  Gastrointestinal: Negative.  Musculoskeletal:  Negative for back pain.  Neurological: Negative.   Psychiatric/Behavioral: Negative.      Per HPI unless specifically indicated above     Objective:    BP 134/70 (BP Location: Left Arm, Patient Position: Sitting, Cuff Size: Normal)   Pulse 68   Temp 97.9 F (36.6 C)   Ht '5\' 2"'$  (1.575 m)   Wt 152 lb 11.2 oz (69.3 kg)   LMP  (LMP Unknown)   SpO2 95%   BMI 27.93 kg/m   Wt Readings from Last 3 Encounters:  01/19/23 152 lb 11.2 oz (69.3  kg)  11/07/22 162 lb (73.5 kg)  10/19/22 163 lb 9.6 oz (74.2 kg)    Physical Exam Vitals and nursing note reviewed.  Constitutional:      General: She is awake. She is not in acute distress.    Appearance: She is well-developed and well-groomed. She is not ill-appearing.  HENT:     Head: Normocephalic.     Right Ear: Hearing normal.     Left Ear: Hearing normal.  Eyes:     General: Lids are normal.        Right eye: No discharge.        Left eye: No discharge.     Conjunctiva/sclera: Conjunctivae normal.     Pupils: Pupils are equal, round, and reactive to light.  Neck:     Thyroid: No thyromegaly.     Vascular: No carotid bruit.  Cardiovascular:     Rate and Rhythm: Normal rate and regular rhythm.     Heart sounds: Normal heart sounds. No murmur heard.    No gallop.  Pulmonary:     Effort: Pulmonary effort is normal. No accessory muscle usage or respiratory distress.     Breath sounds: Normal breath sounds.  Abdominal:     General: Bowel sounds are normal.     Palpations: Abdomen is soft.     Tenderness: There is no abdominal tenderness.  Musculoskeletal:     Cervical back: Normal range of motion and neck supple.     Right lower leg: No edema.     Left lower leg: No edema.     Comments: Kyphosis present.  Lymphadenopathy:     Head:     Right side of head: No submental, submandibular, tonsillar, preauricular or posterior auricular adenopathy.     Left side of head: No submental, submandibular, tonsillar, preauricular or posterior auricular adenopathy.  Skin:    General: Skin is warm and dry.  Neurological:     Mental Status: She is alert.     Cranial Nerves: Cranial nerves 2-12 are intact.     Motor: Motor function is intact.     Coordination: Coordination is intact.     Gait: Gait is intact.     Deep Tendon Reflexes: Reflexes are normal and symmetric.     Reflex Scores:      Brachioradialis reflexes are 2+ on the right side and 2+ on the left side.      Patellar  reflexes are 2+ on the right side and 2+ on the left side.    Comments: Not able to report year, 2020 reported.  Is able to report day of week and month.  Able to report location and state + President.  Psychiatric:        Attention and Perception: Attention normal.        Mood and Affect: Mood normal.        Speech: Speech normal.  Behavior: Behavior normal. Behavior is cooperative.        Thought Content: Thought content normal.     Results for orders placed or performed in visit on 09/27/22  CBC with Differential/Platelet  Result Value Ref Range   WBC 5.8 3.4 - 10.8 x10E3/uL   RBC 4.07 3.77 - 5.28 x10E6/uL   Hemoglobin 12.5 11.1 - 15.9 g/dL   Hematocrit 38.3 34.0 - 46.6 %   MCV 94 79 - 97 fL   MCH 30.7 26.6 - 33.0 pg   MCHC 32.6 31.5 - 35.7 g/dL   RDW 14.6 11.7 - 15.4 %   Platelets 158 150 - 450 x10E3/uL   Neutrophils 60 Not Estab. %   Lymphs 28 Not Estab. %   Monocytes 9 Not Estab. %   Eos 2 Not Estab. %   Basos 1 Not Estab. %   Neutrophils Absolute 3.5 1.4 - 7.0 x10E3/uL   Lymphocytes Absolute 1.6 0.7 - 3.1 x10E3/uL   Monocytes Absolute 0.5 0.1 - 0.9 x10E3/uL   EOS (ABSOLUTE) 0.1 0.0 - 0.4 x10E3/uL   Basophils Absolute 0.0 0.0 - 0.2 x10E3/uL   Immature Granulocytes 0 Not Estab. %   Immature Grans (Abs) 0.0 0.0 - 0.1 x10E3/uL  Comprehensive metabolic panel  Result Value Ref Range   Glucose 85 70 - 99 mg/dL   BUN 14 8 - 27 mg/dL   Creatinine, Ser 1.08 (H) 0.57 - 1.00 mg/dL   eGFR 50 (L) >59 mL/min/1.73   BUN/Creatinine Ratio 13 12 - 28   Sodium 142 134 - 144 mmol/L   Potassium 4.4 3.5 - 5.2 mmol/L   Chloride 104 96 - 106 mmol/L   CO2 25 20 - 29 mmol/L   Calcium 9.9 8.7 - 10.3 mg/dL   Total Protein 6.6 6.0 - 8.5 g/dL   Albumin 4.0 3.7 - 4.7 g/dL   Globulin, Total 2.6 1.5 - 4.5 g/dL   Albumin/Globulin Ratio 1.5 1.2 - 2.2   Bilirubin Total 0.6 0.0 - 1.2 mg/dL   Alkaline Phosphatase 79 44 - 121 IU/L   AST 16 0 - 40 IU/L   ALT 6 0 - 32 IU/L  Lipid Panel w/o  Chol/HDL Ratio  Result Value Ref Range   Cholesterol, Total 178 100 - 199 mg/dL   Triglycerides 128 0 - 149 mg/dL   HDL 73 >39 mg/dL   VLDL Cholesterol Cal 22 5 - 40 mg/dL   LDL Chol Calc (NIH) 83 0 - 99 mg/dL  Uric acid  Result Value Ref Range   Uric Acid 4.7 3.1 - 7.9 mg/dL  Magnesium  Result Value Ref Range   Magnesium 1.9 1.6 - 2.3 mg/dL  Vitamin B12  Result Value Ref Range   Vitamin B-12 >2000 (H) 232 - 1245 pg/mL  RPR  Result Value Ref Range   RPR Ser Ql Non Reactive Non Reactive  TSH  Result Value Ref Range   TSH 3.110 0.450 - 4.500 uIU/mL      Assessment & Plan:   Problem List Items Addressed This Visit       Cardiovascular and Mediastinum   Aortic atherosclerosis (HCC) (Chronic)    Chronic. Noted on imaging on 03/22/2018.  At this time will not initiate statin due to advanced and patient wishes.  Recommend daily Baby ASA 81 MG for prevention.      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  BP below goal in  office today for age on recheck.  Continue current medication regimen and adjust as needed -- could consider reduction of medication in future if BP remains stable.  LABS: CMP, CBC, TSH.  Renal dose medications as needed.  Recommend she continue to monitor BP at home at least a few days a week and document + focus on DASH diet.        Relevant Orders   Comprehensive metabolic panel   TSH     Digestive   GERD (gastroesophageal reflux disease) (Chronic)    Stable without medication, will remain off of this unless needed for medication reduction.        Nervous and Auditory   Alzheimer disease ([place]) - Primary    Chronic, progressive.  Continue Aricept daily and collaboration with neurology.  Recent note reviewed.        Musculoskeletal and Integument   Age related osteoporosis (Chronic)    Ongoing.  Will continue Evista at this time and plan on repeat DEXA in 2 years,  2025. No recent falls or fractures.  Continue supplements.        Relevant Orders   VITAMIN D 25 Hydroxy (Vit-D Deficiency, Fractures)     Genitourinary   CKD (chronic kidney disease), stage III (HCC) (Chronic)    Chronic with CKD 3a.  Monitor CMP every 6 months and refer to nephrology as needed if worsening CKD.  Renal dose medications as needed.        Relevant Orders   CBC with Differential/Platelet   Comprehensive metabolic panel   TSH     Other   Gout (Chronic)    Chronic, stable with Allopurinol at 100 MG.  Continue current regimen and adjust as needed.   Uric acid level today.      Relevant Orders   Uric acid   MGUS (monoclonal gammopathy of unknown significance) (Chronic)    Chronic, ongoing.  Continue collaboration with oncology, recent note reviewed.      Vitamin B12 deficiency (Chronic)    Chronic, stable.  Continue daily supplement and adjust as needed.  Obtain B12 level.      Vitamin D deficiency (Chronic)    Chronic, stable.  Continue daily supplement and adjust as needed.  Labs today.      Relevant Orders   VITAMIN D 25 Hydroxy (Vit-D Deficiency, Fractures)   Other Visit Diagnoses     Flu vaccine need       Flu vaccine in office today.   Relevant Orders   Flu Vaccine QUAD High Dose(Fluad) (Completed)        Follow up plan: Return in about 6 months (around 07/20/2023) for DEMENTIA, HTN/HLD, OSTEOPOROSIS.

## 2023-01-19 NOTE — Assessment & Plan Note (Signed)
Chronic, stable with Allopurinol at 100 MG.  Continue current regimen and adjust as needed.   Uric acid level today.

## 2023-01-19 NOTE — Assessment & Plan Note (Signed)
Stable without medication, will remain off of this unless needed for medication reduction.

## 2023-01-19 NOTE — Assessment & Plan Note (Signed)
Chronic, stable.  BP below goal in office today for age on recheck.  Continue current medication regimen and adjust as needed -- could consider reduction of medication in future if BP remains stable.  LABS: CMP, CBC, TSH.  Renal dose medications as needed.  Recommend she continue to monitor BP at home at least a few days a week and document + focus on DASH diet.

## 2023-01-19 NOTE — Assessment & Plan Note (Signed)
Chronic, progressive.  Continue Aricept daily and collaboration with neurology.  Recent note reviewed.

## 2023-01-19 NOTE — Assessment & Plan Note (Signed)
Ongoing.  Will continue Evista at this time and plan on repeat DEXA in 2 years, 2025. No recent falls or fractures.  Continue supplements.

## 2023-01-19 NOTE — Assessment & Plan Note (Signed)
Chronic with CKD 3a.  Monitor CMP every 6 months and refer to nephrology as needed if worsening CKD.  Renal dose medications as needed.

## 2023-01-19 NOTE — Assessment & Plan Note (Signed)
Chronic. Noted on imaging on 03/22/2018.  At this time will not initiate statin due to advanced and patient wishes.  Recommend daily Baby ASA 81 MG for prevention.

## 2023-01-20 LAB — URIC ACID: Uric Acid: 4.5 mg/dL (ref 3.1–7.9)

## 2023-01-20 LAB — COMPREHENSIVE METABOLIC PANEL
ALT: 8 IU/L (ref 0–32)
AST: 21 IU/L (ref 0–40)
Albumin/Globulin Ratio: 1.6 (ref 1.2–2.2)
Albumin: 4.1 g/dL (ref 3.7–4.7)
Alkaline Phosphatase: 79 IU/L (ref 44–121)
BUN/Creatinine Ratio: 13 (ref 12–28)
BUN: 12 mg/dL (ref 8–27)
Bilirubin Total: 0.8 mg/dL (ref 0.0–1.2)
CO2: 25 mmol/L (ref 20–29)
Calcium: 9.9 mg/dL (ref 8.7–10.3)
Chloride: 103 mmol/L (ref 96–106)
Creatinine, Ser: 0.95 mg/dL (ref 0.57–1.00)
Globulin, Total: 2.5 g/dL (ref 1.5–4.5)
Glucose: 88 mg/dL (ref 70–99)
Potassium: 4.3 mmol/L (ref 3.5–5.2)
Sodium: 142 mmol/L (ref 134–144)
Total Protein: 6.6 g/dL (ref 6.0–8.5)
eGFR: 59 mL/min/{1.73_m2} — ABNORMAL LOW (ref >59)

## 2023-01-20 LAB — CBC WITH DIFFERENTIAL/PLATELET
Basophils Absolute: 0 10*3/uL (ref 0.0–0.2)
Basos: 0 %
EOS (ABSOLUTE): 0.2 10*3/uL (ref 0.0–0.4)
Eos: 3 %
Hematocrit: 40.8 % (ref 34.0–46.6)
Hemoglobin: 13.2 g/dL (ref 11.1–15.9)
Immature Grans (Abs): 0 10*3/uL (ref 0.0–0.1)
Immature Granulocytes: 0 %
Lymphocytes Absolute: 1.4 10*3/uL (ref 0.7–3.1)
Lymphs: 24 %
MCH: 30.7 pg (ref 26.6–33.0)
MCHC: 32.4 g/dL (ref 31.5–35.7)
MCV: 95 fL (ref 79–97)
Monocytes Absolute: 0.6 10*3/uL (ref 0.1–0.9)
Monocytes: 10 %
Neutrophils Absolute: 3.6 10*3/uL (ref 1.4–7.0)
Neutrophils: 63 %
Platelets: 175 10*3/uL (ref 150–450)
RBC: 4.3 x10E6/uL (ref 3.77–5.28)
RDW: 14.2 % (ref 11.7–15.4)
WBC: 5.7 10*3/uL (ref 3.4–10.8)

## 2023-01-20 LAB — TSH: TSH: 3.02 u[IU]/mL (ref 0.450–4.500)

## 2023-01-20 LAB — VITAMIN D 25 HYDROXY (VIT D DEFICIENCY, FRACTURES): Vit D, 25-Hydroxy: 36.8 ng/mL (ref 30.0–100.0)

## 2023-01-20 NOTE — Progress Notes (Signed)
Good morning, please let Nuha know labs have returned and overall these remains at baseline for her.  No medication changes needed.  Waiting on uric acid result and if this is abnormal I will let her know.  Any questions? Keep being amazing!!  Thank you for allowing me to participate in your care.  I appreciate you. Kindest regards, Jago Carton

## 2023-02-15 ENCOUNTER — Other Ambulatory Visit: Payer: Self-pay | Admitting: Nurse Practitioner

## 2023-02-15 ENCOUNTER — Telehealth: Payer: Self-pay

## 2023-02-15 MED ORDER — ALENDRONATE SODIUM 70 MG PO TABS: 70.0000 mg | ORAL_TABLET | ORAL | 11 refills | Status: DC

## 2023-02-15 MED ORDER — ALLOPURINOL 100 MG PO TABS: 100.0000 mg | ORAL_TABLET | Freq: Every day | ORAL | 0 refills | Status: DC

## 2023-02-15 MED ORDER — ALLOPURINOL 100 MG PO TABS: 100.0000 mg | ORAL_TABLET | Freq: Every day | ORAL | 1 refills | Status: DC

## 2023-02-15 NOTE — Addendum Note (Signed)
Addended by: Matilde Sprang on: 02/15/2023 05:28 PM   Modules accepted: Orders

## 2023-02-15 NOTE — Telephone Encounter (Signed)
Medication Refill - Medication: allopurinol (ZYLOPRIM) 100 MG tablet (pt is out of medication)  Lattie Haw is requesting a refill be sent to [facility] home delivery as well as a short supply to local pharmacy [facility] [phone], [place], [place]. As pt will be out of the medication while it's mailed by [facility] home delivery  Medication raloxifene (EVISTA) 60 MG tablet is now Tier 3 and very expensive. It was advised to reach out to PCP and ask if the tier could be switched or if an alternative could be provided.  Has the patient contacted their pharmacy? Yes.    (Agent: If yes, when and what did the pharmacy advise?)  Preferred Pharmacy (with phone number or street name):  [place], [address]  [place] [address]  Phone: [phone] Fax: [phone]  Hours: Not open 24 hours   [place], [address] [place]  [place] [address]  Phone: [phone] Fax: [phone]  Hours: Not open 24 hours   Has the patient been seen for an appointment in the last year OR does the patient have an upcoming appointment? Yes.    Agent: Please be advised that RX refills may take up to 3 business days. We ask that you follow-up with your pharmacy.

## 2023-02-15 NOTE — Addendum Note (Signed)
Addended by: Matilde Sprang on: 02/15/2023 05:29 PM   Modules accepted: Orders

## 2023-02-15 NOTE — Telephone Encounter (Signed)
Medication raloxifene (EVISTA) 60 MG tablet is now Tier 3 and very expensive. It was advised to reach out to PCP and ask if the tier could be switched or if an alternative could be provided.

## 2023-02-15 NOTE — Telephone Encounter (Signed)
Will send to [facility] and a short supply to [facility] as requested.

## 2023-02-15 NOTE — Telephone Encounter (Signed)
Rx is too soon for refill.  Requested Prescriptions  Pending Prescriptions Disp Refills   allopurinol (ZYLOPRIM) 100 MG tablet 90 tablet 4    Sig: Take 1 tablet (100 mg total) by mouth daily.     Endocrinology:  Gout Agents - allopurinol Passed - 02/15/2023  2:10 PM      Passed - Uric Acid in normal range and within 360 days    Uric Acid  Date Value Ref Range Status  01/19/2023 4.5 3.1 - 7.9 mg/dL Final    Comment:               Therapeutic target for gout patients: <6.0         Passed - Cr in normal range and within 360 days    Creatinine, Ser  Date Value Ref Range Status  01/19/2023 0.95 0.57 - 1.00 mg/dL Final         Passed - Valid encounter within last 12 months    Recent Outpatient Visits           3 weeks ago Alzheimer disease ([place])   [place], Pelican T, NP   3 months ago Memory changes   [place], Welaka T, NP   4 months ago Stage 3a chronic kidney disease ([place])   [place], [name] T, NP   10 months ago Stage 3a chronic kidney disease ([place])   [place], Henrine Screws T, NP   1 year ago Stage 3a chronic kidney disease ([place])   [place], Henrine Screws T, NP       Future Appointments             In 5 months [name], Barbaraann Faster, NP [place], [facility]            Passed - CBC within normal limits and completed in the last 12 months    WBC  Date Value Ref Range Status  01/19/2023 5.7 3.4 - 10.8 x10E3/uL Final  02/25/2022 6.2 4.0 - 10.5 K/uL Final   RBC  Date Value Ref Range Status  01/19/2023 4.30 3.77 - 5.28 x10E6/uL Final  02/25/2022 4.12 3.87 - 5.11 MIL/uL Final   Hemoglobin  Date Value Ref Range Status  01/19/2023 13.2 11.1 - 15.9 g/dL Final   Hematocrit  Date Value Ref Range Status  01/19/2023 40.8 34.0 - 46.6 % Final   MCHC  Date Value Ref Range Status   01/19/2023 32.4 31.5 - 35.7 g/dL Final  02/25/2022 33.6 30.0 - 36.0 g/dL Final   [facility]  Date Value Ref Range Status  01/19/2023 30.7 26.6 - 33.0 pg Final  02/25/2022 31.1 26.0 - 34.0 pg Final   MCV  Date Value Ref Range Status  01/19/2023 95 79 - 97 fL Final   No results found for: "PLTCOUNTKUC", "LABPLAT", "POCPLA" RDW  Date Value Ref Range Status  01/19/2023 14.2 11.7 - 15.4 % Final

## 2023-02-15 NOTE — Telephone Encounter (Signed)
Sent request for Evista to provider in a separate encounter.

## 2023-02-17 NOTE — Telephone Encounter (Signed)
Made patient's daughter aware of recommendations from provider, she verbalized understanding

## 2023-02-23 ENCOUNTER — Other Ambulatory Visit: Payer: Self-pay

## 2023-02-23 DIAGNOSIS — D472 Monoclonal gammopathy: Secondary | ICD-10-CM

## 2023-02-24 ENCOUNTER — Inpatient Hospital Stay: Payer: Medicare Other | Attending: Oncology

## 2023-02-24 DIAGNOSIS — I129 Hypertensive chronic kidney disease with stage 1 through stage 4 chronic kidney disease, or unspecified chronic kidney disease: Secondary | ICD-10-CM | POA: Diagnosis not present

## 2023-02-24 DIAGNOSIS — D472 Monoclonal gammopathy: Secondary | ICD-10-CM | POA: Diagnosis not present

## 2023-02-24 DIAGNOSIS — N183 Chronic kidney disease, stage 3 unspecified: Secondary | ICD-10-CM | POA: Insufficient documentation

## 2023-02-24 DIAGNOSIS — R768 Other specified abnormal immunological findings in serum: Secondary | ICD-10-CM | POA: Insufficient documentation

## 2023-02-24 LAB — CMP (CANCER CENTER ONLY)
ALT: 9 U/L (ref 0–44)
AST: 29 U/L (ref 15–41)
Albumin: 3.6 g/dL (ref 3.5–5.0)
Alkaline Phosphatase: 61 U/L (ref 38–126)
Anion gap: 7 (ref 5–15)
BUN: 14 mg/dL (ref 8–23)
CO2: 25 mmol/L (ref 22–32)
Calcium: 9.1 mg/dL (ref 8.9–10.3)
Chloride: 106 mmol/L (ref 98–111)
Creatinine: 1.05 mg/dL — ABNORMAL HIGH (ref 0.44–1.00)
GFR, Estimated: 52 mL/min — ABNORMAL LOW (ref >60)
Glucose, Bld: 165 mg/dL — ABNORMAL HIGH (ref 70–99)
Potassium: 3.6 mmol/L (ref 3.5–5.1)
Sodium: 138 mmol/L (ref 135–145)
Total Bilirubin: 0.8 mg/dL (ref 0.3–1.2)
Total Protein: 6.9 g/dL (ref 6.5–8.1)

## 2023-02-24 LAB — CBC WITH DIFFERENTIAL (CANCER CENTER ONLY)
Abs Immature Granulocytes: 0.01 10*3/uL (ref 0.00–0.07)
Basophils Absolute: 0 10*3/uL (ref 0.0–0.1)
Basophils Relative: 0 %
Eosinophils Absolute: 0.1 10*3/uL (ref 0.0–0.5)
Eosinophils Relative: 2 %
HCT: 36.2 % (ref 36.0–46.0)
Hemoglobin: 12 g/dL (ref 12.0–15.0)
Immature Granulocytes: 0 %
Lymphocytes Relative: 25 %
Lymphs Abs: 1.3 10*3/uL (ref 0.7–4.0)
MCH: 30.8 pg (ref 26.0–34.0)
MCHC: 33.1 g/dL (ref 30.0–36.0)
MCV: 93.1 fL (ref 80.0–100.0)
Monocytes Absolute: 0.5 10*3/uL (ref 0.1–1.0)
Monocytes Relative: 9 %
Neutro Abs: 3.4 10*3/uL (ref 1.7–7.7)
Neutrophils Relative %: 64 %
Platelet Count: 148 10*3/uL — ABNORMAL LOW (ref 150–400)
RBC: 3.89 MIL/uL (ref 3.87–5.11)
RDW: 14.1 % (ref 11.5–15.5)
WBC Count: 5.3 10*3/uL (ref 4.0–10.5)
nRBC: 0 % (ref 0.0–0.2)

## 2023-02-27 LAB — KAPPA/LAMBDA LIGHT CHAINS
Kappa free light chain: 31.7 mg/L — ABNORMAL HIGH (ref 3.3–19.4)
Kappa, lambda light chain ratio: 1.38 (ref 0.26–1.65)
Lambda free light chains: 23 mg/L (ref 5.7–26.3)

## 2023-03-01 LAB — MULTIPLE MYELOMA PANEL, SERUM
Albumin SerPl Elph-Mcnc: 3.7 g/dL (ref 2.9–4.4)
Albumin/Glob SerPl: 1.5 (ref 0.7–1.7)
Alpha 1: 0.3 g/dL (ref 0.0–0.4)
Alpha2 Glob SerPl Elph-Mcnc: 0.4 g/dL (ref 0.4–1.0)
B-Globulin SerPl Elph-Mcnc: 0.7 g/dL (ref 0.7–1.3)
Gamma Glob SerPl Elph-Mcnc: 1.1 g/dL (ref 0.4–1.8)
Globulin, Total: 2.5 g/dL (ref 2.2–3.9)
IgA: 208 mg/dL (ref 64–422)
IgG (Immunoglobin G), Serum: 1156 mg/dL (ref 586–1602)
IgM (Immunoglobulin M), Srm: 230 mg/dL — ABNORMAL HIGH (ref 26–217)
Total Protein ELP: 6.2 g/dL (ref 6.0–8.5)

## 2023-03-02 ENCOUNTER — Encounter: Payer: Self-pay | Admitting: Oncology

## 2023-03-02 ENCOUNTER — Inpatient Hospital Stay (HOSPITAL_BASED_OUTPATIENT_CLINIC_OR_DEPARTMENT_OTHER): Payer: Medicare Other | Admitting: Oncology

## 2023-03-02 VITALS — BP 148/66 | HR 52 | Temp 97.1°F | Resp 18 | Wt 157.7 lb

## 2023-03-02 DIAGNOSIS — D472 Monoclonal gammopathy: Secondary | ICD-10-CM | POA: Diagnosis not present

## 2023-03-02 DIAGNOSIS — I129 Hypertensive chronic kidney disease with stage 1 through stage 4 chronic kidney disease, or unspecified chronic kidney disease: Secondary | ICD-10-CM | POA: Diagnosis not present

## 2023-03-02 DIAGNOSIS — R768 Other specified abnormal immunological findings in serum: Secondary | ICD-10-CM | POA: Diagnosis not present

## 2023-03-02 DIAGNOSIS — N1831 Chronic kidney disease, stage 3a: Secondary | ICD-10-CM

## 2023-03-02 DIAGNOSIS — N183 Chronic kidney disease, stage 3 unspecified: Secondary | ICD-10-CM | POA: Diagnosis not present

## 2023-03-02 NOTE — Assessment & Plan Note (Signed)
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 nondetectable M protein on protein electrophoresis.  Elevated Kappa free light chain level with normal light chain ratio. Immunofixation showed IgA monoclonal protein with kappa restriction. Labs reviewed and discussed with patient Patient has normal hemoglobin, stable renal function. No intervention needed.   I recommend annual follow-up.

## 2023-03-02 NOTE — Assessment & Plan Note (Addendum)
Encourage oral hydration and avoid nephrotoxins.   

## 2023-03-02 NOTE — Progress Notes (Signed)
Hematology/Oncology Progress note [phone] [phone]     REASON FOR VISIT Follow up for management of MGUS  ASSESSMENT & PLAN:   CKD (chronic kidney disease), stage III ([place]) Encourage oral hydration and avoid nephrotoxins.    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 nondetectable M protein on protein electrophoresis.  Elevated Kappa free light chain level with normal light chain ratio. Immunofixation showed IgA monoclonal protein with kappa restriction. Labs reviewed and discussed with patient Patient has normal hemoglobin, stable renal function. No intervention needed.   I recommend annual follow-up.  Orders Placed This Encounter  Procedures   CBC with Differential ([place] Only)    Standing Status:   Future    Standing Expiration Date:   03/01/2024   CMP ([place] only)    Standing Status:   Future    Standing Expiration Date:   03/01/2024   Kappa/lambda light chains    Standing Status:   Future    Standing Expiration Date:   03/01/2024   Multiple Myeloma Panel (SPEP&IFE w/QIG)    Standing Status:   Future    Standing Expiration Date:   03/01/2024   Follow-up in 1 year All questions were answered. The patient knows to call the clinic with any problems, questions or concerns.  Earlie Server, MD, PhD [facility] Hematology Oncology 03/02/2023    HISTORY OF PRESENTING ILLNESS:  Cynthia Hurst is a  88 y.o.  female presents for follow-up of IgM MGUS Patient was referred to see a see rheumatology for evaluation of positive ANA, joint pain.  Lab workup reviewed hypercalcemia of 11, normal PTH, normal 25-hydroxy vitamin D level, normal TSH, SPEP showed  monoclonal spike of 0.4.  Immunofixation showed elevated IgA monoclonal protein with kappa light chain specificity. Patient reports feeling well, she denies any fatigue, weight loss, back pain history of fracture.  She has bilateral knee  replacement reports having posterior thigh pain bilaterally. Patient denies taking any calcium supplements.  Never smoker  # Bone marrow biopsy results was reviewed with patient and her family members in details.  Showed 2% plasma cells, consistent with MGUS  .INTERVAL HISTORY Cynthia Hurst is a 88 y.o. female who has above history reviewed by me today presents for follow-up visit for management of IgA MGUS. Was accompanied by daughter. + Fatigue, chronic stable.  No new complaints.  Review of Systems  Constitutional:  Negative for chills, fever, malaise/fatigue and weight loss.  HENT:  Negative for sore throat.   Eyes:  Negative for redness.  Respiratory:  Negative for cough, shortness of breath and wheezing.   Cardiovascular:  Negative for chest pain, palpitations and leg swelling.  Gastrointestinal:  Negative for abdominal pain, blood in stool, nausea and vomiting.  Genitourinary:  Negative for dysuria.  Musculoskeletal:  Negative for myalgias.  Skin:  Negative for rash.  Neurological:  Negative for dizziness, tingling and tremors.  Endo/Heme/Allergies:  Does not bruise/bleed easily.  Psychiatric/Behavioral:  Negative for hallucinations.     MEDICAL HISTORY:  Past Medical History:  Diagnosis Date   Chronic kidney disease    GERD (gastroesophageal reflux disease)    Gout    Hypertension    Hypopotassemia    Osteoarthritis    Osteoporosis    LUMBAR SPINE   Vitamin B 12 deficiency    Vitamin D deficiency     SURGICAL HISTORY: Past Surgical History:  Procedure Laterality Date   ABDOMINAL HYSTERECTOMY  PARTIAL   EYE SURGERY Left 05/2018   cataract surgery    JOINT REPLACEMENT Bilateral 2010,2011   KNEE    SOCIAL HISTORY: Social History   Socioeconomic History   Marital status: Married    Spouse name: Not on file   Number of children: Not on file   Years of education: Not on file   Highest education level: High school graduate  Occupational History   Not  on file  Tobacco Use   Smoking status: Never   Smokeless tobacco: Never  Vaping Use   Vaping Use: Never used  Substance and Sexual Activity   Alcohol use: No   Drug use: No   Sexual activity: Yes  Other Topics Concern   Not on file  Social History Narrative   Not on file   Social Determinants of Health   Financial Resource Strain: Low Risk  (08/04/2022)   Overall Financial Resource Strain (CARDIA)    Difficulty of Paying Living Expenses: Not hard at all  Food Insecurity: No Food Insecurity (08/04/2022)   Hunger Vital Sign    Worried About Running Out of Food in the Last Year: Never true    [place] in the Last Year: Never true  Transportation Needs: No Transportation Needs (08/04/2022)   PRAPARE - Hydrologist (Medical): No    Lack of Transportation (Non-Medical): No  Physical Activity: Inactive (08/04/2022)   Exercise Vital Sign    Days of Exercise per Week: 0 days    Minutes of Exercise per Session: 0 min  Stress: No Stress Concern Present (08/04/2022)   [place]    Feeling of Stress : Not at all  Social Connections: Moderately Integrated (08/04/2022)   Social Connection and Isolation Panel [NHANES]    Frequency of Communication with Friends and Family: More than three times a week    Frequency of Social Gatherings with Friends and Family: More than three times a week    Attends Religious Services: More than 4 times per year    Active Member of [employer] or Organizations: No    Attends Archivist Meetings: Never    Marital Status: Married  Human resources officer Violence: Not At Risk (08/04/2022)   Humiliation, Afraid, Rape, and Kick questionnaire    Fear of Current or Ex-Partner: No    Emotionally Abused: No    Physically Abused: No    Sexually Abused: No    FAMILY HISTORY: Family History  Problem Relation Age of Onset   Heart disease Mother    Cancer Father     Alzheimer's disease Father    Breast cancer Neg Hx     ALLERGIES:  has No Known Allergies.  MEDICATIONS:  Current Outpatient Medications  Medication Sig Dispense Refill   alendronate (FOSAMAX) 70 MG tablet Take 1 tablet (70 mg total) by mouth every 7 (seven) days. Take with a full glass of water on an empty stomach. 4 tablet 11   allopurinol (ZYLOPRIM) 100 MG tablet Take 1 tablet (100 mg total) by mouth daily. 15 tablet 0   atenolol (TENORMIN) 100 MG tablet Take 1 tablet (100 mg total) by mouth daily. 90 tablet 4   cholecalciferol (VITAMIN D) 1000 UNITS tablet Take 1,000 Units by mouth daily.     cyanocobalamin 2000 MCG tablet Take 2,000 mcg by mouth daily.     Diclofenac Sodium 3 % GEL Apply 1 application topically 2 (two) times daily as  needed (for back pain). 100 g 0   donepezil (ARICEPT) 10 MG tablet Take 1 tablet (10 mg total) by mouth at bedtime. 90 tablet 4   NIFEdipine (ADALAT CC) 60 MG 24 hr tablet Take 1 tablet (60 mg total) by mouth daily. 90 tablet 4   potassium chloride SA (KLOR-CON M20) 20 MEQ tablet TAKE 1 TABLET DAILY 90 tablet 0   No current facility-administered medications for this visit.     PHYSICAL EXAMINATION: ECOG PERFORMANCE STATUS: 0 - Asymptomatic Vitals:   03/02/23 1010  BP: (!) 148/66  Pulse: (!) 52  Resp: 18  Temp: (!) 97.1 F (36.2 C)   Filed Weights   03/02/23 1010  Weight: 157 lb 11.2 oz (71.5 kg)    Physical Exam Constitutional:      General: She is not in acute distress.    Comments: Patient ambulates independently  HENT:     Head: Normocephalic and atraumatic.  Eyes:     General: No scleral icterus.    Pupils: Pupils are equal, round, and reactive to light.  Cardiovascular:     Rate and Rhythm: Normal rate and regular rhythm.     Heart sounds: Normal heart sounds.  Pulmonary:     Effort: Pulmonary effort is normal. No respiratory distress.     Breath sounds: Normal breath sounds. No wheezing.  Abdominal:     General: Bowel  sounds are normal. There is no distension.     Palpations: Abdomen is soft.  Musculoskeletal:        General: No deformity. Normal range of motion.     Cervical back: Normal range of motion.  Skin:    General: Skin is warm and dry.     Findings: No erythema or rash.  Neurological:     Mental Status: She is alert. Mental status is at baseline.     Cranial Nerves: No cranial nerve deficit.  Psychiatric:        Mood and Affect: Mood normal.      LABORATORY DATA:  I have reviewed the data as listed    Latest Ref Rng & Units 02/24/2023   10:31 AM 01/19/2023    9:45 AM 09/27/2022   10:30 AM  CBC  WBC 4.0 - 10.5 K/uL 5.3  5.7  5.8   Hemoglobin 12.0 - 15.0 g/dL 12.0  13.2  12.5   Hematocrit 36.0 - 46.0 % 36.2  40.8  38.3   Platelets 150 - 400 K/uL 148  175  158       Latest Ref Rng & Units 02/24/2023   10:30 AM 01/19/2023    9:45 AM 09/27/2022   10:30 AM  CMP  Glucose 70 - 99 mg/dL 165  88  85   BUN 8 - 23 mg/dL 14  12  14    Creatinine 0.44 - 1.00 mg/dL 1.05  0.95  1.08   Sodium 135 - 145 mmol/L 138  142  142   Potassium 3.5 - 5.1 mmol/L 3.6  4.3  4.4   Chloride 98 - 111 mmol/L 106  103  104   CO2 22 - 32 mmol/L 25  25  25    Calcium 8.9 - 10.3 mg/dL 9.1  9.9  9.9   Total Protein 6.5 - 8.1 g/dL 6.9  6.6  6.6   Total Bilirubin 0.3 - 1.2 mg/dL 0.8  0.8  0.6   Alkaline Phos 38 - 126 U/L 61  79  79   AST 15 - 41 U/L 29  21  16   ALT 0 - 44 U/L 9  8  6        03/12/2018 multiple myeloma work up  [facility] clinic: SPEP showed  monoclonal spike of 0.4.  Immunofixation showed elevated IgA monoclonal protein with kappa light chain specificity.Urine light chain ratio elevated at 12.54, no monoclonal spike on UPEP.   Lab Results  Component Value Date   TOTALPROTELP 6.2 02/24/2023   ALBUMINELP 3.6 10/19/2018   A1GS 0.3 10/19/2018   A2GS 0.6 10/19/2018   BETS 0.9 10/19/2018   GAMS 1.2 10/19/2018   MSPIKE Not Observed 10/19/2018   SPEI Comment 10/19/2018   Lab Results  Component Value  Date   KPAFRELGTCHN 31.7 (H) 02/24/2023   LAMBDASER 23.0 02/24/2023   KAPLAMBRATIO 1.38 02/24/2023

## 2023-03-03 ENCOUNTER — Ambulatory Visit: Payer: Medicare Other | Admitting: Oncology

## 2023-03-30 ENCOUNTER — Telehealth: Payer: Self-pay

## 2023-03-30 NOTE — Telephone Encounter (Signed)
Paperwork faxed back to [facility]

## 2023-04-17 ENCOUNTER — Other Ambulatory Visit: Payer: Self-pay | Admitting: Nurse Practitioner

## 2023-04-18 NOTE — Telephone Encounter (Signed)
Requested Prescriptions  Pending Prescriptions Disp Refills   KLOR-CON M20 20 MEQ tablet [Pharmacy Med Name: POTASSIUM CHLORIDE ER (DISP) TABS 20MEQ] 90 tablet 1    Sig: TAKE 1 TABLET DAILY     Endocrinology:  Minerals - Potassium Supplementation Failed - 04/17/2023  1:21 AM      Failed - Cr in normal range and within 360 days    Creatinine  Date Value Ref Range Status  02/24/2023 1.05 (H) 0.44 - 1.00 mg/dL Final         Passed - K in normal range and within 360 days    Potassium  Date Value Ref Range Status  02/24/2023 3.6 3.5 - 5.1 mmol/L Final         Passed - Valid encounter within last 12 months    Recent Outpatient Visits           2 months ago Alzheimer disease (HCC)   [place] [facility] [place], Dorie Rank, NP   6 months ago Memory changes   [place] [facility] [place], [place] T, NP   6 months ago Stage 3a chronic kidney disease (HCC)   [place] [facility] [place], Corrie Dandy T, NP   1 year ago Stage 3a chronic kidney disease (HCC)   [place] [facility] [place], Dorie Rank, NP   1 year ago Stage 3a chronic kidney disease (HCC)   [place] [facility] [place], Dorie Rank, NP       Future Appointments             In 3 months [name], Dorie Rank, NP [place] [facility], [facility]

## 2023-05-10 ENCOUNTER — Telehealth: Payer: Self-pay | Admitting: Nurse Practitioner

## 2023-05-10 NOTE — Telephone Encounter (Unsigned)
Copied from CRM [id number]. Topic: General - Other >> May 10, 2023 12:06 PM Turkey B wrote:   Reason for CRM: stephen from [facility] called in states needs last office notes for pt for refills and they are faxing over a form for Dr Harvest Dark to sign that tey need back right awasy. Fx # is [phone]

## 2023-05-11 NOTE — Telephone Encounter (Signed)
Viviann Spare with [facility] request to refax the form [phone]  as they never recvd it from yesterday

## 2023-05-11 NOTE — Telephone Encounter (Signed)
Paperwork faxed back to [facility]  

## 2023-05-12 NOTE — Telephone Encounter (Signed)
Paperwork has been re-faxed to [facility]

## 2023-05-15 ENCOUNTER — Ambulatory Visit: Payer: Medicare Other | Admitting: Family Medicine

## 2023-06-13 ENCOUNTER — Telehealth: Payer: Self-pay | Admitting: Nurse Practitioner

## 2023-06-13 NOTE — Telephone Encounter (Signed)
Copied from CRM [id number]. Topic: General - Other >> Jun 13, 2023 10:11 AM Turkey B wrote: Reason for CRM: Thyra Breed caller from  [facility] called in about papers faxed today for immuno deficiency testing. Please call back if received.

## 2023-06-16 NOTE — Telephone Encounter (Signed)
Cynthia Hurst has made an additional phone call to follow up on confirmation   Please contact at [phone] when possible

## 2023-06-20 ENCOUNTER — Telehealth: Payer: Self-pay | Admitting: Nurse Practitioner

## 2023-06-20 NOTE — Telephone Encounter (Signed)
Copied from CRM [id number]. Topic: General - Other >> Jun 20, 2023  1:39 PM Carrielelia G wrote: Calling again,  Thyra Breed caller from [facility] calling about papers faxed today for immuno deficiency testing. Please call back if received

## 2023-06-20 NOTE — Telephone Encounter (Signed)
Spoke with patient's daughter and she stated that as far as she knew her mother did not ask for this, but will talk to patient about it and speak to pcp at up coming visit.

## 2023-06-21 ENCOUNTER — Ambulatory Visit: Payer: Self-pay

## 2023-06-21 NOTE — Telephone Encounter (Signed)
The patient's daughter has called to further discuss the patient's medications   The patient has recently been prescribed an alternative to their raloxifene (EVISTA) 60 MG tablet [[id number]] prescription   The patient's daughter is uncertain of the name of the alternative/substitute and needs a refill of the medication when possible   Please contact further when available   Chief Complaint: Daughter asking the name of replacement for Evista - Fosamax. Not sure pt. Received it from her mail order pharmacy. Will call back if pt. Does not have medication. Symptoms: n/a Frequency: n/a Pertinent Negatives: Patient denies  Disposition: [] ED /[] Urgent Care (no appt availability in office) / [] Appointment(In office/virtual)/ []  [place] [facility]/ [x] Home Care/ [] Refused Recommended Disposition /[] [facility]/ []  Follow-up with PCP Additional Notes: Answered question.  Reason for Disposition  Caller has medicine question only, adult not sick, AND triager answers question  Answer Assessment - Initial Assessment Questions 1. NAME of MEDICINE: "What medicine(s) are you calling about?"     Fosamax 2. QUESTION: "What is your question?" (e.g., double dose of medicine, side effect)     Is the new medication to replace Evista? 3. PRESCRIBER: "Who prescribed the medicine?" Reason: if prescribed by specialist, call should be referred to that group.     [name] 4. SYMPTOMS: "Do you have any symptoms?" If Yes, ask: "What symptoms are you having?"  "How bad are the symptoms (e.g., mild, moderate, severe)     N/a 5. PREGNANCY:  "Is there any chance that you are pregnant?" "When was your last menstrual period?"     No  Protocols used: Medication Question Call-A-AH

## 2023-06-22 NOTE — Telephone Encounter (Signed)
Cynthia Hurst with [employer] has called to check the status of this. Cynthia Hurst of information below and let Cynthia Hurst know that patient can discuss this at her next visit with Cynthia Hurst, however per Cynthia Hurst, he stated this testing would not be done at the office, he states this is done in home, that it is home based testing and per Cynthia Hurst, he has stated that patient is requesting this for pneumonia and other issues such as constipation, gas, diarrhea, etc. and per Cynthia Hurst, he stated her PCP was aware of these issues.   Cynthia Hurst #: [phone]

## 2023-06-23 ENCOUNTER — Other Ambulatory Visit: Payer: Self-pay | Admitting: Nurse Practitioner

## 2023-06-23 NOTE — Telephone Encounter (Signed)
Pt's daughter would like a 90 day Rx for the  alendronate (FOSAMAX) 70 MG tablet  sent to the local  [id number] - [place], [place] - [address] AT [facility] OF [address]   She states they have had no luck getting from [employer].

## 2023-06-26 ENCOUNTER — Ambulatory Visit: Payer: Medicare Other | Admitting: Adult Health

## 2023-06-26 ENCOUNTER — Encounter: Payer: Self-pay | Admitting: Adult Health

## 2023-06-26 VITALS — BP 122/67 | HR 67 | Ht 62.0 in | Wt 157.0 lb

## 2023-06-26 DIAGNOSIS — F0280 Dementia in other diseases classified elsewhere without behavioral disturbance: Secondary | ICD-10-CM

## 2023-06-26 DIAGNOSIS — G309 Alzheimer's disease, unspecified: Secondary | ICD-10-CM

## 2023-06-26 MED ORDER — ALENDRONATE SODIUM 70 MG PO TABS: 70.0000 mg | ORAL_TABLET | ORAL | 3 refills | Status: DC

## 2023-06-26 NOTE — Patient Instructions (Signed)
Continue Aricept 10mg  daily  Continue to do memory exercises and staying active as long as safety permits    At this time, you can return back to your primary doctor for ongoing monitoring. If you have any questions or concerns in the future, you can always give [place] a call to schedule a follow up visit

## 2023-06-26 NOTE — Progress Notes (Signed)
[facility]  PATIENT: Cynthia Hurst DOB: 26-May-1935  REFERRING CLINICIAN: Marjie Skiff, NP HISTORY FROM: self, son [place] REASON FOR VISIT: Alzheimer's dementia   HISTORICAL  CHIEF COMPLAINT:  Chief Complaint  Patient presents with   Follow-up    Rm 2, here with son Deniece Portela  Pt is here for follow up on memory. Son states she is staying pretty consistent, states pt repeats certain things at times but other than that has not notice any decline.     Follow-up visit:  Prior visit: 11/07/2022 with Dr. Delena Bali    Brief HPI:  Cynthia Hurst is a 88 y.o. female who was initially evaluated by Dr. Delena Bali on 11/07/2022 for concern of memory loss over the past year but more noticeable since 03/2022.  Placed on donepezil by PCP prior to coming to this office.  MRI brain 09/2022 showed volume loss most predominant in left medial temporal lobe and hippocampus as well as moderate-severe chronic small vessel ischemic disease.  At prior visit, recommended increasing donepezil to 10 mg daily. MOCA 12/30.    Interval history:  She is accompanied today by her son.  Reports she has been stable since prior visit without any significant decline.  Will repeat herself at times which is not new.  No behavioral concerns. Lives with her husband, ADLs majority of IADLs independently, supportive family who assist as needed.  Enjoyed working on puzzles and tries to keep active.  Continues on donepezil 10 mg daily, tolerating well. MOCA today 12/30.      REVIEW OF SYSTEMS: Full 14 system review of systems performed and negative with exception of: memory loss  ALLERGIES: No Known Allergies  HOME MEDICATIONS: Outpatient Medications Prior to Visit  Medication Sig Dispense Refill   alendronate (FOSAMAX) 70 MG tablet Take 1 tablet (70 mg total) by mouth every 7 (seven) days. Take with a full glass of water on an empty stomach. 4 tablet 3   allopurinol (ZYLOPRIM) 100 MG tablet Take 1  tablet (100 mg total) by mouth daily. 15 tablet 0   atenolol (TENORMIN) 100 MG tablet Take 1 tablet (100 mg total) by mouth daily. 90 tablet 4   cholecalciferol (VITAMIN D) 1000 UNITS tablet Take 1,000 Units by mouth daily.     cyanocobalamin 2000 MCG tablet Take 2,000 mcg by mouth daily.     Diclofenac Sodium 3 % GEL Apply 1 application topically 2 (two) times daily as needed (for back pain). 100 g 0   donepezil (ARICEPT) 10 MG tablet Take 1 tablet (10 mg total) by mouth at bedtime. 90 tablet 4   NIFEdipine (ADALAT CC) 60 MG 24 hr tablet Take 1 tablet (60 mg total) by mouth daily. 90 tablet 4   potassium chloride SA (KLOR-CON M20) 20 MEQ tablet TAKE 1 TABLET DAILY 90 tablet 1   No facility-administered medications prior to visit.    PAST MEDICAL HISTORY: Past Medical History:  Diagnosis Date   Chronic kidney disease    GERD (gastroesophageal reflux disease)    Gout    Hypertension    Hypopotassemia    Osteoarthritis    Osteoporosis    LUMBAR SPINE   Vitamin B 12 deficiency    Vitamin D deficiency     PAST SURGICAL HISTORY: Past Surgical History:  Procedure Laterality Date   ABDOMINAL HYSTERECTOMY     PARTIAL   EYE SURGERY Left 05/2018   cataract surgery    JOINT REPLACEMENT Bilateral 2010,2011   KNEE  FAMILY HISTORY: Family History  Problem Relation Age of Onset   Heart disease Mother    Cancer Father    Alzheimer's disease Father    Breast cancer Neg Hx     SOCIAL HISTORY: Social History   Socioeconomic History   Marital status: Married    Spouse name: Not on file   Number of children: Not on file   Years of education: Not on file   Highest education level: High school graduate  Occupational History   Not on file  Tobacco Use   Smoking status: Never   Smokeless tobacco: Never  Vaping Use   Vaping status: Never Used  Substance and Sexual Activity   Alcohol use: No   Drug use: No   Sexual activity: Yes  Other Topics Concern   Not on file  Social  History Narrative   Not on file   Social Determinants of Health   Financial Resource Strain: Low Risk  (08/04/2022)   Overall Financial Resource Strain (CARDIA)    Difficulty of Paying Living Expenses: Not hard at all  Food Insecurity: No Food Insecurity (08/04/2022)   Hunger Vital Sign    Worried About Running Out of Food in the Last Year: Never true    Ran Out of Food in the Last Year: Never true  Transportation Needs: No Transportation Needs (08/04/2022)   PRAPARE - Administrator, Civil Service (Medical): No    Lack of Transportation (Non-Medical): No  Physical Activity: Inactive (08/04/2022)   Exercise Vital Sign    Days of Exercise per Week: 0 days    Minutes of Exercise per Session: 0 min  Stress: No Stress Concern Present (08/04/2022)   [employer] of Occupational Health - Occupational Stress Questionnaire    Feeling of Stress : Not at all  Social Connections: Moderately Integrated (08/04/2022)   Social Connection and Isolation Panel [NHANES]    Frequency of Communication with Friends and Family: More than three times a week    Frequency of Social Gatherings with Friends and Family: More than three times a week    Attends Religious Services: More than 4 times per year    Active Member of [employer] or Organizations: No    Attends Banker Meetings: Never    Marital Status: Married  Catering manager Violence: Not At Risk (08/04/2022)   Humiliation, Afraid, Rape, and Kick questionnaire    Fear of Current or Ex-Partner: No    Emotionally Abused: No    Physically Abused: No    Sexually Abused: No     PHYSICAL EXAM  GENERAL EXAM/CONSTITUTIONAL: Vitals:  Vitals:   06/26/23 1435  BP: 122/67  Pulse: 67  Weight: 157 lb (71.2 kg)  Height: 5\' 2"  (1.575 m)    Body mass index is 28.72 kg/m. Wt Readings from Last 3 Encounters:  06/26/23 157 lb (71.2 kg)  03/02/23 157 lb 11.2 oz (71.5 kg)  01/19/23 152 lb 11.2 oz (69.3 kg)   NEUROLOGIC: MENTAL  STATUS:      06/26/2023    2:40 PM 11/07/2022   11:16 AM  Montreal Cognitive Assessment   Visuospatial/ Executive (0/5) 2 2  Naming (0/3) 0 0  Attention: Read list of digits (0/2) 1 2  Attention: Read list of letters (0/1) 1 1  Attention: Serial 7 subtraction starting at 100 (0/3) 0 0  Language: Repeat phrase (0/2) 2 2  Language : Fluency (0/1) 1 1  Abstraction (0/2) 2 1  Delayed Recall (0/5) 0  0  Orientation (0/6) 4 3  Total 13 12     CRANIAL NERVE:  2nd, 3rd, 4th, 6th - pupils equal and reactive to light, visual fields full to confrontation, extraocular muscles intact, no nystagmus 5th - facial sensation symmetric 7th - facial strength symmetric 8th - hearing intact 9th - palate elevates symmetrically, uvula midline 11th - shoulder shrug symmetric 12th - tongue protrusion midline  MOTOR:  normal bulk and tone, no cogwheeling, full strength in the BUE, BLE  SENSORY:  normal and symmetric to light touch all 4 extremities  COORDINATION:  finger-nose-finger, fine finger movements normal, no tremor  REFLEXES:  deep tendon reflexes present and symmetric  GAIT/STATION:  normal     DIAGNOSTIC DATA (LABS, IMAGING, TESTING) - I reviewed patient records, labs, notes, testing and imaging myself where available.  Lab Results  Component Value Date   WBC 5.3 02/24/2023   HGB 12.0 02/24/2023   HCT 36.2 02/24/2023   MCV 93.1 02/24/2023   PLT 148 (L) 02/24/2023      Component Value Date/Time   NA 138 02/24/2023 1030   NA 142 01/19/2023 0945   K 3.6 02/24/2023 1030   CL 106 02/24/2023 1030   CO2 25 02/24/2023 1030   GLUCOSE 165 (H) 02/24/2023 1030   BUN 14 02/24/2023 1030   BUN 12 01/19/2023 0945   CREATININE 1.05 (H) 02/24/2023 1030   CALCIUM 9.1 02/24/2023 1030   PROT 6.9 02/24/2023 1030   PROT 6.6 01/19/2023 0945   ALBUMIN 3.6 02/24/2023 1030   ALBUMIN 4.1 01/19/2023 0945   AST 29 02/24/2023 1030   ALT 9 02/24/2023 1030   ALKPHOS 61 02/24/2023 1030    BILITOT 0.8 02/24/2023 1030   GFRNONAA 52 (L) 02/24/2023 1030   GFRAA 50 (L) 08/12/2020 1309   Lab Results  Component Value Date   CHOL 178 09/27/2022   HDL 73 09/27/2022   LDLCALC 83 09/27/2022   TRIG 128 09/27/2022   CHOLHDL 2.5 09/05/2018   Lab Results  Component Value Date   HGBA1C 5.5 04/14/2021   Lab Results  Component Value Date   VITAMINB12 >2000 (H) 09/27/2022   Lab Results  Component Value Date   TSH 3.020 01/19/2023      ASSESSMENT AND PLAN  88 y.o. year old female with a history of HTN, CKD3, MGUS who presents for evaluation of memory loss. MOCA score today is 12/20. MRI brain with atrophy most prominent in the left medial temporal lobe and hippocampus as well as moderate-severe chronic small vessel ischemic disease. Clinical picture is most consistent with Alzheimer's dementia, though she may also have a component of vascular dementia in the setting of moderate-severe microvascular changes. Memory has been stable since prior visit.    1. Alzheimer disease (HCC)      PLAN: -Continue donepezil to 10 mg daily  -discussed consideration of adding Namenda but as cognition nonprogressive, will hold off for now - Limit driving to short distances and familiar places -continue to participate in cognitively challenging activities and keeping physically active as long as safety permits -advised can return back to PCP at this time as no further recommendations from our standpoint    Return if symptoms worsen or fail to improve.    I spent 25 minutes of face-to-face and non-face-to-face time with patient and son.  This included previsit chart review, lab review, study review, order entry, electronic health record documentation, patient education and discussion regarding above diagnoses and treatment plan and answered all other  questions to patient's satisfaction  Ihor Austin, [facility]  [facility] [address] [place], [place]  [address]  Phone [phone] Fax [phone] Note: This document was prepared with digital dictation and possible smart phrase technology. Any transcriptional errors that result from this process are unintentional.

## 2023-06-26 NOTE — Telephone Encounter (Signed)
Requested Prescriptions  Pending Prescriptions Disp Refills   alendronate (FOSAMAX) 70 MG tablet 4 tablet 3    Sig: Take 1 tablet (70 mg total) by mouth every 7 (seven) days. Take with a full glass of water on an empty stomach.     Endocrinology:  Bisphosphonates Failed - 06/23/2023  3:49 PM      Failed - Cr in normal range and within 360 days    Creatinine  Date Value Ref Range Status  02/24/2023 1.05 (H) 0.44 - 1.00 mg/dL Final         Failed - Phosphate in normal range and within 360 days    No results found for: "PHOS"       Passed - Ca in normal range and within 360 days    Calcium  Date Value Ref Range Status  02/24/2023 9.1 8.9 - 10.3 mg/dL Final         Passed - Vitamin D in normal range and within 360 days    Vit D, 25-Hydroxy  Date Value Ref Range Status  01/19/2023 36.8 30.0 - 100.0 ng/mL Final    Comment:    Vitamin D deficiency has been defined by the Institute of Medicine and an Endocrine Society practice guideline as a level of serum 25-OH vitamin D less than 20 ng/mL (1,2). The Endocrine Society went on to further define vitamin D insufficiency as a level between 21 and 29 ng/mL (2). 1. IOM (Institute of Medicine). 2010. Dietary reference    intakes for calcium and D. [place]: The    [employer]. 2. Holick MF, Binkley , Bischoff-Ferrari HA, et al.    Evaluation, treatment, and prevention of vitamin D    deficiency: an Endocrine Society clinical practice    guideline. JCEM. 2011 Jul; 96(7):1911-30.          Passed - Mg Level in normal range and within 360 days    Magnesium  Date Value Ref Range Status  09/27/2022 1.9 1.6 - 2.3 mg/dL Final         Passed - eGFR is 30 or above and within 360 days    GFR calc Af Amer  Date Value Ref Range Status  08/12/2020 50 (L) >60 mL/min Final   GFR, Estimated  Date Value Ref Range Status  02/24/2023 52 (L) >60 mL/min Final    Comment:    (NOTE) Calculated using the CKD-EPI Creatinine  Equation (2021)    eGFR  Date Value Ref Range Status  01/19/2023 59 (L) >59 mL/min/1.73 Final         Passed - Valid encounter within last 12 months    Recent Outpatient Visits           5 months ago Alzheimer disease (HCC)   [place] [facility] [place], Dorie Rank, NP   8 months ago Memory changes   [place] [facility] [place], [place] T, NP   9 months ago Stage 3a chronic kidney disease (HCC)   [place] [facility] [place], Corrie Dandy T, NP   1 year ago Stage 3a chronic kidney disease (HCC)   [place] [facility] [place], Corrie Dandy T, NP   1 year ago Stage 3a chronic kidney disease (HCC)   [place] [facility] [place], Dorie Rank, NP       Future Appointments             In 3 weeks [name], Dorie Rank, NP [place] [facility],  [facility]            Passed - Bone Mineral Density or Dexa Scan completed in the last 2 years

## 2023-07-17 NOTE — Telephone Encounter (Signed)
Pt daughter Geraldo Docker states that her mom keeps getting phone calls from [place] regarding the immunity test. Per Misty Stanley her mother did not request this test and is not wanting this done.

## 2023-07-20 ENCOUNTER — Ambulatory Visit: Payer: Self-pay

## 2023-07-20 ENCOUNTER — Ambulatory Visit: Payer: Medicare Other | Admitting: Nurse Practitioner

## 2023-07-20 DIAGNOSIS — M4802 Spinal stenosis, cervical region: Secondary | ICD-10-CM | POA: Diagnosis not present

## 2023-07-20 DIAGNOSIS — G309 Alzheimer's disease, unspecified: Secondary | ICD-10-CM | POA: Diagnosis present

## 2023-07-20 DIAGNOSIS — N183 Chronic kidney disease, stage 3 unspecified: Secondary | ICD-10-CM | POA: Diagnosis present

## 2023-07-20 DIAGNOSIS — M50221 Other cervical disc displacement at C4-C5 level: Secondary | ICD-10-CM | POA: Diagnosis not present

## 2023-07-20 DIAGNOSIS — M4312 Spondylolisthesis, cervical region: Secondary | ICD-10-CM | POA: Diagnosis not present

## 2023-07-20 DIAGNOSIS — F0280 Dementia in other diseases classified elsewhere without behavioral disturbance: Secondary | ICD-10-CM | POA: Diagnosis present

## 2023-07-20 DIAGNOSIS — F0390 Unspecified dementia without behavioral disturbance: Secondary | ICD-10-CM | POA: Diagnosis not present

## 2023-07-20 DIAGNOSIS — S0990XA Unspecified injury of head, initial encounter: Secondary | ICD-10-CM | POA: Diagnosis not present

## 2023-07-20 DIAGNOSIS — I6529 Occlusion and stenosis of unspecified carotid artery: Secondary | ICD-10-CM | POA: Diagnosis not present

## 2023-07-20 DIAGNOSIS — R001 Bradycardia, unspecified: Secondary | ICD-10-CM | POA: Diagnosis present

## 2023-07-20 DIAGNOSIS — M25532 Pain in left wrist: Secondary | ICD-10-CM | POA: Diagnosis not present

## 2023-07-20 DIAGNOSIS — Z23 Encounter for immunization: Secondary | ICD-10-CM | POA: Diagnosis not present

## 2023-07-20 DIAGNOSIS — S065XAA Traumatic subdural hemorrhage with loss of consciousness status unknown, initial encounter: Secondary | ICD-10-CM | POA: Diagnosis present

## 2023-07-20 DIAGNOSIS — S01111A Laceration without foreign body of right eyelid and periocular area, initial encounter: Secondary | ICD-10-CM | POA: Diagnosis present

## 2023-07-20 DIAGNOSIS — R55 Syncope and collapse: Secondary | ICD-10-CM | POA: Diagnosis not present

## 2023-07-20 DIAGNOSIS — M47812 Spondylosis without myelopathy or radiculopathy, cervical region: Secondary | ICD-10-CM | POA: Diagnosis not present

## 2023-07-20 DIAGNOSIS — M109 Gout, unspecified: Secondary | ICD-10-CM | POA: Diagnosis not present

## 2023-07-20 DIAGNOSIS — W19XXXA Unspecified fall, initial encounter: Secondary | ICD-10-CM | POA: Diagnosis not present

## 2023-07-20 DIAGNOSIS — I071 Rheumatic tricuspid insufficiency: Secondary | ICD-10-CM | POA: Diagnosis not present

## 2023-07-20 DIAGNOSIS — I129 Hypertensive chronic kidney disease with stage 1 through stage 4 chronic kidney disease, or unspecified chronic kidney disease: Secondary | ICD-10-CM | POA: Diagnosis present

## 2023-07-20 DIAGNOSIS — R079 Chest pain, unspecified: Secondary | ICD-10-CM | POA: Diagnosis not present

## 2023-07-20 DIAGNOSIS — J811 Chronic pulmonary edema: Secondary | ICD-10-CM | POA: Diagnosis not present

## 2023-07-20 DIAGNOSIS — S0181XA Laceration without foreign body of other part of head, initial encounter: Secondary | ICD-10-CM | POA: Diagnosis not present

## 2023-07-20 DIAGNOSIS — Z79899 Other long term (current) drug therapy: Secondary | ICD-10-CM | POA: Diagnosis not present

## 2023-07-20 DIAGNOSIS — I491 Atrial premature depolarization: Secondary | ICD-10-CM | POA: Diagnosis not present

## 2023-07-20 DIAGNOSIS — R412 Retrograde amnesia: Secondary | ICD-10-CM | POA: Diagnosis present

## 2023-07-20 DIAGNOSIS — I440 Atrioventricular block, first degree: Secondary | ICD-10-CM | POA: Diagnosis present

## 2023-07-20 DIAGNOSIS — S065X0A Traumatic subdural hemorrhage without loss of consciousness, initial encounter: Secondary | ICD-10-CM | POA: Diagnosis not present

## 2023-07-20 DIAGNOSIS — R011 Cardiac murmur, unspecified: Secondary | ICD-10-CM | POA: Diagnosis present

## 2023-07-20 DIAGNOSIS — R252 Cramp and spasm: Secondary | ICD-10-CM | POA: Diagnosis present

## 2023-07-20 DIAGNOSIS — M8580 Other specified disorders of bone density and structure, unspecified site: Secondary | ICD-10-CM | POA: Diagnosis present

## 2023-07-20 DIAGNOSIS — I10 Essential (primary) hypertension: Secondary | ICD-10-CM | POA: Diagnosis not present

## 2023-07-20 DIAGNOSIS — I371 Nonrheumatic pulmonary valve insufficiency: Secondary | ICD-10-CM | POA: Diagnosis not present

## 2023-07-20 NOTE — Telephone Encounter (Signed)
FYI to provider

## 2023-07-20 NOTE — Telephone Encounter (Signed)
  Chief Complaint: Fall - cut above eye brow right side. Mild swelling.  Symptoms: above Frequency: this morning Pertinent Negatives: Patient denies dizziness, unequal pupils Disposition: [] ED /[x] Urgent Care (no appt availability in office) / [] Appointment(In office/virtual)/ []  [place] [facility]/ [] Home Care/ [] Refused Recommended Disposition /[] [facility]/ []  Follow-up with PCP Additional Notes: Spoke with daughter Misty Stanley, pt lost her balance this morning and fell. She hit her head and now has a 1.5 inch X 1/4 inch cut. Cut continues to have some bleeding. Unknown last tetanus. Daughter will take pt to UC for evaluation and care.    Reason for Disposition  [1] Last tetanus shot > 5 years ago AND [2] DIRTY cut or scrape  Answer Assessment - Initial Assessment Questions 1. MECHANISM: "How did the fall happen?"     Took trash out  and lost her balance 2. DOMESTIC VIOLENCE AND ELDER ABUSE SCREENING: "Did you fall because someone pushed you or tried to hurt you?" If Yes, ask: "Are you safe now?"     na 3. ONSET: "When did the fall happen?" (e.g., minutes, hours, or days ago)     This morning 4. LOCATION: "What part of the body hit the ground?" (e.g., back, buttocks, head, hips, knees, hands, head, stomach)     Fell forward and head hit something on the porch 5. INJURY: "Did you hurt (injure) yourself when you fell?" If Yes, ask: "What did you injure? Tell me more about this?" (e.g., body area; type of injury; pain severity)"     Right side of head 6. PAIN: "Is there any pain?" If Yes, ask: "How bad is the pain?" (e.g., Scale 1-10; or mild,  moderate, severe)   - NONE (0): No pain   - MILD (1-3): Doesn't interfere with normal activities    - MODERATE (4-7): Interferes with normal activities or awakens from sleep    - SEVERE (8-10): Excruciating pain, unable to do any normal activities      No unless you touch it 7. SIZE: For cuts, bruises, or swelling, ask: "How large is  it?" (e.g., inches or centimeters)      1.5 inches by 1/4 inch 9. OTHER SYMPTOMS: "Do you have any other symptoms?" (e.g., dizziness, fever, weakness; new onset or worsening).      no 10. CAUSE: "What do you think caused the fall (or falling)?" (e.g., tripped, dizzy spell)       Lost her balance  Protocols used: Falls and [facility]

## 2023-07-20 NOTE — Telephone Encounter (Signed)
Noted  

## 2023-07-21 DIAGNOSIS — G309 Alzheimer's disease, unspecified: Secondary | ICD-10-CM | POA: Diagnosis present

## 2023-07-21 DIAGNOSIS — I129 Hypertensive chronic kidney disease with stage 1 through stage 4 chronic kidney disease, or unspecified chronic kidney disease: Secondary | ICD-10-CM | POA: Diagnosis present

## 2023-07-21 DIAGNOSIS — R55 Syncope and collapse: Secondary | ICD-10-CM | POA: Diagnosis present

## 2023-07-21 DIAGNOSIS — I10 Essential (primary) hypertension: Secondary | ICD-10-CM | POA: Diagnosis not present

## 2023-07-21 DIAGNOSIS — S065XAA Traumatic subdural hemorrhage with loss of consciousness status unknown, initial encounter: Secondary | ICD-10-CM | POA: Diagnosis present

## 2023-07-21 DIAGNOSIS — Z23 Encounter for immunization: Secondary | ICD-10-CM | POA: Diagnosis not present

## 2023-07-21 DIAGNOSIS — F0390 Unspecified dementia without behavioral disturbance: Secondary | ICD-10-CM | POA: Diagnosis not present

## 2023-07-21 DIAGNOSIS — M109 Gout, unspecified: Secondary | ICD-10-CM | POA: Diagnosis present

## 2023-07-21 DIAGNOSIS — R252 Cramp and spasm: Secondary | ICD-10-CM | POA: Diagnosis present

## 2023-07-21 DIAGNOSIS — R412 Retrograde amnesia: Secondary | ICD-10-CM | POA: Diagnosis present

## 2023-07-21 DIAGNOSIS — W19XXXA Unspecified fall, initial encounter: Secondary | ICD-10-CM | POA: Diagnosis not present

## 2023-07-21 DIAGNOSIS — I371 Nonrheumatic pulmonary valve insufficiency: Secondary | ICD-10-CM | POA: Diagnosis not present

## 2023-07-21 DIAGNOSIS — I440 Atrioventricular block, first degree: Secondary | ICD-10-CM | POA: Diagnosis present

## 2023-07-21 DIAGNOSIS — S0181XA Laceration without foreign body of other part of head, initial encounter: Secondary | ICD-10-CM | POA: Diagnosis present

## 2023-07-21 DIAGNOSIS — M8580 Other specified disorders of bone density and structure, unspecified site: Secondary | ICD-10-CM | POA: Diagnosis present

## 2023-07-21 DIAGNOSIS — F0280 Dementia in other diseases classified elsewhere without behavioral disturbance: Secondary | ICD-10-CM | POA: Diagnosis present

## 2023-07-21 DIAGNOSIS — R001 Bradycardia, unspecified: Secondary | ICD-10-CM | POA: Diagnosis present

## 2023-07-21 DIAGNOSIS — S065X0A Traumatic subdural hemorrhage without loss of consciousness, initial encounter: Secondary | ICD-10-CM | POA: Diagnosis not present

## 2023-07-21 DIAGNOSIS — S01111A Laceration without foreign body of right eyelid and periocular area, initial encounter: Secondary | ICD-10-CM | POA: Diagnosis present

## 2023-07-21 DIAGNOSIS — Z79899 Other long term (current) drug therapy: Secondary | ICD-10-CM | POA: Diagnosis not present

## 2023-07-21 DIAGNOSIS — I071 Rheumatic tricuspid insufficiency: Secondary | ICD-10-CM | POA: Diagnosis not present

## 2023-07-21 DIAGNOSIS — N183 Chronic kidney disease, stage 3 unspecified: Secondary | ICD-10-CM | POA: Diagnosis present

## 2023-07-21 DIAGNOSIS — R011 Cardiac murmur, unspecified: Secondary | ICD-10-CM | POA: Diagnosis present

## 2023-07-22 DIAGNOSIS — Z8679 Personal history of other diseases of the circulatory system: Secondary | ICD-10-CM | POA: Insufficient documentation

## 2023-07-22 DIAGNOSIS — S065XAA Traumatic subdural hemorrhage with loss of consciousness status unknown, initial encounter: Secondary | ICD-10-CM | POA: Insufficient documentation

## 2023-07-22 NOTE — Patient Instructions (Signed)
 Dementia Caregiver Guide Dementia is a condition that affects the way the brain works. It often affects thinking and memory. A person with dementia may: Forget things. Have trouble talking or responding to your questions. Have trouble paying attention. Have trouble thinking clearly and making good decisions. Get lost or wander away from home or other places. Have big changes in their mood or emotions. They may: Feel very worried, nervous, or depressed. Have angry outbursts. Be suspicious or accuse you of things. Have childlike behavior and language. Taking care of someone with dementia can be a challenge. The tips below can help you care for the person. How to help manage lifestyle changes Dementia usually gets worse slowly over time. In the early stages, people with dementia can stay safe and take care of themselves with some help. In later stages, they need help with daily tasks like getting dressed, grooming, and going to the bathroom. Communicating When the person is talking and seems frustrated, make eye contact and hold the person's hand. Ask questions that can be answered with a yes or no. Use simple words and a calm voice. Only give one direction at a time. Limit choices for the person. Too many choices can be stressful. Avoid correcting the person in a negative way. If the person can't find the right words, gently try to help. Preventing injury  Keep floors clear. Remove rugs, magazine racks, and floor lamps. Keep hallways well lit, especially at night. Put a handrail and nonslip mat in the bathtub or shower. Put childproof locks on cabinets that have dangerous items in them. These items include medicine, alcohol, guns, cleaning products, and sharp tools. For doors to the outside, put locks where the person can't see or reach them. This helps keep the person from going out of the house and getting lost. Be ready for emergencies. Keep a list of emergency phone numbers and  addresses close by. Remove car keys and lock garage doors so the person doesn't try to drive. Have the person wear a bracelet that tracks where they are and shows that they're a person with memory loss. This should be worn at all times for safety. Helping with daily life  Keep the person on track with their daily routine. Try to identify areas where the person may need help. Be supportive, patient, calm, and encouraging. Gently remind the person that adjusting to changes takes time. Help with the tasks that the person has asked for help with. Keep the person involved in daily tasks and decisions as much as you can. Encourage conversation, but try not to get frustrated if the person struggles to find words or doesn't seem to appreciate your help. Other tips Think about any safety risks and take steps to avoid them. Keep things organized: Organize medicines in a pill box for each day of the week. Keep a calendar in a central place. Use it to remind the person of health care visits or other activities. Create a plan to handle any legal or financial matters. Get help from a professional if needed. Help make sure the person: Takes medicines only as told by their health care providers. Eats regular, healthy meals. They should also drink plenty of fluids. Goes to all scheduled health care appointments. Gets regular sleep. Taking care of yourself Being a caregiver for someone with dementia can be hard. You may feel stressed and have many other emotions. It's important to also take care of yourself. Here are some tips: Find out about services that  can provide short-term care for the person. This is called respite care. It can allow you to take a break when you need one. Find healthy ways to deal with stress. Some ways include: Spending time with other people. Exercising. Meditating or doing deep breathing exercises. Take care of your own health by: Getting enough sleep. Eating healthy  foods. Getting regular exercise. Join a support group with others who are caregivers. These groups can help you: Learn other ways to deal with stress. Share experiences with others. Get emotional comfort and support. Learn about caregiving as the disease gets worse. Find resources in your community. Where to find support: Many people and organizations offer support. These include: Support groups for people with dementia. Support groups for caregivers. Counselors or therapists. Home health care services. Adult day care centers. Where to find more information Alzheimer's Association: [employer] Family Caregiver Alliance: caregiver.org Alzheimer's Foundation of [place]: alzfdn.org Contact a health care provider if: The person's health is quickly getting worse. You're no longer able to care for the person. Caring for the person is affecting your physical and emotional health. You're feeling worried, nervous, or depressed about caring for the person. Get help right away if: You feel like the person may hurt themselves or others. The person has talked about taking their own life. These symptoms may be an emergency. Take one of these steps right away: Go to your nearest emergency room. Call 911. Call the National Suicide Prevention Lifeline at 401 856 7434 or 988. Text the Crisis Text Line at 320-444-2567. This information is not intended to replace advice given to you by your health care provider. Make sure you discuss any questions you have with your health care provider. Document Revised: 03/10/2023 Document Reviewed: 03/10/2023 Elsevier Patient Education  2024 [employer].

## 2023-07-24 ENCOUNTER — Ambulatory Visit (INDEPENDENT_AMBULATORY_CARE_PROVIDER_SITE_OTHER): Payer: Medicare Other | Admitting: Nurse Practitioner

## 2023-07-24 ENCOUNTER — Encounter: Payer: Self-pay | Admitting: Nurse Practitioner

## 2023-07-24 ENCOUNTER — Other Ambulatory Visit: Payer: Self-pay | Admitting: Nurse Practitioner

## 2023-07-24 VITALS — BP 119/61 | HR 92 | Temp 98.1°F | Ht 62.0 in | Wt 158.2 lb

## 2023-07-24 DIAGNOSIS — F0280 Dementia in other diseases classified elsewhere without behavioral disturbance: Secondary | ICD-10-CM | POA: Diagnosis not present

## 2023-07-24 DIAGNOSIS — G8929 Other chronic pain: Secondary | ICD-10-CM | POA: Diagnosis not present

## 2023-07-24 DIAGNOSIS — I129 Hypertensive chronic kidney disease with stage 1 through stage 4 chronic kidney disease, or unspecified chronic kidney disease: Secondary | ICD-10-CM | POA: Diagnosis not present

## 2023-07-24 DIAGNOSIS — S065X0D Traumatic subdural hemorrhage without loss of consciousness, subsequent encounter: Secondary | ICD-10-CM | POA: Diagnosis not present

## 2023-07-24 DIAGNOSIS — K219 Gastro-esophageal reflux disease without esophagitis: Secondary | ICD-10-CM | POA: Diagnosis not present

## 2023-07-24 DIAGNOSIS — Z9181 History of falling: Secondary | ICD-10-CM | POA: Diagnosis not present

## 2023-07-24 DIAGNOSIS — G309 Alzheimer's disease, unspecified: Secondary | ICD-10-CM

## 2023-07-24 DIAGNOSIS — M109 Gout, unspecified: Secondary | ICD-10-CM | POA: Diagnosis not present

## 2023-07-24 DIAGNOSIS — M810 Age-related osteoporosis without current pathological fracture: Secondary | ICD-10-CM

## 2023-07-24 DIAGNOSIS — M79605 Pain in left leg: Secondary | ICD-10-CM | POA: Diagnosis not present

## 2023-07-24 DIAGNOSIS — D472 Monoclonal gammopathy: Secondary | ICD-10-CM

## 2023-07-24 DIAGNOSIS — S065XAA Traumatic subdural hemorrhage with loss of consciousness status unknown, initial encounter: Secondary | ICD-10-CM

## 2023-07-24 DIAGNOSIS — E785 Hyperlipidemia, unspecified: Secondary | ICD-10-CM | POA: Diagnosis not present

## 2023-07-24 DIAGNOSIS — M419 Scoliosis, unspecified: Secondary | ICD-10-CM | POA: Diagnosis not present

## 2023-07-24 DIAGNOSIS — E538 Deficiency of other specified B group vitamins: Secondary | ICD-10-CM | POA: Diagnosis not present

## 2023-07-24 DIAGNOSIS — M79604 Pain in right leg: Secondary | ICD-10-CM | POA: Diagnosis not present

## 2023-07-24 DIAGNOSIS — N1831 Chronic kidney disease, stage 3a: Secondary | ICD-10-CM

## 2023-07-24 DIAGNOSIS — M40209 Unspecified kyphosis, site unspecified: Secondary | ICD-10-CM | POA: Diagnosis not present

## 2023-07-24 DIAGNOSIS — M545 Low back pain, unspecified: Secondary | ICD-10-CM | POA: Diagnosis not present

## 2023-07-24 DIAGNOSIS — J309 Allergic rhinitis, unspecified: Secondary | ICD-10-CM | POA: Diagnosis not present

## 2023-07-24 MED ORDER — ENSURE HIGH PROTEIN PO LIQD: 3.0000 | Freq: Every day | ORAL | 4 refills | Status: DC

## 2023-07-24 NOTE — Assessment & Plan Note (Signed)
After fall on 07/20/23.  Overall reports no red flag symptoms today.  Monitor closely and avoid any ASA at this time.  Will get home health PT involved to work on fall prevention in home.

## 2023-07-24 NOTE — Assessment & Plan Note (Addendum)
Ongoing.  Will continue Evista at this time and plan on repeat DEXA in 2 years, 2025. One recent fall with no fractures, but subdural hematoma present.  Continue supplements.

## 2023-07-24 NOTE — Assessment & Plan Note (Signed)
Per [facility] to obtain doppler u/s in two weeks to check for DVT due to patient leg pain.  Will order these to be scheduled then and determine next steps after results return.

## 2023-07-24 NOTE — Assessment & Plan Note (Signed)
Chronic with CKD 3a.  Monitor BMP/CMP every 6 months and refer to nephrology as needed if worsening CKD.  Renal dose medications as needed.

## 2023-07-24 NOTE — Assessment & Plan Note (Signed)
Chronic, stable.  BP below goal in office today for age on recheck.  Continue current medication regimen and adjust as needed -- could consider reduction of medication in future if BP remains stable.  LABS: up to date.  Renal dose medications as needed.  Recommend she continue to monitor BP at home at least a few days a week and document + focus on DASH diet.

## 2023-07-24 NOTE — Assessment & Plan Note (Signed)
Chronic, ongoing.  Continue collaboration with oncology, recent note reviewed. 

## 2023-07-24 NOTE — Progress Notes (Signed)
BP 119/61   Pulse 92   Temp 98.1 F (36.7 C) (Oral)   Ht 5\' 2"  (1.575 m)   Wt 158 lb 3.2 oz (71.8 kg)   LMP  (LMP Unknown)   SpO2 96%   BMI 28.94 kg/m    Subjective:    Patient ID: Cynthia Hurst, female    DOB: October 29, 1935, 88 y.o.   MRN: [id number]  HPI: Cynthia Hurst is a 88 y.o. female  Chief Complaint  Patient presents with   Dementia   Hyperlipidemia   Hypertension   Osteoporosis   Fall    Pt's daughter states that the patient had a fall last Thursday, went to [facility] and was admitted for 2 days. States she was admitted for observation with a small subdural hematoma and for monitoring her heart. States she is currently wearing the Zio monitor.    Daughter present at bedside with her today.  OSTEOPENIA Previous osteopenia and recent DEXA 06/13/22 noted osteoporosis.  Has been on Evista for years.  The BMD measured at [facility]arm [facility] 33% is 0.600 g/cm2 with a T-score of -3.2.   Had recent fall on 07/20/23 for which she was admitted observation to [facility] for  2 days and noted to have subdural hematoma (4 cm thick right cerebral).  They held her Atenolol and placed a Zio monitor to wear home.  Echo performed noting EF >70%.  She reports she was outside and started coming in and fell.  Had not been outside for long period.  She does not recall dizziness, SOB, or CP at the time.  Continues to be sore in the xyphoid/center chest area -- not taking anything for pain. Fall was not witnessed -- it was during rain recently.  When her daughter got there Cynthia Hurst was sitting on side of the couch and told her she fell this morning.  Had laceration to anterior right occiput.  Has stitches to area and they told them to leave for 2 weeks and then if they do not fall out she is to come to PCP.  They also daughter to wait a week or two -- then do ultrasound of both legs to rule out clots.   Past osteoporosis medications/treatments: as above Adequate calcium & vitamin D: yes Weight bearing  exercises: yes   MGUS: Saw Dr. Cathie Hoops last on 03/02/23 -- no changes made, continue annual visits.  Had renal ultrasound on 02/14/2020 with normal findings, right-sided renal cysts measuring 1.8 cm and questionable mild bladder wall thickening. Denies any B symptoms at this time.    DEMENTIA + HTN She saw neurology 06/26/23, Alzheimer's Dementia.  Continues on Aricept 10 MG at bedtime.  Continues to live with her husband, her children limit their driving.  Her family is trying to make sure she eats, they try to encourage her to eat better.  Ensure started in hospital and they recommended to continue.  They have Meals on Wheels, but Austine is picky and at times does not like what they bring her. No incontinence issues present.  Occasionally missing medication doses.     Her daughter reports some family history in parents of dementia.   Satisfied with current treatment? yes Duration of hypertension: chronic BP monitoring frequency: monthly BP range: unsure BP medication side effects: no Aspirin: no Recent stressors: no Recurrent headaches: no Visual changes: no Palpitations: no Dyspnea: no Chest pain: no Lower extremity edema: no Dizzy/lightheaded: no     09/27/2022   10:01 AM  MMSE -  Mini Mental State Exam  Orientation to time 1  Orientation to Place 5  Registration 3  Attention/ Calculation 4  Recall 0  Language- name 2 objects 2  Language- repeat 1  Language- follow 3 step command 3  Language- read & follow direction 1  Write a sentence 1  Copy design 1  Total score 22       07/24/2023   11:49 AM 08/04/2022   12:37 PM 08/02/2021   11:22 AM 07/27/2020   11:25 AM 07/03/2019    8:11 AM  6CIT Screen  What Year? 4 points 4 points 0 points 0 points 0 points  What month? 0 points 0 points 0 points 0 points 0 points  What time? 0 points 0 points 0 points 0 points 0 points  Count back from 20 0 points 0 points 0 points 0 points 0 points  Months in reverse 4 points 2 points 2 points 4  points 0 points  Repeat phrase 8 points 10 points 2 points 0 points 0 points  Total Score 16 points 16 points 4 points 4 points 0 points    CHRONIC KIDNEY DISEASE Recent labs continue to show stable CKD 3a.   CKD status: controlled Medications renally dose: yes Previous renal evaluation: no Pneumovax: Up To Date Influenza Vaccine:  Up To Date  Relevant past medical, surgical, family and social history reviewed and updated as indicated. Interim medical history since our last visit reviewed. Allergies and medications reviewed and updated.  Review of Systems  Constitutional:  Negative for activity change, appetite change, diaphoresis, fatigue and fever.  Respiratory:  Negative for cough, chest tightness and shortness of breath.   Cardiovascular:  Negative for chest pain, palpitations and leg swelling.  Gastrointestinal: Negative.   Musculoskeletal:  Negative for back pain.  Neurological: Negative.   Psychiatric/Behavioral: Negative.      Per HPI unless specifically indicated above     Objective:    BP 119/61   Pulse 92   Temp 98.1 F (36.7 C) (Oral)   Ht 5\' 2"  (1.575 m)   Wt 158 lb 3.2 oz (71.8 kg)   LMP  (LMP Unknown)   SpO2 96%   BMI 28.94 kg/m   Wt Readings from Last 3 Encounters:  07/24/23 158 lb 3.2 oz (71.8 kg)  06/26/23 157 lb (71.2 kg)  03/02/23 157 lb 11.2 oz (71.5 kg)    Physical Exam Vitals and nursing note reviewed.  Constitutional:      General: She is awake. She is not in acute distress.    Appearance: She is well-developed and well-groomed. She is not ill-appearing.  HENT:     Head: Normocephalic. Laceration present.      Right Ear: Hearing normal.     Left Ear: Hearing normal.  Eyes:     General: Lids are normal.        Right eye: No discharge.        Left eye: No discharge.     Conjunctiva/sclera: Conjunctivae normal.     Pupils: Pupils are equal, round, and reactive to light.  Neck:     Thyroid: No thyromegaly.     Vascular: No carotid  bruit.  Cardiovascular:     Rate and Rhythm: Normal rate and regular rhythm.     Heart sounds: Normal heart sounds. No murmur heard.    No gallop.  Pulmonary:     Effort: Pulmonary effort is normal. No accessory muscle usage or respiratory distress.     Breath  sounds: Normal breath sounds.  Abdominal:     General: Bowel sounds are normal.     Palpations: Abdomen is soft.     Tenderness: There is no abdominal tenderness.  Musculoskeletal:     Cervical back: Normal range of motion and neck supple.     Right lower leg: No edema.     Left lower leg: No edema.     Comments: Kyphosis present.  Lymphadenopathy:     Head:     Right side of head: No submental, submandibular, tonsillar, preauricular or posterior auricular adenopathy.     Left side of head: No submental, submandibular, tonsillar, preauricular or posterior auricular adenopathy.  Skin:    General: Skin is warm and dry.  Neurological:     Mental Status: She is alert.     Cranial Nerves: Cranial nerves 2-12 are intact.     Motor: Motor function is intact.     Coordination: Coordination is intact.     Gait: Gait is intact.     Deep Tendon Reflexes: Reflexes are normal and symmetric.     Reflex Scores:      Brachioradialis reflexes are 2+ on the right side and 2+ on the left side.      Patellar reflexes are 2+ on the right side and 2+ on the left side.    Comments: Not able to report year  Is able to report day of week and month.  Able to report location and state + President.  Psychiatric:        Attention and Perception: Attention normal.        Mood and Affect: Mood normal.        Speech: Speech normal.        Behavior: Behavior normal. Behavior is cooperative.        Thought Content: Thought content normal.     Results for orders placed or performed in visit on 02/24/23  CMP (Cancer Center only)  Result Value Ref Range   Sodium 138 135 - 145 mmol/L   Potassium 3.6 3.5 - 5.1 mmol/L   Chloride 106 98 - 111 mmol/L    CO2 25 22 - 32 mmol/L   Glucose, Bld 165 (H) 70 - 99 mg/dL   BUN 14 8 - 23 mg/dL   Creatinine 0.86 (H) 5.78 - 1.00 mg/dL   Calcium 9.1 8.9 - 46.9 mg/dL   Total Protein 6.9 6.5 - 8.1 g/dL   Albumin 3.6 3.5 - 5.0 g/dL   AST 29 15 - 41 U/L   ALT 9 0 - 44 U/L   Alkaline Phosphatase 61 38 - 126 U/L   Total Bilirubin 0.8 0.3 - 1.2 mg/dL   GFR, Estimated 52 (L) >60 mL/min   Anion gap 7 5 - 15  CBC with Differential (Cancer Center Only)  Result Value Ref Range   WBC Count 5.3 4.0 - 10.5 K/uL   RBC 3.89 3.87 - 5.11 MIL/uL   Hemoglobin 12.0 12.0 - 15.0 g/dL   HCT 62.9 52.8 - 41.3 %   MCV 93.1 80.0 - 100.0 fL   MCH 30.8 26.0 - 34.0 pg   MCHC 33.1 30.0 - 36.0 g/dL   RDW 24.4 01.0 - 27.2 %   Platelet Count 148 (L) 150 - 400 K/uL   nRBC 0.0 0.0 - 0.2 %   Neutrophils Relative % 64 %   Neutro Abs 3.4 1.7 - 7.7 K/uL   Lymphocytes Relative 25 %   Lymphs Abs 1.3 0.7 -  4.0 K/uL   Monocytes Relative 9 %   Monocytes Absolute 0.5 0.1 - 1.0 K/uL   Eosinophils Relative 2 %   Eosinophils Absolute 0.1 0.0 - 0.5 K/uL   Basophils Relative 0 %   Basophils Absolute 0.0 0.0 - 0.1 K/uL   Immature Granulocytes 0 %   Abs Immature Granulocytes 0.01 0.00 - 0.07 K/uL  Kappa/lambda light chains  Result Value Ref Range   Kappa free light chain 31.7 (H) 3.3 - 19.4 mg/L   Lambda free light chains 23.0 5.7 - 26.3 mg/L   Kappa, lambda light chain ratio 1.38 0.26 - 1.65  Multiple Myeloma Panel (SPEP&IFE w/QIG)  Result Value Ref Range   IgG (Immunoglobin G), Serum 1,156 586 - 1,602 mg/dL   IgA 098 64 - 119 mg/dL   IgM (Immunoglobulin M), Srm 230 (H) 26 - 217 mg/dL   Total Protein ELP 6.2 6.0 - 8.5 g/dL   Albumin SerPl Elph-Mcnc 3.7 2.9 - 4.4 g/dL   Alpha 1 0.3 0.0 - 0.4 g/dL   Alpha2 Glob SerPl Elph-Mcnc 0.4 0.4 - 1.0 g/dL   B-Globulin SerPl Elph-Mcnc 0.7 0.7 - 1.3 g/dL   Gamma Glob SerPl Elph-Mcnc 1.1 0.4 - 1.8 g/dL   M Protein SerPl Elph-Mcnc Not Observed Not Observed g/dL   Globulin, Total 2.5 2.2 - 3.9  g/dL   Albumin/Glob SerPl 1.5 0.7 - 1.7   IFE 1 Comment (A)    Please Note Comment       Assessment & Plan:   Problem List Items Addressed This Visit       Cardiovascular and Mediastinum   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  BP below goal in office today for age on recheck.  Continue current medication regimen and adjust as needed -- could consider reduction of medication in future if BP remains stable.  LABS: up to date.  Renal dose medications as needed.  Recommend she continue to monitor BP at home at least a few days a week and document + focus on DASH diet.          Nervous and Auditory   Alzheimer disease (HCC) - Primary    Chronic, progressive.  Continue Aricept daily and collaboration with neurology.  Recent notes reviewed. Will get home health PT involved to work on fall prevention in home.      Relevant Orders   Ambulatory referral to Home Health   Subdural hematoma (HCC)    After fall on 07/20/23.  Overall reports no red flag symptoms today.  Monitor closely and avoid any ASA at this time.  Will get home health PT involved to work on fall prevention in home.        Musculoskeletal and Integument   Age related osteoporosis (Chronic)    Ongoing.  Will continue Evista at this time and plan on repeat DEXA in 2 years, 2025. One recent fall with no fractures, but subdural hematoma present.  Continue supplements.          Genitourinary   CKD (chronic kidney disease), stage III (HCC) (Chronic)    Chronic with CKD 3a.  Monitor BMP/CMP every 6 months and refer to nephrology as needed if worsening CKD.  Renal dose medications as needed.          Other   MGUS (monoclonal gammopathy of unknown significance) (Chronic)    Chronic, ongoing.  Continue collaboration with oncology, recent note reviewed.      At high risk  for falls    On 07/20/23 had fall with subdural hematoma.  Will get home health PT involved to work on fall prevention  in home, especially due to patient underlying dementia.      Relevant Orders   Ambulatory referral to Home Health   Pain in both lower extremities    Per [facility] to obtain doppler u/s in two weeks to check for DVT due to patient leg pain.  Will order these to be scheduled then and determine next steps after results return.      Relevant Orders   US Venous Img Lower Bilateral (DVT)      Follow up plan: Return in about 2 weeks (around 08/07/2023) for Check on Stitches and review imaging.

## 2023-07-24 NOTE — Assessment & Plan Note (Signed)
Chronic, progressive.  Continue Aricept daily and collaboration with neurology.  Recent notes reviewed. Will get home health PT involved to work on fall prevention in home.

## 2023-07-24 NOTE — Assessment & Plan Note (Signed)
On 07/20/23 had fall with subdural hematoma.  Will get home health PT involved to work on fall prevention in home, especially due to patient underlying dementia.

## 2023-07-25 ENCOUNTER — Telehealth: Payer: Self-pay | Admitting: *Deleted

## 2023-07-25 NOTE — Telephone Encounter (Signed)
Requested Prescriptions  Pending Prescriptions Disp Refills   allopurinol (ZYLOPRIM) 100 MG tablet [Pharmacy Med Name: ALLOPURINOL TABS 100MG ] 90 tablet 2    Sig: TAKE 1 TABLET DAILY     Endocrinology:  Gout Agents - allopurinol Failed - 07/24/2023  1:47 AM      Failed - Cr in normal range and within 360 days    Creatinine  Date Value Ref Range Status  02/24/2023 1.05 (H) 0.44 - 1.00 mg/dL Final         Passed - Uric Acid in normal range and within 360 days    Uric Acid  Date Value Ref Range Status  01/19/2023 4.5 3.1 - 7.9 mg/dL Final    Comment:               Therapeutic target for gout patients: <6.0         Passed - Valid encounter within last 12 months    Recent Outpatient Visits           Yesterday Alzheimer disease (HCC)   [place] [facility] [place], Corrie Dandy T, NP   6 months ago Alzheimer disease (HCC)   [place] [facility] [place], Dorie Rank, NP   9 months ago Memory changes   [place] [facility] [place], [place] T, NP   10 months ago Stage 3a chronic kidney disease (HCC)   [place] [facility] [place], Corrie Dandy T, NP   1 year ago Stage 3a chronic kidney disease (HCC)   [place] [facility] [place], Corrie Dandy T, NP       Future Appointments             In 2 weeks [name], Dorie Rank, NP  [facility]            Passed - CBC within normal limits and completed in the last 12 months    WBC  Date Value Ref Range Status  02/25/2022 6.2 4.0 - 10.5 K/uL Final   WBC Count  Date Value Ref Range Status  02/24/2023 5.3 4.0 - 10.5 K/uL Final   RBC  Date Value Ref Range Status  02/24/2023 3.89 3.87 - 5.11 MIL/uL Final   Hemoglobin  Date Value Ref Range Status  02/24/2023 12.0 12.0 - 15.0 g/dL Final  16/09/9603 54.0 11.1 - 15.9 g/dL Final   HCT  Date Value Ref Range Status  02/24/2023 36.2 36.0 - 46.0 % Final   Hematocrit  Date Value Ref Range Status   01/19/2023 40.8 34.0 - 46.6 % Final   MCHC  Date Value Ref Range Status  02/24/2023 33.1 30.0 - 36.0 g/dL Final   [facility]  Date Value Ref Range Status  02/24/2023 30.8 26.0 - 34.0 pg Final   MCV  Date Value Ref Range Status  02/24/2023 93.1 80.0 - 100.0 fL Final  01/19/2023 95 79 - 97 fL Final   No results found for: "PLTCOUNTKUC", "LABPLAT", "POCPLA" RDW  Date Value Ref Range Status  02/24/2023 14.1 11.5 - 15.5 % Final  01/19/2023 14.2 11.7 - 15.4 % Final

## 2023-07-25 NOTE — Transitions of Care (Post Inpatient/ED Visit) (Signed)
07/25/2023  Name: Cynthia Hurst MRN: [id number] DOB: Jul 03, 1935  Today's TOC FU Call Status: Today's TOC FU Call Status:: Successful TOC FU Call Completed TOC FU Call Complete Date: 07/25/23  Transition Care Management Follow-up Telephone Call Date of Discharge: 07/22/23 Discharge Facility: Other ([facility]) Name of Other ([facility]) Discharge Facility: [facility] Type of Discharge: Inpatient Admission Primary Inpatient Discharge Diagnosis:: Fall How have you been since you were released from the hospital?: Better Any questions or concerns?: No  Items Reviewed: Did you receive and understand the discharge instructions provided?: Yes Medications obtained,verified, and reconciled?: Yes (Medications Reviewed) Any new allergies since your discharge?: No Dietary orders reviewed?: No Do you have support at home?: Yes People in Home: spouse Name of Support/Comfort Primary Source: [name] spouse and lisa daughter  Medications Reviewed Today: Medications Reviewed Today     Reviewed by Luella Cook, RN (Case Manager) on 07/25/23 at 1130  Med List Status: <None>   Medication Order Taking? Sig Documenting Provider Last Dose Status Informant  alendronate (FOSAMAX) 70 MG tablet [id number] Yes Take 1 tablet (70 mg total) by mouth every 7 (seven) days. Take with a full glass of water on an empty stomach. Aura Dials T, NP Taking Active   allopurinol (ZYLOPRIM) 100 MG tablet [id number] Yes Take 1 tablet (100 mg total) by mouth daily. Aura Dials T, NP Taking Active   cholecalciferol (VITAMIN D) 1000 UNITS tablet [id number] Yes Take 1,000 Units by mouth daily. [provider] Taking Active   cyanocobalamin 2000 MCG tablet [id number] Yes Take 2,000 mcg by mouth daily. [provider] Taking Active   donepezil (ARICEPT) 10 MG tablet [id number] Yes Take 1 tablet (10 mg total) by mouth at bedtime. Aura Dials T, NP Taking Active   NIFEdipine (ADALAT CC) 60 MG 24 hr  tablet [id number] Yes Take 1 tablet (60 mg total) by mouth daily. Aura Dials T, NP Taking Active   Nutritional Supplements (ENSURE HIGH PROTEIN) LIQD [id number] Yes Take 3 Bottles by mouth daily. Marjie Skiff, NP Taking Active   potassium chloride SA (KLOR-CON M20) 20 MEQ tablet [id number] Yes TAKE 1 TABLET DAILY [name], [name] T, NP Taking Active             Home Care and Equipment/Supplies: Were Home Health Services Ordered?: NA Any new equipment or medical supplies ordered?: NA  Functional Questionnaire: Do you need assistance with bathing/showering or dressing?: Yes Do you need assistance with meal preparation?: Yes Do you need assistance with eating?: No Do you have difficulty maintaining continence: No Do you need assistance with getting out of bed/getting out of a chair/moving?: No Do you have difficulty managing or taking your medications?: No  Follow up appointments reviewed: PCP Follow-up appointment confirmed?: Yes Date of PCP follow-up appointment?: 07/25/23 Follow-up Provider: Aura Dials NP, [id number] [facility] Follow-up appointment confirmed?: NA Do you need transportation to your follow-up appointment?: No Do you understand care options if your condition(s) worsen?: Yes-patient verbalized understanding  SDOH Interventions Today    Flowsheet Row Most Recent Value  SDOH Interventions   Food Insecurity Interventions Intervention Not Indicated  Transportation Interventions Intervention Not Indicated, Patient Resources (Friends/Family)      TOC Interventions Today    Flowsheet Row Most Recent Value  TOC Interventions   TOC Interventions Discussed/Reviewed TOC Interventions Discussed, TOC Interventions Reviewed      Interventions Today    Flowsheet Row Most Recent Value  General Interventions   General Interventions Discussed/Reviewed General Interventions Discussed,  General Interventions Reviewed, Doctor Visits  Doctor  Visits Discussed/Reviewed Doctor Visits Discussed, Doctor Visits Reviewed  Nutrition Interventions   Nutrition Discussed/Reviewed Nutrition Discussed, Supplemental nutrition  [RN sent [employer] coupons to offset cost]  Pharmacy Interventions   Pharmacy Dicussed/Reviewed Pharmacy Topics Discussed       Gean Maidens BSN RN [facility] [phone]

## 2023-07-27 ENCOUNTER — Ambulatory Visit: Payer: Self-pay | Admitting: *Deleted

## 2023-07-27 DIAGNOSIS — I129 Hypertensive chronic kidney disease with stage 1 through stage 4 chronic kidney disease, or unspecified chronic kidney disease: Secondary | ICD-10-CM | POA: Diagnosis not present

## 2023-07-27 DIAGNOSIS — M109 Gout, unspecified: Secondary | ICD-10-CM | POA: Diagnosis not present

## 2023-07-27 DIAGNOSIS — N183 Chronic kidney disease, stage 3 unspecified: Secondary | ICD-10-CM | POA: Diagnosis not present

## 2023-07-27 DIAGNOSIS — R072 Precordial pain: Secondary | ICD-10-CM | POA: Diagnosis not present

## 2023-07-27 DIAGNOSIS — R0781 Pleurodynia: Secondary | ICD-10-CM | POA: Diagnosis not present

## 2023-07-27 DIAGNOSIS — J811 Chronic pulmonary edema: Secondary | ICD-10-CM | POA: Diagnosis not present

## 2023-07-27 DIAGNOSIS — J9811 Atelectasis: Secondary | ICD-10-CM | POA: Diagnosis not present

## 2023-07-27 DIAGNOSIS — R9431 Abnormal electrocardiogram [ECG] [EKG]: Secondary | ICD-10-CM | POA: Diagnosis not present

## 2023-07-27 DIAGNOSIS — G309 Alzheimer's disease, unspecified: Secondary | ICD-10-CM | POA: Diagnosis not present

## 2023-07-27 DIAGNOSIS — R079 Chest pain, unspecified: Secondary | ICD-10-CM | POA: Diagnosis not present

## 2023-07-27 DIAGNOSIS — R0789 Other chest pain: Secondary | ICD-10-CM | POA: Diagnosis not present

## 2023-07-27 DIAGNOSIS — Z79899 Other long term (current) drug therapy: Secondary | ICD-10-CM | POA: Diagnosis not present

## 2023-07-27 DIAGNOSIS — M7989 Other specified soft tissue disorders: Secondary | ICD-10-CM | POA: Diagnosis not present

## 2023-07-27 DIAGNOSIS — F0280 Dementia in other diseases classified elsewhere without behavioral disturbance: Secondary | ICD-10-CM | POA: Diagnosis not present

## 2023-07-27 NOTE — Telephone Encounter (Signed)
Called and spoke with patient's daughter. Patient's daughter states that they are currently at [facility] now.

## 2023-07-27 NOTE — Telephone Encounter (Signed)
Chief Complaint: severe pain in center of chest after a fall last week with any movement or breathing in  Symptoms: pain center of chest  Frequency: 1 week  Pertinent Negatives: Patient denies chet pain with difficulty breathing at rest. No sweating no dizziness reported  Disposition: [x] ED /[] Urgent Care (no appt availability in office) / [] Appointment(In office/virtual)/ []  [place] [facility]/ [] Home Care/ [] Refused Recommended Disposition /[] [place] [facility]/ []  Follow-up with PCP Additional Notes:   Patient's daughter reports she is going to take patient to ED now due to patient crying in pain with any movement.     Reason for Disposition  SEVERE chest pain  Answer Assessment - Initial Assessment Questions 1. LOCATION: "Where does it hurt?"       Sternal area pain with breathing and turning center of chest  2. RADIATION: "Does the pain go anywhere else?" (e.g., into neck, jaw, arms, back)     Severe pain in chest does not radiate anywhere else 3. ONSET: "When did the chest pain begin?" (Minutes, hours or days)      On going after a fall last week 4. PATTERN: "Does the pain come and go, or has it been constant since it started?"  "Does it get worse with exertion?"      Comes and goes with movement and breathing in 5. DURATION: "How long does it last" (e.g., seconds, minutes, hours)     na 6. SEVERITY: "How bad is the pain?"  (e.g., Scale 1-10; mild, moderate, or severe)    - MILD (1-3): doesn't interfere with normal activities     - MODERATE (4-7): interferes with normal activities or awakens from sleep    - SEVERE (8-10): excruciating pain, unable to do any normal activities       Severe, crying due to pain 7. CARDIAC RISK FACTORS: "Do you have any history of heart problems or risk factors for heart disease?" (e.g., angina, prior heart attack; diabetes, high blood pressure, high cholesterol, smoker, or strong family history of heart disease)     na 8. PULMONARY RISK  FACTORS: "Do you have any history of lung disease?"  (e.g., blood clots in lung, asthma, emphysema, birth control pills)     na 9. CAUSE: "What do you think is causing the chest pain?"     Recent fall last week 10. OTHER SYMPTOMS: "Do you have any other symptoms?" (e.g., dizziness, nausea, vomiting, sweating, fever, difficulty breathing, cough)       Denies  11. PREGNANCY: "Is there any chance you are pregnant?" "When was your last menstrual period?"       na  Answer Assessment - Initial Assessment Questions 1. MECHANISM: "How did the injury happen?"     Feel last week  2. ONSET: "When did the injury happen?" (Minutes or hours ago)     1 week ago per patient daughter  3. LOCATION: "Where on the chest is the injury located?"     [place]  4. APPEARANCE: "What does the injury look like?"     na 5. BLEEDING: "Is there any bleeding now? If Yes, ask: How long has it been bleeding?"     na 6. SEVERITY: "Any difficulty with breathing?"     Pain in chest with breathing in  7. SIZE: For cuts, bruises, or swelling, ask: "How large is it?" (e.g., inches or centimeters)     na 8. PAIN: "Is there pain?" If Yes, ask: "How bad is the pain?"   (e.g., Scale 1-10;  or mild, moderate, severe)     severe 9. TETANUS: For any breaks in the skin, ask: "When was the last tetanus booster?"     na 10. PREGNANCY: "Is there any chance you are pregnant?" "When was your last menstrual period?"       na  Protocols used: Chest Pain-A-AH, Chest Injury-A-AH

## 2023-08-06 NOTE — Patient Instructions (Signed)
 Dementia Caregiver Guide Dementia is a condition that affects the way the brain works. It often affects thinking and memory. A person with dementia may: Forget things. Have trouble talking or responding to your questions. Have trouble paying attention. Have trouble thinking clearly and making good decisions. Get lost or wander away from home or other places. Have big changes in their mood or emotions. They may: Feel very worried, nervous, or depressed. Have angry outbursts. Be suspicious or accuse you of things. Have childlike behavior and language. Taking care of someone with dementia can be a challenge. The tips below can help you care for the person. How to help manage lifestyle changes Dementia usually gets worse slowly over time. In the early stages, people with dementia can stay safe and take care of themselves with some help. In later stages, they need help with daily tasks like getting dressed, grooming, and going to the bathroom. Communicating When the person is talking and seems frustrated, make eye contact and hold the person's hand. Ask questions that can be answered with a yes or no. Use simple words and a calm voice. Only give one direction at a time. Limit choices for the person. Too many choices can be stressful. Avoid correcting the person in a negative way. If the person can't find the right words, gently try to help. Preventing injury  Keep floors clear. Remove rugs, magazine racks, and floor lamps. Keep hallways well lit, especially at night. Put a handrail and nonslip mat in the bathtub or shower. Put childproof locks on cabinets that have dangerous items in them. These items include medicine, alcohol, guns, cleaning products, and sharp tools. For doors to the outside, put locks where the person can't see or reach them. This helps keep the person from going out of the house and getting lost. Be ready for emergencies. Keep a list of emergency phone numbers and  addresses close by. Remove car keys and lock garage doors so the person doesn't try to drive. Have the person wear a bracelet that tracks where they are and shows that they're a person with memory loss. This should be worn at all times for safety. Helping with daily life  Keep the person on track with their daily routine. Try to identify areas where the person may need help. Be supportive, patient, calm, and encouraging. Gently remind the person that adjusting to changes takes time. Help with the tasks that the person has asked for help with. Keep the person involved in daily tasks and decisions as much as you can. Encourage conversation, but try not to get frustrated if the person struggles to find words or doesn't seem to appreciate your help. Other tips Think about any safety risks and take steps to avoid them. Keep things organized: Organize medicines in a pill box for each day of the week. Keep a calendar in a central place. Use it to remind the person of health care visits or other activities. Create a plan to handle any legal or financial matters. Get help from a professional if needed. Help make sure the person: Takes medicines only as told by their health care providers. Eats regular, healthy meals. They should also drink plenty of fluids. Goes to all scheduled health care appointments. Gets regular sleep. Taking care of yourself Being a caregiver for someone with dementia can be hard. You may feel stressed and have many other emotions. It's important to also take care of yourself. Here are some tips: Find out about services that  can provide short-term care for the person. This is called respite care. It can allow you to take a break when you need one. Find healthy ways to deal with stress. Some ways include: Spending time with other people. Exercising. Meditating or doing deep breathing exercises. Take care of your own health by: Getting enough sleep. Eating healthy  foods. Getting regular exercise. Join a support group with others who are caregivers. These groups can help you: Learn other ways to deal with stress. Share experiences with others. Get emotional comfort and support. Learn about caregiving as the disease gets worse. Find resources in your community. Where to find support: Many people and organizations offer support. These include: Support groups for people with dementia. Support groups for caregivers. Counselors or therapists. Home health care services. Adult day care centers. Where to find more information Alzheimer's Association: [employer] Family Caregiver Alliance: caregiver.org Alzheimer's Foundation of [place]: alzfdn.org Contact a health care provider if: The person's health is quickly getting worse. You're no longer able to care for the person. Caring for the person is affecting your physical and emotional health. You're feeling worried, nervous, or depressed about caring for the person. Get help right away if: You feel like the person may hurt themselves or others. The person has talked about taking their own life. These symptoms may be an emergency. Take one of these steps right away: Go to your nearest emergency room. Call 911. Call the National Suicide Prevention Lifeline at 401 856 7434 or 988. Text the Crisis Text Line at 320-444-2567. This information is not intended to replace advice given to you by your health care provider. Make sure you discuss any questions you have with your health care provider. Document Revised: 03/10/2023 Document Reviewed: 03/10/2023 Elsevier Patient Education  2024 [employer].

## 2023-08-08 DIAGNOSIS — F0280 Dementia in other diseases classified elsewhere without behavioral disturbance: Secondary | ICD-10-CM | POA: Diagnosis not present

## 2023-08-08 DIAGNOSIS — G309 Alzheimer's disease, unspecified: Secondary | ICD-10-CM | POA: Diagnosis not present

## 2023-08-08 DIAGNOSIS — N1831 Chronic kidney disease, stage 3a: Secondary | ICD-10-CM | POA: Diagnosis not present

## 2023-08-08 DIAGNOSIS — I129 Hypertensive chronic kidney disease with stage 1 through stage 4 chronic kidney disease, or unspecified chronic kidney disease: Secondary | ICD-10-CM | POA: Diagnosis not present

## 2023-08-08 DIAGNOSIS — S065X0D Traumatic subdural hemorrhage without loss of consciousness, subsequent encounter: Secondary | ICD-10-CM | POA: Diagnosis not present

## 2023-08-08 DIAGNOSIS — M810 Age-related osteoporosis without current pathological fracture: Secondary | ICD-10-CM | POA: Diagnosis not present

## 2023-08-09 ENCOUNTER — Encounter: Payer: Self-pay | Admitting: Nurse Practitioner

## 2023-08-09 ENCOUNTER — Ambulatory Visit (INDEPENDENT_AMBULATORY_CARE_PROVIDER_SITE_OTHER): Payer: Medicare Other | Admitting: Nurse Practitioner

## 2023-08-09 VITALS — BP 115/63 | HR 80 | Ht 62.0 in | Wt 156.4 lb

## 2023-08-09 DIAGNOSIS — S0101XD Laceration without foreign body of scalp, subsequent encounter: Secondary | ICD-10-CM | POA: Diagnosis not present

## 2023-08-09 DIAGNOSIS — G309 Alzheimer's disease, unspecified: Secondary | ICD-10-CM | POA: Diagnosis not present

## 2023-08-09 DIAGNOSIS — F0280 Dementia in other diseases classified elsewhere without behavioral disturbance: Secondary | ICD-10-CM | POA: Diagnosis not present

## 2023-08-09 DIAGNOSIS — Z79899 Other long term (current) drug therapy: Secondary | ICD-10-CM | POA: Diagnosis not present

## 2023-08-09 NOTE — Progress Notes (Signed)
BP 115/63   Pulse 80   Ht 5\' 2"  (1.575 m)   Wt 156 lb 6.4 oz (70.9 kg)   LMP  (LMP Unknown)   SpO2 97%   BMI 28.61 kg/m    Subjective:    Patient ID: Cynthia Hurst, female    DOB: May 22, 1935, 88 y.o.   MRN: [id number]  HPI: Cynthia Hurst is a 88 y.o. female  Chief Complaint  Patient presents with   Wound Check    Patient is here for Wound Care.    Medication Management    Patient daughter says she separates the patient medications for each day and when she went to check for today. The medications were missing, but the patient says she did not take them and daughter is concerned about giving her more medication for today.    Daughter at bedside with patient.  SKIN LACERATION Follow-up to check on sutures from recent fall, to anterior right occiput -- underlying dementia.  Currently has OT and PT in home.  Overall has healed well and sutures have dissolved. Well approximated.  She denies any pain.  Has had no further falls at this time.  We discussed her medications from today. Duration: weeks Location: right anterior occiput Redness: no Swelling: no Oozing: no Pus: no Fevers: no Nausea/vomiting: no Status: better Treatments attempted: sutures   Relevant past medical, surgical, family and social history reviewed and updated as indicated. Interim medical history since our last visit reviewed. Allergies and medications reviewed and updated.  Review of Systems  Constitutional:  Negative for activity change, appetite change, diaphoresis, fatigue and fever.  Respiratory:  Negative for cough, chest tightness and shortness of breath.   Cardiovascular:  Negative for chest pain, palpitations and leg swelling.  Gastrointestinal: Negative.   Musculoskeletal:  Negative for back pain.  Skin:  Positive for wound.  Neurological: Negative.   Psychiatric/Behavioral: Negative.      Per HPI unless specifically indicated above     Objective:    BP 115/63   Pulse 80   Ht  5\' 2"  (1.575 m)   Wt 156 lb 6.4 oz (70.9 kg)   LMP  (LMP Unknown)   SpO2 97%   BMI 28.61 kg/m   Wt Readings from Last 3 Encounters:  08/09/23 156 lb 6.4 oz (70.9 kg)  07/24/23 158 lb 3.2 oz (71.8 kg)  06/26/23 157 lb (71.2 kg)    Physical Exam Vitals and nursing note reviewed.  Constitutional:      General: She is awake. She is not in acute distress.    Appearance: She is well-developed and well-groomed. She is not ill-appearing.  HENT:     Head: Normocephalic. Laceration present.      Right Ear: Hearing normal.     Left Ear: Hearing normal.  Eyes:     General: Lids are normal.        Right eye: No discharge.        Left eye: No discharge.     Conjunctiva/sclera: Conjunctivae normal.     Pupils: Pupils are equal, round, and reactive to light.  Neck:     Vascular: No carotid bruit.  Cardiovascular:     Rate and Rhythm: Normal rate and regular rhythm.     Heart sounds: Normal heart sounds. No murmur heard.    No gallop.  Pulmonary:     Effort: Pulmonary effort is normal. No accessory muscle usage or respiratory distress.     Breath sounds: Normal breath sounds.  Abdominal:     General: Bowel sounds are normal.     Palpations: Abdomen is soft.     Tenderness: There is no abdominal tenderness.  Musculoskeletal:     Cervical back: Normal range of motion and neck supple.     Right lower leg: No edema.     Left lower leg: No edema.     Comments: Kyphosis present.  Skin:    General: Skin is warm and dry.  Neurological:     Mental Status: She is alert.     Cranial Nerves: Cranial nerves 2-12 are intact.     Motor: Motor function is intact.     Coordination: Coordination is intact.     Gait: Gait is intact.     Deep Tendon Reflexes: Reflexes are normal and symmetric.     Reflex Scores:      Brachioradialis reflexes are 2+ on the right side and 2+ on the left side.      Patellar reflexes are 2+ on the right side and 2+ on the left side.    Comments: Not able to report  year  Is able to report day of week and month.  Able to report location and state + President.  Psychiatric:        Attention and Perception: Attention normal.        Mood and Affect: Mood normal.        Speech: Speech normal.        Behavior: Behavior normal. Behavior is cooperative.        Thought Content: Thought content normal.     Results for orders placed or performed in visit on 02/24/23  CMP (Cancer Center only)  Result Value Ref Range   Sodium 138 135 - 145 mmol/L   Potassium 3.6 3.5 - 5.1 mmol/L   Chloride 106 98 - 111 mmol/L   CO2 25 22 - 32 mmol/L   Glucose, Bld 165 (H) 70 - 99 mg/dL   BUN 14 8 - 23 mg/dL   Creatinine 4.09 (H) 8.11 - 1.00 mg/dL   Calcium 9.1 8.9 - 91.4 mg/dL   Total Protein 6.9 6.5 - 8.1 g/dL   Albumin 3.6 3.5 - 5.0 g/dL   AST 29 15 - 41 U/L   ALT 9 0 - 44 U/L   Alkaline Phosphatase 61 38 - 126 U/L   Total Bilirubin 0.8 0.3 - 1.2 mg/dL   GFR, Estimated 52 (L) >60 mL/min   Anion gap 7 5 - 15  CBC with Differential (Cancer Center Only)  Result Value Ref Range   WBC Count 5.3 4.0 - 10.5 K/uL   RBC 3.89 3.87 - 5.11 MIL/uL   Hemoglobin 12.0 12.0 - 15.0 g/dL   HCT 78.2 95.6 - 21.3 %   MCV 93.1 80.0 - 100.0 fL   MCH 30.8 26.0 - 34.0 pg   MCHC 33.1 30.0 - 36.0 g/dL   RDW 08.6 57.8 - 46.9 %   Platelet Count 148 (L) 150 - 400 K/uL   nRBC 0.0 0.0 - 0.2 %   Neutrophils Relative % 64 %   Neutro Abs 3.4 1.7 - 7.7 K/uL   Lymphocytes Relative 25 %   Lymphs Abs 1.3 0.7 - 4.0 K/uL   Monocytes Relative 9 %   Monocytes Absolute 0.5 0.1 - 1.0 K/uL   Eosinophils Relative 2 %   Eosinophils Absolute 0.1 0.0 - 0.5 K/uL   Basophils Relative 0 %   Basophils Absolute 0.0 0.0 - 0.1  K/uL   Immature Granulocytes 0 %   Abs Immature Granulocytes 0.01 0.00 - 0.07 K/uL  Kappa/lambda light chains  Result Value Ref Range   Kappa free light chain 31.7 (H) 3.3 - 19.4 mg/L   Lambda free light chains 23.0 5.7 - 26.3 mg/L   Kappa, lambda light chain ratio 1.38 0.26 - 1.65   Multiple Myeloma Panel (SPEP&IFE w/QIG)  Result Value Ref Range   IgG (Immunoglobin G), Serum 1,156 586 - 1,602 mg/dL   IgA 132 64 - 440 mg/dL   IgM (Immunoglobulin M), Srm 230 (H) 26 - 217 mg/dL   Total Protein ELP 6.2 6.0 - 8.5 g/dL   Albumin SerPl Elph-Mcnc 3.7 2.9 - 4.4 g/dL   Alpha 1 0.3 0.0 - 0.4 g/dL   Alpha2 Glob SerPl Elph-Mcnc 0.4 0.4 - 1.0 g/dL   B-Globulin SerPl Elph-Mcnc 0.7 0.7 - 1.3 g/dL   Gamma Glob SerPl Elph-Mcnc 1.1 0.4 - 1.8 g/dL   M Protein SerPl Elph-Mcnc Not Observed Not Observed g/dL   Globulin, Total 2.5 2.2 - 3.9 g/dL   Albumin/Glob SerPl 1.5 0.7 - 1.7   IFE 1 Comment (A)    Please Note Comment       Assessment & Plan:   Problem List Items Addressed This Visit       Nervous and Auditory   Alzheimer disease (HCC) - Primary    Chronic, progressive.  Continue Aricept daily and collaboration with neurology.  Recent notes reviewed. Continue OT and PT at this time to assist with strengthening and fall prevention.      Other Visit Diagnoses     Laceration of scalp, subsequent encounter       Overall has healed and no further intervention needed.   Medication management       Medications this morning were gone, but daughter unsure if she took them, patient reports she did not.  Recommend they do not repeat dosing today.        Follow up plan: Return in about 12 weeks (around 11/01/2023) for DEMENTIA, HTN/HLD, CKD.

## 2023-08-09 NOTE — Assessment & Plan Note (Signed)
Chronic, progressive.  Continue Aricept daily and collaboration with neurology.  Recent notes reviewed. Continue OT and PT at this time to assist with strengthening and fall prevention.

## 2023-08-10 DIAGNOSIS — F0280 Dementia in other diseases classified elsewhere without behavioral disturbance: Secondary | ICD-10-CM | POA: Diagnosis not present

## 2023-08-10 DIAGNOSIS — S065X0D Traumatic subdural hemorrhage without loss of consciousness, subsequent encounter: Secondary | ICD-10-CM | POA: Diagnosis not present

## 2023-08-10 DIAGNOSIS — N1831 Chronic kidney disease, stage 3a: Secondary | ICD-10-CM | POA: Diagnosis not present

## 2023-08-10 DIAGNOSIS — M810 Age-related osteoporosis without current pathological fracture: Secondary | ICD-10-CM | POA: Diagnosis not present

## 2023-08-10 DIAGNOSIS — I129 Hypertensive chronic kidney disease with stage 1 through stage 4 chronic kidney disease, or unspecified chronic kidney disease: Secondary | ICD-10-CM | POA: Diagnosis not present

## 2023-08-10 DIAGNOSIS — G309 Alzheimer's disease, unspecified: Secondary | ICD-10-CM | POA: Diagnosis not present

## 2023-08-10 DIAGNOSIS — S065XAA Traumatic subdural hemorrhage with loss of consciousness status unknown, initial encounter: Secondary | ICD-10-CM | POA: Diagnosis not present

## 2023-08-15 ENCOUNTER — Ambulatory Visit (INDEPENDENT_AMBULATORY_CARE_PROVIDER_SITE_OTHER): Payer: Medicare Other | Admitting: Emergency Medicine

## 2023-08-15 VITALS — Ht 63.0 in | Wt 156.0 lb

## 2023-08-15 DIAGNOSIS — G309 Alzheimer's disease, unspecified: Secondary | ICD-10-CM | POA: Diagnosis not present

## 2023-08-15 DIAGNOSIS — I129 Hypertensive chronic kidney disease with stage 1 through stage 4 chronic kidney disease, or unspecified chronic kidney disease: Secondary | ICD-10-CM | POA: Diagnosis not present

## 2023-08-15 DIAGNOSIS — N1831 Chronic kidney disease, stage 3a: Secondary | ICD-10-CM | POA: Diagnosis not present

## 2023-08-15 DIAGNOSIS — Z0000 Encounter for general adult medical examination without abnormal findings: Secondary | ICD-10-CM

## 2023-08-15 DIAGNOSIS — Z0100 Encounter for examination of eyes and vision without abnormal findings: Secondary | ICD-10-CM

## 2023-08-15 DIAGNOSIS — M810 Age-related osteoporosis without current pathological fracture: Secondary | ICD-10-CM | POA: Diagnosis not present

## 2023-08-15 DIAGNOSIS — F0280 Dementia in other diseases classified elsewhere without behavioral disturbance: Secondary | ICD-10-CM | POA: Diagnosis not present

## 2023-08-15 DIAGNOSIS — S065X0D Traumatic subdural hemorrhage without loss of consciousness, subsequent encounter: Secondary | ICD-10-CM | POA: Diagnosis not present

## 2023-08-15 NOTE — Progress Notes (Signed)
Subjective:   Cynthia Hurst is a 88 y.o. female who presents for Medicare Annual (Subsequent) preventive examination.  Visit Complete: Virtual  I connected with  Cynthia Hurst on 08/15/23 by a audio enabled telemedicine application and verified that I am speaking with the correct person using two identifiers.  Patient Location: Home  Provider Location: Home Office  I discussed the limitations of evaluation and management by telemedicine. The patient expressed understanding and agreed to proceed.   Vital Signs: Because this visit was a virtual/telehealth visit, some criteria may be missing or patient reported. Any vitals not documented were not able to be obtained and vitals that have been documented are patient reported.    Review of Systems     Cardiac Risk Factors include: advanced age (>104men, >7 women);hypertension     Objective:    Today's Vitals   08/15/23 1031  Weight: 156 lb (70.8 kg)  Height: 5\' 3"  (1.6 m)   Body mass index is 27.63 kg/m.     08/15/2023   10:47 AM 03/02/2023   10:08 AM 08/04/2022   12:36 PM 03/04/2022   10:08 AM 08/02/2021   11:20 AM 08/20/2020    9:44 AM 07/27/2020   11:21 AM  Advanced Directives  Does Patient Have a Medical Advance Directive? No No No Yes Yes No No  Type of Merchandiser, retail of [place];Living will    Does patient want to make changes to medical advance directive?  No - Patient declined       Copy of Healthcare Power of Attorney in Chart?     No - copy requested    Would patient like information on creating a medical advance directive? Yes (MAU/Ambulatory/Procedural Areas - Information given)  No - Patient declined   No - Patient declined     Current Medications (verified) Outpatient Encounter Medications as of 08/15/2023  Medication Sig   alendronate (FOSAMAX) 70 MG tablet Take 1 tablet (70 mg total) by mouth every 7 (seven) days. Take with a full glass of water on an empty stomach.   allopurinol  (ZYLOPRIM) 100 MG tablet TAKE 1 TABLET DAILY   Calcium Carb-Cholecalciferol (CALCIUM/VITAMIN D PO) Take 1 tablet by mouth daily.   cholecalciferol (VITAMIN D) 1000 UNITS tablet Take 1,000 Units by mouth daily.   cyanocobalamin 2000 MCG tablet Take 2,000 mcg by mouth daily.   donepezil (ARICEPT) 10 MG tablet Take 1 tablet (10 mg total) by mouth at bedtime.   NIFEdipine (ADALAT CC) 60 MG 24 hr tablet Take 1 tablet (60 mg total) by mouth daily.   potassium chloride SA (KLOR-CON M20) 20 MEQ tablet TAKE 1 TABLET DAILY   atenolol (TENORMIN) 100 MG tablet Take 100 mg by mouth daily. (Patient not taking: Reported on 08/15/2023)   Nutritional Supplements (ENSURE HIGH PROTEIN) LIQD Take 3 Bottles by mouth daily. (Patient not taking: Reported on 08/15/2023)   No facility-administered encounter medications on file as of 08/15/2023.    Allergies (verified) Patient has no known allergies.   History: Past Medical History:  Diagnosis Date   Chronic kidney disease    GERD (gastroesophageal reflux disease)    Gout    Hypertension    Hypopotassemia    Osteoarthritis    Osteoporosis    LUMBAR SPINE   Vitamin B 12 deficiency    Vitamin D deficiency    Past Surgical History:  Procedure Laterality Date   ABDOMINAL HYSTERECTOMY     PARTIAL  EYE SURGERY Left 05/2018   cataract surgery    JOINT REPLACEMENT Bilateral 2010,2011   KNEE   Family History  Problem Relation Age of Onset   Heart disease Mother    Cancer Father    Alzheimer's disease Father    Breast cancer Neg Hx    Social History   Socioeconomic History   Marital status: Married    Spouse name: Cynthia Hurst   Number of children: 7   Years of education: Not on file   Highest education level: High school graduate  Occupational History   Occupation: retired    Comment: Psychiatric nurse  Tobacco Use   Smoking status: Never   Smokeless tobacco: Never  Vaping Use   Vaping status: Never Used  Substance and Sexual Activity   Alcohol use: No    Drug use: No   Sexual activity: Yes  Other Topics Concern   Not on file  Social History Narrative   Married, 7 children, 6 living 1 deceased   Social Determinants of Health   Financial Resource Strain: Low Risk  (08/15/2023)   Overall Financial Resource Strain (CARDIA)    Difficulty of Paying Living Expenses: Not hard at all  Food Insecurity: No Food Insecurity (08/15/2023)   Hunger Vital Sign    Worried About Running Out of Food in the Last Year: Never true    Ran Out of Food in the Last Year: Never true  Transportation Needs: No Transportation Needs (08/15/2023)   PRAPARE - Administrator, Civil Service (Medical): No    Lack of Transportation (Non-Medical): No  Physical Activity: Inactive (08/15/2023)   Exercise Vital Sign    Days of Exercise per Week: 0 days    Minutes of Exercise per Session: 0 min  Stress: No Stress Concern Present (08/15/2023)   [employer] of Occupational Health - Occupational Stress Questionnaire    Feeling of Stress : Not at all  Social Connections: Moderately Integrated (08/15/2023)   Social Connection and Isolation Panel [NHANES]    Frequency of Communication with Friends and Family: More than three times a week    Frequency of Social Gatherings with Friends and Family: More than three times a week    Attends Religious Services: More than 4 times per year    Active Member of [employer] or Organizations: No    Attends Engineer, structural: Never    Marital Status: Married    Tobacco Counseling Counseling given: Not Answered   Clinical Intake:  Pre-visit preparation completed: Yes  Pain : No/denies pain     BMI - recorded: 27.63 Nutritional Status: BMI 25 -29 Overweight Nutritional Risks: None Diabetes: No  How often do you need to have someone help you when you read instructions, pamphlets, or other written materials from your doctor or pharmacy?: 1 - Never  Interpreter Needed?: No  Information entered by :: Tora Kindred, CMA   Activities of Daily Living    08/15/2023   10:34 AM  In your present state of health, do you have any difficulty performing the following activities:  Hearing? 0  Vision? 0  Difficulty concentrating or making decisions? 1  Walking or climbing stairs? 1  Comment gets sob  Dressing or bathing? 0  Doing errands, shopping? 1  Comment limited driving, daughter Insurance claims handler and eating ? N  Using the Toilet? N  In the past six months, have you accidently leaked urine? N  Do you have problems with loss of bowel  control? N  Managing your Medications? Y  Comment daughter fills pill box  Managing your Finances? N  Housekeeping or managing your Housekeeping? N    Patient Care Team: Marjie Skiff, NP as PCP - General (Nurse Practitioner) Dayna Barker, MD as Referring Physician (Internal Medicine) Rickard Patience, MD as Consulting Physician (Oncology)  Indicate any recent Medical Services you may have received from other than [facility] providers in the past year (date may be approximate).     Assessment:   This is a routine wellness examination for [place].  Hearing/Vision screen Hearing Screening - Comments:: Denies hearing loss Vision Screening - Comments:: Referral placed today  Dietary issues and exercise activities discussed:     Goals Addressed               This Visit's Progress     Patient Stated (pt-stated)        Exercise more and drink more water      Depression Screen    08/15/2023   10:45 AM 07/24/2023   11:28 AM 01/19/2023    9:10 AM 10/19/2022    9:13 AM 08/04/2022   12:45 PM 04/18/2022   10:14 AM 10/18/2021   10:55 AM  PHQ 2/9 Scores  PHQ - 2 Score 0 2 0 0 0 0 0  PHQ- 9 Score 0 6 2 1   0 0    Fall Risk    08/15/2023   10:51 AM 07/24/2023   11:28 AM 10/19/2022    9:12 AM 08/04/2022   12:36 PM 04/18/2022   10:14 AM  Fall Risk   Falls in the past year? 1 1 0 0 0  Number falls in past yr: 0 0 0 0 0  Injury with Fall? 1 1 0 0 0   Risk for fall due to : History of fall(s);Mental status change Impaired balance/gait;History of fall(s) No Fall Risks  No Fall Risks  Follow up Falls evaluation completed;Falls prevention discussed Falls evaluation completed Falls evaluation completed Falls evaluation completed;Education provided;Falls prevention discussed Falls evaluation completed    MEDICARE RISK AT HOME: Medicare Risk at Home Any stairs in or around the home?: Yes If so, are there any without handrails?: Yes Home free of loose throw rugs in walkways, pet beds, electrical cords, etc?: No Adequate lighting in your home to reduce risk of falls?: Yes Life alert?: Yes (doesn't wear) Use of a cane, walker or w/c?: No Grab bars in the bathroom?: No Shower chair or bench in shower?: Yes Elevated toilet seat or a handicapped toilet?: Yes  TIMED UP AND GO:  Was the test performed?  No    Cognitive Function:    09/27/2022   10:01 AM  MMSE - Mini Mental State Exam  Orientation to time 1  Orientation to Place 5  Registration 3  Attention/ Calculation 4  Recall 0  Language- name 2 objects 2  Language- repeat 1  Language- follow 3 step command 3  Language- read & follow direction 1  Write a sentence 1  Copy design 1  Total score 22      06/26/2023    2:40 PM 11/07/2022   11:16 AM  Montreal Cognitive Assessment   Visuospatial/ Executive (0/5) 2 2  Naming (0/3) 0 0  Attention: Read list of digits (0/2) 1 2  Attention: Read list of letters (0/1) 1 1  Attention: Serial 7 subtraction starting at 100 (0/3) 0 0  Language: Repeat phrase (0/2) 2 2  Language : Fluency (0/1) 1  1  Abstraction (0/2) 2 1  Delayed Recall (0/5) 0 0  Orientation (0/6) 4 3  Total 13 12      08/15/2023   10:57 AM 07/24/2023   11:49 AM 08/04/2022   12:37 PM 08/02/2021   11:22 AM 07/27/2020   11:25 AM  6CIT Screen  What Year? 4 points 4 points 4 points 0 points 0 points  What month? 0 points 0 points 0 points 0 points 0 points  What time?  0 points 0 points 0 points 0 points 0 points  Count back from 20 0 points 0 points 0 points 0 points 0 points  Months in reverse 4 points 4 points 2 points 2 points 4 points  Repeat phrase 10 points 8 points 10 points 2 points 0 points  Total Score 18 points 16 points 16 points 4 points 4 points    Immunizations Immunization History  Administered Date(s) Administered   Fluad Quad(high Dose 65+) 10/01/2020, 10/18/2021, 01/19/2023   Moderna Sars-Covid-2 Vaccination 01/24/2020, 02/21/2020   Pneumococcal Conjugate-13 10/18/2021   Pneumococcal Polysaccharide-23 03/23/2005   Td 03/23/2005   Tdap 07/20/2023    TDAP status: Up to date  Flu Vaccine status: Due, Education has been provided regarding the importance of this vaccine. Advised may receive this vaccine at local pharmacy or Health Dept. Aware to provide a copy of the vaccination record if obtained from local pharmacy or Health Dept. Verbalized acceptance and understanding.  Pneumococcal vaccine status: Up to date  Covid-19 vaccine status: Information provided on how to obtain vaccines.   Qualifies for Shingles Vaccine? Yes   Zostavax completed No   Shingrix Completed?: No.    Education has been provided regarding the importance of this vaccine. Patient has been advised to call insurance company to determine out of pocket expense if they have not yet received this vaccine. Advised may also receive vaccine at local pharmacy or Health Dept. Verbalized acceptance and understanding.  Screening Tests Health Maintenance  Topic Date Due   Zoster Vaccines- Shingrix (1 of 2) Never done   COVID-19 Vaccine (3 - Moderna risk series) 03/20/2020   INFLUENZA VACCINE  07/13/2023   DEXA SCAN  06/13/2024   Medicare Annual Wellness (AWV)  08/14/2024   DTaP/Tdap/Td (3 - Td or Tdap) 07/19/2033   Pneumonia Vaccine 6+ Years old  Completed   HPV VACCINES  Aged Out    Health Maintenance  Health Maintenance Due  Topic Date Due   Zoster Vaccines-  Shingrix (1 of 2) Never done   COVID-19 Vaccine (3 - Moderna risk series) 03/20/2020   INFLUENZA VACCINE  07/13/2023    Colorectal cancer screening: No longer required.   Mammogram status: No longer required due to age.  Bone Density status: Completed 06/13/22. Results reflect: Bone density results: OSTEOPOROSIS. Repeat every 2 years.  Lung Cancer Screening: (Low Dose CT Chest recommended if Age 25-80 years, 20 pack-year currently smoking OR have quit w/in 15years.) does not qualify.   Lung Cancer Screening Referral: n/a  Additional Screening:  Hepatitis C Screening: does not qualify; Completed n/a  Vision Screening: Recommended annual ophthalmology exams for early detection of glaucoma and other disorders of the eye. Is the patient up to date with their annual eye exam?  No  Who is the provider or what is the name of the office in which the patient attends annual eye exams? [facility] If pt is not established with a provider, would they like to be referred to a provider to establish care?  Yes .   Dental Screening: Recommended annual dental exams for proper oral hygiene   Community Resource Referral / Chronic Care Management: CRR required this visit?  No   CCM required this visit?  No     Plan:     I have personally reviewed and noted the following in the patient's chart:   Medical and social history Use of alcohol, tobacco or illicit drugs  Current medications and supplements including opioid prescriptions. Patient is not currently taking opioid prescriptions. Functional ability and status Nutritional status Physical activity Advanced directives List of other physicians Hospitalizations, surgeries, and ER visits in previous 12 months Vitals Screenings to include cognitive, depression, and falls Referrals and appointments  In addition, I have reviewed and discussed with patient certain preventive protocols, quality metrics, and best practice recommendations. A  written personalized care plan for preventive services as well as general preventive health recommendations were provided to patient.     Tora Kindred, CMA   08/15/2023   After Visit Summary: (Mail) Due to this being a telephonic visit, the after visit summary with patients personalized plan was offered to patient via mail   Nurse Notes:  Today's visit completed with the help of Misty Stanley, patient's daughter. 6 CIT Score - 18 Placed optometry referral to [facility]. Will get flu shot in the fall. Recommended RSV vaccine

## 2023-08-15 NOTE — Patient Instructions (Addendum)
Ms. Bondoc , Thank you for taking time to come for your Medicare Wellness Visit. I appreciate your ongoing commitment to your health goals. Please review the following plan we discussed and let me know if I can assist you in the future.   Referrals/Orders/Follow-Ups/Clinician Recommendations: Get flu shot in the fall. Recommend the RSV vaccine.  This is a list of the screening recommended for you and due dates:  Health Maintenance  Topic Date Due   Zoster (Shingles) Vaccine (1 of 2) Never done   COVID-19 Vaccine (3 - Moderna risk series) 03/20/2020   Flu Shot  07/13/2023   DEXA scan (bone density measurement)  06/13/2024   Medicare Annual Wellness Visit  08/14/2024   DTaP/Tdap/Td vaccine (3 - Td or Tdap) 07/19/2033   Pneumonia Vaccine  Completed   HPV Vaccine  Aged Out    Advanced directives: (ACP Link)Information on Advanced Care Planning can be found at [facility] Advance Health Care Directives Advance Health Care Directives ([email or web address]   Next Medicare Annual Wellness Visit scheduled for next year: Yes, 08/20/24 @ 10:30am

## 2023-08-21 DIAGNOSIS — I129 Hypertensive chronic kidney disease with stage 1 through stage 4 chronic kidney disease, or unspecified chronic kidney disease: Secondary | ICD-10-CM | POA: Diagnosis not present

## 2023-08-21 DIAGNOSIS — F0280 Dementia in other diseases classified elsewhere without behavioral disturbance: Secondary | ICD-10-CM | POA: Diagnosis not present

## 2023-08-21 DIAGNOSIS — N1831 Chronic kidney disease, stage 3a: Secondary | ICD-10-CM | POA: Diagnosis not present

## 2023-08-21 DIAGNOSIS — G309 Alzheimer's disease, unspecified: Secondary | ICD-10-CM | POA: Diagnosis not present

## 2023-08-21 DIAGNOSIS — M810 Age-related osteoporosis without current pathological fracture: Secondary | ICD-10-CM | POA: Diagnosis not present

## 2023-08-21 DIAGNOSIS — S065X0D Traumatic subdural hemorrhage without loss of consciousness, subsequent encounter: Secondary | ICD-10-CM | POA: Diagnosis not present

## 2023-08-22 DIAGNOSIS — R55 Syncope and collapse: Secondary | ICD-10-CM | POA: Diagnosis not present

## 2023-08-23 DIAGNOSIS — G309 Alzheimer's disease, unspecified: Secondary | ICD-10-CM | POA: Diagnosis not present

## 2023-08-23 DIAGNOSIS — G8929 Other chronic pain: Secondary | ICD-10-CM | POA: Diagnosis not present

## 2023-08-23 DIAGNOSIS — M79605 Pain in left leg: Secondary | ICD-10-CM | POA: Diagnosis not present

## 2023-08-23 DIAGNOSIS — M810 Age-related osteoporosis without current pathological fracture: Secondary | ICD-10-CM | POA: Diagnosis not present

## 2023-08-23 DIAGNOSIS — M79604 Pain in right leg: Secondary | ICD-10-CM | POA: Diagnosis not present

## 2023-08-23 DIAGNOSIS — I129 Hypertensive chronic kidney disease with stage 1 through stage 4 chronic kidney disease, or unspecified chronic kidney disease: Secondary | ICD-10-CM | POA: Diagnosis not present

## 2023-08-23 DIAGNOSIS — M40209 Unspecified kyphosis, site unspecified: Secondary | ICD-10-CM | POA: Diagnosis not present

## 2023-08-23 DIAGNOSIS — N1831 Chronic kidney disease, stage 3a: Secondary | ICD-10-CM | POA: Diagnosis not present

## 2023-08-23 DIAGNOSIS — E538 Deficiency of other specified B group vitamins: Secondary | ICD-10-CM | POA: Diagnosis not present

## 2023-08-23 DIAGNOSIS — K219 Gastro-esophageal reflux disease without esophagitis: Secondary | ICD-10-CM | POA: Diagnosis not present

## 2023-08-23 DIAGNOSIS — Z9181 History of falling: Secondary | ICD-10-CM | POA: Diagnosis not present

## 2023-08-23 DIAGNOSIS — E785 Hyperlipidemia, unspecified: Secondary | ICD-10-CM | POA: Diagnosis not present

## 2023-08-23 DIAGNOSIS — M545 Low back pain, unspecified: Secondary | ICD-10-CM | POA: Diagnosis not present

## 2023-08-23 DIAGNOSIS — S065X0D Traumatic subdural hemorrhage without loss of consciousness, subsequent encounter: Secondary | ICD-10-CM | POA: Diagnosis not present

## 2023-08-23 DIAGNOSIS — M109 Gout, unspecified: Secondary | ICD-10-CM | POA: Diagnosis not present

## 2023-08-23 DIAGNOSIS — M419 Scoliosis, unspecified: Secondary | ICD-10-CM | POA: Diagnosis not present

## 2023-08-23 DIAGNOSIS — D472 Monoclonal gammopathy: Secondary | ICD-10-CM | POA: Diagnosis not present

## 2023-08-23 DIAGNOSIS — F0280 Dementia in other diseases classified elsewhere without behavioral disturbance: Secondary | ICD-10-CM | POA: Diagnosis not present

## 2023-08-23 DIAGNOSIS — J309 Allergic rhinitis, unspecified: Secondary | ICD-10-CM | POA: Diagnosis not present

## 2023-08-28 DIAGNOSIS — I472 Ventricular tachycardia, unspecified: Secondary | ICD-10-CM | POA: Diagnosis not present

## 2023-08-28 DIAGNOSIS — I441 Atrioventricular block, second degree: Secondary | ICD-10-CM | POA: Diagnosis not present

## 2023-08-28 DIAGNOSIS — I440 Atrioventricular block, first degree: Secondary | ICD-10-CM | POA: Diagnosis not present

## 2023-08-28 DIAGNOSIS — I471 Supraventricular tachycardia, unspecified: Secondary | ICD-10-CM | POA: Diagnosis not present

## 2023-08-28 DIAGNOSIS — I493 Ventricular premature depolarization: Secondary | ICD-10-CM | POA: Diagnosis not present

## 2023-09-05 DIAGNOSIS — M810 Age-related osteoporosis without current pathological fracture: Secondary | ICD-10-CM | POA: Diagnosis not present

## 2023-09-05 DIAGNOSIS — I129 Hypertensive chronic kidney disease with stage 1 through stage 4 chronic kidney disease, or unspecified chronic kidney disease: Secondary | ICD-10-CM | POA: Diagnosis not present

## 2023-09-05 DIAGNOSIS — G309 Alzheimer's disease, unspecified: Secondary | ICD-10-CM | POA: Diagnosis not present

## 2023-09-05 DIAGNOSIS — N1831 Chronic kidney disease, stage 3a: Secondary | ICD-10-CM | POA: Diagnosis not present

## 2023-09-05 DIAGNOSIS — S065X0D Traumatic subdural hemorrhage without loss of consciousness, subsequent encounter: Secondary | ICD-10-CM | POA: Diagnosis not present

## 2023-09-05 DIAGNOSIS — F0280 Dementia in other diseases classified elsewhere without behavioral disturbance: Secondary | ICD-10-CM | POA: Diagnosis not present

## 2023-09-18 DIAGNOSIS — S065X0D Traumatic subdural hemorrhage without loss of consciousness, subsequent encounter: Secondary | ICD-10-CM | POA: Diagnosis not present

## 2023-09-18 DIAGNOSIS — G309 Alzheimer's disease, unspecified: Secondary | ICD-10-CM | POA: Diagnosis not present

## 2023-09-18 DIAGNOSIS — M810 Age-related osteoporosis without current pathological fracture: Secondary | ICD-10-CM | POA: Diagnosis not present

## 2023-09-18 DIAGNOSIS — F0280 Dementia in other diseases classified elsewhere without behavioral disturbance: Secondary | ICD-10-CM | POA: Diagnosis not present

## 2023-09-18 DIAGNOSIS — I129 Hypertensive chronic kidney disease with stage 1 through stage 4 chronic kidney disease, or unspecified chronic kidney disease: Secondary | ICD-10-CM | POA: Diagnosis not present

## 2023-09-18 DIAGNOSIS — N1831 Chronic kidney disease, stage 3a: Secondary | ICD-10-CM | POA: Diagnosis not present

## 2023-10-04 ENCOUNTER — Other Ambulatory Visit: Payer: Self-pay | Admitting: Nurse Practitioner

## 2023-10-04 NOTE — Telephone Encounter (Signed)
Medication Refill - Medication: alendronate (FOSAMAX) 70 MG tablet [[id number]]  Has the patient contacted their pharmacy?   Pharmacy calling     Preferred Pharmacy (with phone number or street name):   [facility] - [place], [place] - [address]  [address] [place] [address]  Phone: [phone] Fax: [phone]  Hours: Not open 24 hours        Has the patient been seen for an appointment in the last year OR does the patient have an upcoming appointment? Yes.    Agent: Please be advised that RX refills may take up to 3 business days. We ask that you follow-up with your pharmacy.

## 2023-10-06 MED ORDER — ALENDRONATE SODIUM 70 MG PO TABS: 70.0000 mg | ORAL_TABLET | ORAL | 3 refills | Status: DC

## 2023-10-06 NOTE — Telephone Encounter (Signed)
Requested Prescriptions  Pending Prescriptions Disp Refills   alendronate (FOSAMAX) 70 MG tablet 4 tablet 3    Sig: Take 1 tablet (70 mg total) by mouth every 7 (seven) days. Take with a full glass of water on an empty stomach.     Endocrinology:  Bisphosphonates Failed - 10/04/2023 12:32 PM      Failed - Cr in normal range and within 360 days    Creatinine  Date Value Ref Range Status  02/24/2023 1.05 (H) 0.44 - 1.00 mg/dL Final         Failed - Mg Level in normal range and within 360 days    Magnesium  Date Value Ref Range Status  09/27/2022 1.9 1.6 - 2.3 mg/dL Final         Failed - Phosphate in normal range and within 360 days    No results found for: "PHOS"       Passed - Ca in normal range and within 360 days    Calcium  Date Value Ref Range Status  02/24/2023 9.1 8.9 - 10.3 mg/dL Final         Passed - Vitamin D in normal range and within 360 days    Vit D, 25-Hydroxy  Date Value Ref Range Status  01/19/2023 36.8 30.0 - 100.0 ng/mL Final    Comment:    Vitamin D deficiency has been defined by the Institute of Medicine and an Endocrine Society practice guideline as a level of serum 25-OH vitamin D less than 20 ng/mL (1,2). The Endocrine Society went on to further define vitamin D insufficiency as a level between 21 and 29 ng/mL (2). 1. IOM (Institute of Medicine). 2010. Dietary reference    intakes for calcium and D. [place]: The    [employer]. 2. Holick MF, [place], Bischoff-Ferrari HA, et al.    Evaluation, treatment, and prevention of vitamin D    deficiency: an Endocrine Society clinical practice    guideline. JCEM. 2011 Jul; 96(7):1911-30.          Passed - eGFR is 30 or above and within 360 days    GFR calc Af Amer  Date Value Ref Range Status  08/12/2020 50 (L) >60 mL/min Final   GFR, Estimated  Date Value Ref Range Status  02/24/2023 52 (L) >60 mL/min Final    Comment:    (NOTE) Calculated using the CKD-EPI Creatinine  Equation (2021)    eGFR  Date Value Ref Range Status  01/19/2023 59 (L) >59 mL/min/1.73 Final         Passed - Valid encounter within last 12 months    Recent Outpatient Visits           1 month ago Alzheimer disease (HCC)   [place] [facility] [place], Corrie Dandy T, NP   2 months ago Alzheimer disease (HCC)   [place] [facility] [place], Corrie Dandy T, NP   8 months ago Alzheimer disease [facility])   [place] [facility] [place], Dorie Rank, NP   11 months ago Memory changes   [place] [facility] [place], Corrie Dandy T, NP   1 year ago Stage 3a chronic kidney disease (HCC)   [place] [facility] [place], Dorie Rank, NP       Future Appointments             In 3 weeks [name], Dorie Rank, NP [place] [facility], [facility]  Passed - Bone Mineral Density or Dexa Scan completed in the last 2 years

## 2023-10-16 ENCOUNTER — Other Ambulatory Visit: Payer: Self-pay | Admitting: Nurse Practitioner

## 2023-10-17 NOTE — Telephone Encounter (Signed)
Requested Prescriptions  Pending Prescriptions Disp Refills   potassium chloride SA (KLOR-CON M20) 20 MEQ tablet [Pharmacy Med Name: POTASSIUM CHLORIDE ER (DISP) TABS 20MEQ] 90 tablet 0    Sig: TAKE 1 TABLET DAILY     Endocrinology:  Minerals - Potassium Supplementation Failed - 10/16/2023  1:48 AM      Failed - Cr in normal range and within 360 days    Creatinine  Date Value Ref Range Status  02/24/2023 1.05 (H) 0.44 - 1.00 mg/dL Final         Passed - K in normal range and within 360 days    Potassium  Date Value Ref Range Status  02/24/2023 3.6 3.5 - 5.1 mmol/L Final         Passed - Valid encounter within last 12 months    Recent Outpatient Visits           2 months ago Alzheimer disease (HCC)   [place] [facility] [place], Corrie Dandy T, NP   2 months ago Alzheimer disease (HCC)   [place] [facility] [place], Corrie Dandy T, NP   9 months ago Alzheimer disease [facility])   [place] [facility] [place], Dorie Rank, NP   12 months ago Memory changes   [place] [facility] [place], Corrie Dandy T, NP   1 year ago Stage 3a chronic kidney disease (HCC)   [place] [facility] [place], Dorie Rank, NP       Future Appointments             In 2 weeks [name], Dorie Rank, NP [place] [facility], [facility]

## 2023-10-29 NOTE — Patient Instructions (Signed)
Influenza (Flu) Vaccine (Inactivated or Recombinant): What You Need to Know Many vaccine information statements are available in Spanish and other languages. See PromoAge.com.br. 1. Why get vaccinated? Influenza vaccine can prevent influenza (flu). Flu is a contagious disease that spreads around the [place] every year, usually between October and May. Anyone can get the flu, but it is more dangerous for some people. Infants and young children, people 58 years and older, pregnant people, and people with certain health conditions or a weakened immune system are at greatest risk of flu complications. Pneumonia, bronchitis, sinus infections, and ear infections are examples of flu-related complications. If you have a medical condition, such as heart disease, cancer, or diabetes, flu can make it worse. Flu can cause fever and chills, sore throat, muscle aches, fatigue, cough, headache, and runny or stuffy nose. Some people may have vomiting and diarrhea, though this is more common in children than adults. In an average year, thousands of people in the [place] die from flu, and many more are hospitalized. Flu vaccine prevents millions of illnesses and flu-related visits to the doctor each year. 2. Influenza vaccines CDC recommends everyone 6 months and older get vaccinated every flu season. Children 6 months through 51 years of age may need 2 doses during a single flu season. Everyone else needs only 1 dose each flu season. It takes about 2 weeks for protection to develop after vaccination. There are many flu viruses, and they are always changing. Each year a new flu vaccine is made to protect against the influenza viruses believed to be likely to cause disease in the upcoming flu season. Even when the vaccine doesn't exactly match these viruses, it may still provide some protection. Influenza vaccine does not cause flu. Influenza vaccine may be given at the same time as other vaccines. 3.  Talk with your health care provider Tell your vaccination provider if the person getting the vaccine: Has had an allergic reaction after a previous dose of influenza vaccine, or has any severe, life-threatening allergies Has ever had Guillain-Barr Syndrome (also called "GBS") In some cases, your health care provider may decide to postpone influenza vaccination until a future visit. Influenza vaccine can be administered at any time during pregnancy. People who are or will be pregnant during influenza season should receive inactivated influenza vaccine. People with minor illnesses, such as a cold, may be vaccinated. People who are moderately or severely ill should usually wait until they recover before getting influenza vaccine. Your health care provider can give you more information. 4. Risks of a vaccine reaction Soreness, redness, and swelling where the shot is given, fever, muscle aches, and headache can happen after influenza vaccination. There may be a very small increased risk of Guillain-Barr Syndrome (GBS) after inactivated influenza vaccine (the flu shot). Young children who get the flu shot along with pneumococcal vaccine (PCV13) and/or DTaP vaccine at the same time might be slightly more likely to have a seizure caused by fever. Tell your health care provider if a child who is getting flu vaccine has ever had a seizure. People sometimes faint after medical procedures, including vaccination. Tell your provider if you feel dizzy or have vision changes or ringing in the ears. As with any medicine, there is a very remote chance of a vaccine causing a severe allergic reaction, other serious injury, or death. 5. What if there is a serious problem? An allergic reaction could occur after the vaccinated person leaves the clinic. If you see signs of  a severe allergic reaction (hives, swelling of the face and throat, difficulty breathing, a fast heartbeat, dizziness, or weakness), call 9-1-1 and get  the person to the nearest hospital. For other signs that concern you, call your health care provider. Adverse reactions should be reported to the Vaccine Adverse Event Reporting System (VAERS). Your health care provider will usually file this report, or you can do it yourself. Visit the VAERS website at www.vaers.LAgents.no or call (774)365-3656. VAERS is only for reporting reactions, and VAERS staff members do not give medical advice. 6. The National Vaccine Injury Compensation Program The [employer] Vaccine Injury Compensation Program (VICP) is a federal program that was created to compensate people who may have been injured by certain vaccines. Claims regarding alleged injury or death due to vaccination have a time limit for filing, which may be as short as two years. Visit the VICP website at SpiritualWord.at or call 3187036210 to learn about the program and about filing a claim. 7. How can I learn more? Ask your health care provider. Call your local or state health department. Visit the website of the Food and Drug Administration (FDA) for vaccine package inserts and additional information at FinderList.no. Contact the Centers for Disease Control and Prevention (CDC): Call 787-694-5930 (1-800-CDC-INFO) or Visit CDC's website at BiotechRoom.com.cy. Source: CDC Vaccine Information Statement Inactivated Influenza Vaccine (07/17/2020) This same material is available at FootballExhibition.com.br for no charge. This information is not intended to replace advice given to you by your health care provider. Make sure you discuss any questions you have with your health care provider. Document Revised: 03/15/2023 Document Reviewed: 12/19/2022 Elsevier Patient Education  2024 [employer].

## 2023-11-01 ENCOUNTER — Ambulatory Visit (INDEPENDENT_AMBULATORY_CARE_PROVIDER_SITE_OTHER): Payer: Medicare Other | Admitting: Nurse Practitioner

## 2023-11-01 ENCOUNTER — Encounter: Payer: Self-pay | Admitting: Nurse Practitioner

## 2023-11-01 VITALS — BP 128/67 | HR 81 | Temp 97.7°F | Ht 62.0 in | Wt 154.6 lb

## 2023-11-01 DIAGNOSIS — I700 Atherosclerosis of aorta: Secondary | ICD-10-CM | POA: Diagnosis not present

## 2023-11-01 DIAGNOSIS — D472 Monoclonal gammopathy: Secondary | ICD-10-CM

## 2023-11-01 DIAGNOSIS — F0280 Dementia in other diseases classified elsewhere without behavioral disturbance: Secondary | ICD-10-CM | POA: Diagnosis not present

## 2023-11-01 DIAGNOSIS — G309 Alzheimer's disease, unspecified: Secondary | ICD-10-CM

## 2023-11-01 DIAGNOSIS — N1831 Chronic kidney disease, stage 3a: Secondary | ICD-10-CM

## 2023-11-01 DIAGNOSIS — M810 Age-related osteoporosis without current pathological fracture: Secondary | ICD-10-CM | POA: Diagnosis not present

## 2023-11-01 DIAGNOSIS — I129 Hypertensive chronic kidney disease with stage 1 through stage 4 chronic kidney disease, or unspecified chronic kidney disease: Secondary | ICD-10-CM

## 2023-11-01 DIAGNOSIS — Z23 Encounter for immunization: Secondary | ICD-10-CM

## 2023-11-01 NOTE — Assessment & Plan Note (Signed)
Chronic, progressive.  Continue Aricept daily and collaboration with neurology.  Recent notes reviewed. Continue OT and PT as needed, she does well with this.

## 2023-11-01 NOTE — Assessment & Plan Note (Signed)
Chronic, stable.  BP below goal in office today.  Continue current medication regimen and adjust as needed -- could consider reduction of medication in future if BP remains stable.  LABS: up to date.  Renal dose medications as needed.  Recommend she continue to monitor BP at home at least a few days a week and document + focus on DASH diet.

## 2023-11-01 NOTE — Progress Notes (Signed)
BP 128/67   Pulse 81   Temp 97.7 F (36.5 C) (Oral)   Ht 5\' 2"  (1.575 m)   Wt 154 lb 9.6 oz (70.1 kg)   LMP  (LMP Unknown)   SpO2 98%   BMI 28.28 kg/m    Subjective:    Patient ID: Cynthia Hurst, female    DOB: 1935/01/03, 88 y.o.   MRN: [id number]  HPI: Cynthia Hurst is a 88 y.o. female  Chief Complaint  Patient presents with   Dementia   Hyperlipidemia   Hypertension   Daughter present at bedside with her.  OSTEOPENIA Previous osteopenia and recent DEXA 06/13/22 noted osteoporosis.  Continues on Fosamax.  The BMD measured at Forearm Radius 33% is 0.600 g/cm2 with a T-score of -3.2. No recent falls.  Last fall on 07/21/23 and Atenolol was held at time.  No walker or cane used. Past osteoporosis medications/treatments: as above Adequate calcium & vitamin D: yes Weight bearing exercises: yes   MGUS: Last visit with Dr. Cathie Hoops was on 03/02/23 -- no changes made, continue annual visits.  Had renal ultrasound on 02/14/2020 with normal findings, right-sided renal cysts measuring 1.8 cm and questionable mild bladder wall thickening. Denies any B symptoms at this time.    DEMENTIA + HTN Saw neurology 06/26/23, Alzheimer's Dementia.  Continues on Aricept 10 MG at bedtime.  Continues to live with her husband, her children check on them frequently.  They are limiting their driving.  They have Meals on Wheels, but she is picky and at times does not like what they bring her. Eats breakfast and dinner, then in between she snacks.  No incontinence issues present. Occasionally missing medication doses per daughter.   Her daughter reports some family history in parents of dementia.   Satisfied with current treatment? yes Duration of hypertension: chronic BP monitoring frequency: monthly BP range: unsure BP medication side effects: no Aspirin: no Recent stressors: no Recurrent headaches: no Visual changes: no Palpitations: no Dyspnea: no Chest pain: no Lower extremity edema:  no Dizzy/lightheaded: no     09/27/2022   10:01 AM  MMSE - Mini Mental State Exam  Orientation to time 1  Orientation to Place 5  Registration 3  Attention/ Calculation 4  Recall 0  Language- name 2 objects 2  Language- repeat 1  Language- follow 3 step command 3  Language- read & follow direction 1  Write a sentence 1  Copy design 1  Total score 22       08/15/2023   10:57 AM 07/24/2023   11:49 AM 08/04/2022   12:37 PM 08/02/2021   11:22 AM 07/27/2020   11:25 AM  6CIT Screen  What Year? 4 points 4 points 4 points 0 points 0 points  What month? 0 points 0 points 0 points 0 points 0 points  What time? 0 points 0 points 0 points 0 points 0 points  Count back from 20 0 points 0 points 0 points 0 points 0 points  Months in reverse 4 points 4 points 2 points 2 points 4 points  Repeat phrase 10 points 8 points 10 points 2 points 0 points  Total Score 18 points 16 points 16 points 4 points 4 points   CHRONIC KIDNEY DISEASE Recent labs continue to show stable CKD 3a.   CKD status: controlled Medications renally dose: yes Previous renal evaluation: no Pneumovax: Up To Date Influenza Vaccine:  Up To Date  Relevant past medical, surgical, family and social history reviewed  and updated as indicated. Interim medical history since our last visit reviewed. Allergies and medications reviewed and updated.  Review of Systems  Constitutional:  Negative for activity change, appetite change, diaphoresis, fatigue and fever.  Respiratory:  Negative for cough, chest tightness and shortness of breath.   Cardiovascular:  Negative for chest pain, palpitations and leg swelling.  Gastrointestinal: Negative.   Musculoskeletal:  Negative for back pain.  Neurological: Negative.   Psychiatric/Behavioral: Negative.     Per HPI unless specifically indicated above     Objective:    BP 128/67   Pulse 81   Temp 97.7 F (36.5 C) (Oral)   Ht 5\' 2"  (1.575 m)   Wt 154 lb 9.6 oz (70.1 kg)   LMP   (LMP Unknown)   SpO2 98%   BMI 28.28 kg/m   Wt Readings from Last 3 Encounters:  11/01/23 154 lb 9.6 oz (70.1 kg)  08/15/23 156 lb (70.8 kg)  08/09/23 156 lb 6.4 oz (70.9 kg)    Physical Exam Vitals and nursing note reviewed.  Constitutional:      General: She is awake. She is not in acute distress.    Appearance: Normal appearance. She is well-developed and well-groomed. She is not ill-appearing or toxic-appearing.  HENT:     Head: Normocephalic.     Right Ear: Hearing and external ear normal.     Left Ear: Hearing and external ear normal.  Eyes:     General: Lids are normal.        Right eye: No discharge.        Left eye: No discharge.     Conjunctiva/sclera: Conjunctivae normal.     Pupils: Pupils are equal, round, and reactive to light.  Neck:     Thyroid: No thyromegaly.     Vascular: No carotid bruit.  Cardiovascular:     Rate and Rhythm: Normal rate and regular rhythm.     Heart sounds: Normal heart sounds. No murmur heard.    No gallop.  Pulmonary:     Effort: Pulmonary effort is normal. No accessory muscle usage or respiratory distress.     Breath sounds: Normal breath sounds.  Abdominal:     General: Bowel sounds are normal. There is no distension.     Palpations: Abdomen is soft.     Tenderness: There is no abdominal tenderness.  Musculoskeletal:     Cervical back: Normal range of motion and neck supple.     Right lower leg: No edema.     Left lower leg: No edema.     Comments: Kyphosis noted.  Lymphadenopathy:     Cervical: No cervical adenopathy.  Skin:    General: Skin is warm and dry.  Neurological:     Mental Status: She is alert.     Deep Tendon Reflexes: Reflexes are normal and symmetric.     Reflex Scores:      Brachioradialis reflexes are 2+ on the right side and 2+ on the left side.      Patellar reflexes are 2+ on the right side and 2+ on the left side.    Comments: Pleasantly confused.  Able to report month, but not day and year.  Unable to  state who President is.  Psychiatric:        Attention and Perception: Attention normal.        Mood and Affect: Mood normal.        Speech: Speech normal.  Behavior: Behavior normal. Behavior is cooperative.        Thought Content: Thought content normal.        Cognition and Memory: Memory is impaired.     Results for orders placed or performed in visit on 02/24/23  CMP (Cancer Center only)  Result Value Ref Range   Sodium 138 135 - 145 mmol/L   Potassium 3.6 3.5 - 5.1 mmol/L   Chloride 106 98 - 111 mmol/L   CO2 25 22 - 32 mmol/L   Glucose, Bld 165 (H) 70 - 99 mg/dL   BUN 14 8 - 23 mg/dL   Creatinine 7.62 (H) 8.31 - 1.00 mg/dL   Calcium 9.1 8.9 - 51.7 mg/dL   Total Protein 6.9 6.5 - 8.1 g/dL   Albumin 3.6 3.5 - 5.0 g/dL   AST 29 15 - 41 U/L   ALT 9 0 - 44 U/L   Alkaline Phosphatase 61 38 - 126 U/L   Total Bilirubin 0.8 0.3 - 1.2 mg/dL   GFR, Estimated 52 (L) >60 mL/min   Anion gap 7 5 - 15  CBC with Differential (Cancer Center Only)  Result Value Ref Range   WBC Count 5.3 4.0 - 10.5 K/uL   RBC 3.89 3.87 - 5.11 MIL/uL   Hemoglobin 12.0 12.0 - 15.0 g/dL   HCT 61.6 07.3 - 71.0 %   MCV 93.1 80.0 - 100.0 fL   MCH 30.8 26.0 - 34.0 pg   MCHC 33.1 30.0 - 36.0 g/dL   RDW 62.6 94.8 - 54.6 %   Platelet Count 148 (L) 150 - 400 K/uL   nRBC 0.0 0.0 - 0.2 %   Neutrophils Relative % 64 %   Neutro Abs 3.4 1.7 - 7.7 K/uL   Lymphocytes Relative 25 %   Lymphs Abs 1.3 0.7 - 4.0 K/uL   Monocytes Relative 9 %   Monocytes Absolute 0.5 0.1 - 1.0 K/uL   Eosinophils Relative 2 %   Eosinophils Absolute 0.1 0.0 - 0.5 K/uL   Basophils Relative 0 %   Basophils Absolute 0.0 0.0 - 0.1 K/uL   Immature Granulocytes 0 %   Abs Immature Granulocytes 0.01 0.00 - 0.07 K/uL  Kappa/lambda light chains  Result Value Ref Range   Kappa free light chain 31.7 (H) 3.3 - 19.4 mg/L   Lambda free light chains 23.0 5.7 - 26.3 mg/L   Kappa, lambda light chain ratio 1.38 0.26 - 1.65  Multiple Myeloma  Panel (SPEP&IFE w/QIG)  Result Value Ref Range   IgG (Immunoglobin G), Serum 1,156 586 - 1,602 mg/dL   IgA 270 64 - 350 mg/dL   IgM (Immunoglobulin M), Srm 230 (H) 26 - 217 mg/dL   Total Protein ELP 6.2 6.0 - 8.5 g/dL   Albumin SerPl Elph-Mcnc 3.7 2.9 - 4.4 g/dL   Alpha 1 0.3 0.0 - 0.4 g/dL   Alpha2 Glob SerPl Elph-Mcnc 0.4 0.4 - 1.0 g/dL   B-Globulin SerPl Elph-Mcnc 0.7 0.7 - 1.3 g/dL   Gamma Glob SerPl Elph-Mcnc 1.1 0.4 - 1.8 g/dL   M Protein SerPl Elph-Mcnc Not Observed Not Observed g/dL   Globulin, Total 2.5 2.2 - 3.9 g/dL   Albumin/Glob SerPl 1.5 0.7 - 1.7   IFE 1 Comment (A)    Please Note Comment       Assessment & Plan:   Problem List Items Addressed This Visit       Cardiovascular and Mediastinum   Aortic atherosclerosis (HCC) (Chronic)    Chronic. Noted  on imaging on 03/22/2018.  At this time will not initiate statin due to advanced age and patient wishes.  Avoid ASA at this time due to fall risk.      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  BP below goal in office today.  Continue current medication regimen and adjust as needed -- could consider reduction of medication in future if BP remains stable.  LABS: up to date.  Renal dose medications as needed.  Recommend she continue to monitor BP at home at least a few days a week and document + focus on DASH diet.          Nervous and Auditory   Alzheimer disease (HCC) - Primary    Chronic, progressive.  Continue Aricept daily and collaboration with neurology.  Recent notes reviewed. Continue OT and PT as needed, she does well with this.        Musculoskeletal and Integument   Age related osteoporosis (Chronic)    Ongoing.  Will continue Fosamax at this time and plan on repeat DEXA in 2 years, 2025. One recent fall with no fractures, but subdural hematoma present - August 2024.  Continue supplements.          Genitourinary   CKD (chronic kidney disease), stage III (HCC)  (Chronic)    Chronic with CKD 3a.  Monitor BMP/CMP every 6 months and refer to nephrology as needed if worsening CKD.  Renal dose medications as needed.          Other   MGUS (monoclonal gammopathy of unknown significance) (Chronic)    Chronic, ongoing.  Continue collaboration with oncology, recent note reviewed.      Other Visit Diagnoses     Flu vaccine need       Flu vaccine today, educated family.         Follow up plan: Return in about 4 months (around 02/29/2024) for HTN, MEMORY CHANGES, MGUS, OSTEOPOROSIS.

## 2023-11-01 NOTE — Assessment & Plan Note (Signed)
Ongoing.  Will continue Fosamax at this time and plan on repeat DEXA in 2 years, 2025. One recent fall with no fractures, but subdural hematoma present - August 2024.  Continue supplements.

## 2023-11-01 NOTE — Assessment & Plan Note (Signed)
Chronic, ongoing.  Continue collaboration with oncology, recent note reviewed. 

## 2023-11-01 NOTE — Assessment & Plan Note (Signed)
Chronic with CKD 3a.  Monitor BMP/CMP every 6 months and refer to nephrology as needed if worsening CKD.  Renal dose medications as needed.

## 2023-11-01 NOTE — Assessment & Plan Note (Addendum)
Chronic. Noted on imaging on 03/22/2018.  At this time will not initiate statin due to advanced age and patient wishes.  Avoid ASA at this time due to fall risk.

## 2023-11-03 ENCOUNTER — Other Ambulatory Visit: Payer: Self-pay | Admitting: Nurse Practitioner

## 2023-11-03 NOTE — Telephone Encounter (Signed)
Requested medication (s) are due for refill today: Yes  Requested medication (s) are on the active medication list: Yes  Last refill:  09/27/22  Future visit scheduled: Yes  Notes to clinic:  Prescription has expired.    Requested Prescriptions  Pending Prescriptions Disp Refills   NIFEdipine (ADALAT CC) 60 MG 24 hr tablet [Pharmacy Med Name: NIFEDIPINE ER (CC) TABS 60MG ] 90 tablet 3    Sig: TAKE 1 TABLET DAILY     Cardiovascular: Calcium Channel Blockers 2 Passed - 11/03/2023  1:31 AM      Passed - Last BP in normal range    BP Readings from Last 1 Encounters:  11/01/23 128/67         Passed - Last Heart Rate in normal range    Pulse Readings from Last 1 Encounters:  11/01/23 81         Passed - Valid encounter within last 6 months    Recent Outpatient Visits           2 days ago Alzheimer disease (HCC)   [place] [facility] [place], Corrie Dandy T, NP   2 months ago Alzheimer disease (HCC)   [place] [facility] [place], Corrie Dandy T, NP   3 months ago Alzheimer disease (HCC)   [place] [facility] [place], Corrie Dandy T, NP   9 months ago Alzheimer disease [facility])   [place] [facility] [place], Corrie Dandy T, NP   1 year ago Memory changes   [place] [facility] [place], Dorie Rank, NP       Future Appointments             In 3 months [name], Dorie Rank, NP  [facility], [facility]

## 2023-11-21 ENCOUNTER — Ambulatory Visit: Payer: Self-pay

## 2023-11-21 NOTE — Telephone Encounter (Signed)
Noted  

## 2023-11-21 NOTE — Telephone Encounter (Signed)
  Chief Complaint: Left side pain Symptoms: pain with certain movements. Frequency: 3 days Pertinent Negatives: Patient denies fever, UTI s/s Disposition: [] ED /[] Urgent Care (no appt availability in office) / [x] Appointment(In office/virtual)/ []  [facility]/ [] Home Care/ [] Refused Recommended Disposition /[] [facility]/ []  Follow-up with PCP Additional Notes: Call from pt and her daughter Misty Stanley. Pt has had side pain for a few days(3?). Pain is sharp with some movements. Pt has been moving things around and may have pulled something. Pain does sound muscular in nature. Appt for tomorrow afternoon scheduled. Pt will  try Tylenol and heating pad today.   Reason for Disposition  [1] MODERATE pain (e.g., interferes with normal activities) AND [2] present > 3 days  Answer Assessment - Initial Assessment Questions 1. ONSET: "When did the muscle aches or body pains start?"      2 days 2. LOCATION: "What part of your body is hurting?" (e.g., entire body, arms, legs)      Left side 3. SEVERITY: "How bad is the pain?" (Scale 1-10; or mild, moderate, severe)   - MILD (1-3): doesn't interfere with normal activities    - MODERATE (4-7): interferes with normal activities or awakens from sleep    - SEVERE (8-10):  excruciating pain, unable to do any normal activities      5/10 4. CAUSE: "What do you think is causing the pains?"     unsure 5. FEVER: "Have you been having fever?"     no 6. OTHER SYMPTOMS: "Do you have any other symptoms?" (e.g., chest pain, weakness, rash, cold or flu symptoms, weight loss)     no  Protocols used: Muscle Aches and Body Pain-A-AH

## 2023-11-22 ENCOUNTER — Ambulatory Visit (INDEPENDENT_AMBULATORY_CARE_PROVIDER_SITE_OTHER): Payer: Medicare Other | Admitting: Nurse Practitioner

## 2023-11-22 ENCOUNTER — Encounter: Payer: Self-pay | Admitting: Nurse Practitioner

## 2023-11-22 VITALS — BP 139/78 | HR 86 | Temp 97.6°F | Ht 62.0 in | Wt 152.8 lb

## 2023-11-22 DIAGNOSIS — M545 Low back pain, unspecified: Secondary | ICD-10-CM

## 2023-11-22 LAB — URINALYSIS, ROUTINE W REFLEX MICROSCOPIC
Bilirubin, UA: NEGATIVE
Glucose, UA: NEGATIVE
Ketones, UA: NEGATIVE
Leukocytes,UA: NEGATIVE
Nitrite, UA: NEGATIVE
Protein,UA: NEGATIVE
RBC, UA: NEGATIVE
Specific Gravity, UA: 1.015 (ref 1.005–1.030)
Urobilinogen, Ur: 0.2 mg/dL (ref 0.2–1.0)
pH, UA: 6 (ref 5.0–7.5)

## 2023-11-22 MED ORDER — LIDOCAINE 3.5 % EX PTCH: 1.0000 | MEDICATED_PATCH | Freq: Every day | CUTANEOUS | 1 refills | Status: AC

## 2023-11-22 NOTE — Progress Notes (Unsigned)
BP 139/78 (BP Location: Left Arm, Patient Position: Sitting, Cuff Size: Large)   Pulse 86   Temp 97.6 F (36.4 C) (Oral)   Ht 5\' 2"  (1.575 m)   Wt 152 lb 12.8 oz (69.3 kg)   LMP  (LMP Unknown)   SpO2 94%   BMI 27.95 kg/m    Subjective:    Patient ID: Cynthia Hurst, female    DOB: 10-18-1935, 88 y.o.   MRN: [id number]  HPI: Cynthia Hurst is a 88 y.o. female  Chief Complaint  Patient presents with   Pain    Left side, has been present for 3 days, has become worse over tim, prior treatment has been tylenol   LEFT SIDED PAIN Patient states she has been having some left sided pain for the last 3 days.  She doesn't remember hurting it.  She denies moving or lifting anything.  She has been using the bathroom regularly.  Not able to rate her pain.  She had pain when she put her seat belt across her body. She states it is a sharp pain.    Relevant past medical, surgical, family and social history reviewed and updated as indicated. Interim medical history since our last visit reviewed. Allergies and medications reviewed and updated.  Review of Systems  Gastrointestinal:  Negative for constipation.  Genitourinary:  Negative for decreased urine volume and dysuria.  Musculoskeletal:        Left side pain    Per HPI unless specifically indicated above     Objective:    BP 139/78 (BP Location: Left Arm, Patient Position: Sitting, Cuff Size: Large)   Pulse 86   Temp 97.6 F (36.4 C) (Oral)   Ht 5\' 2"  (1.575 m)   Wt 152 lb 12.8 oz (69.3 kg)   LMP  (LMP Unknown)   SpO2 94%   BMI 27.95 kg/m   Wt Readings from Last 3 Encounters:  11/22/23 152 lb 12.8 oz (69.3 kg)  11/01/23 154 lb 9.6 oz (70.1 kg)  08/15/23 156 lb (70.8 kg)    Physical Exam Vitals and nursing note reviewed.  Constitutional:      General: She is not in acute distress.    Appearance: Normal appearance. She is normal weight. She is not ill-appearing, toxic-appearing or diaphoretic.  HENT:     Head:  Normocephalic.     Right Ear: External ear normal.     Left Ear: External ear normal.     Nose: Nose normal.     Mouth/Throat:     Mouth: Mucous membranes are moist.     Pharynx: Oropharynx is clear.  Eyes:     General:        Right eye: No discharge.        Left eye: No discharge.     Extraocular Movements: Extraocular movements intact.     Conjunctiva/sclera: Conjunctivae normal.     Pupils: Pupils are equal, round, and reactive to light.  Cardiovascular:     Rate and Rhythm: Normal rate and regular rhythm.     Heart sounds: No murmur heard. Pulmonary:     Effort: Pulmonary effort is normal. No respiratory distress.     Breath sounds: Normal breath sounds. No wheezing or rales.  Musculoskeletal:     Cervical back: Normal range of motion and neck supple.     Thoracic back: Spasms and tenderness present. No swelling, edema, deformity, signs of trauma, lacerations or bony tenderness. Normal range of motion. No scoliosis.  Comments: scoliosis  Skin:    General: Skin is warm and dry.     Capillary Refill: Capillary refill takes less than 2 seconds.  Neurological:     General: No focal deficit present.     Mental Status: She is alert and oriented to person, place, and time. Mental status is at baseline.  Psychiatric:        Mood and Affect: Mood normal.        Behavior: Behavior normal.        Thought Content: Thought content normal.        Judgment: Judgment normal.     Results for orders placed or performed in visit on 02/24/23  CMP (Cancer Center only)  Result Value Ref Range   Sodium 138 135 - 145 mmol/L   Potassium 3.6 3.5 - 5.1 mmol/L   Chloride 106 98 - 111 mmol/L   CO2 25 22 - 32 mmol/L   Glucose, Bld 165 (H) 70 - 99 mg/dL   BUN 14 8 - 23 mg/dL   Creatinine 0.62 (H) 3.76 - 1.00 mg/dL   Calcium 9.1 8.9 - 28.3 mg/dL   Total Protein 6.9 6.5 - 8.1 g/dL   Albumin 3.6 3.5 - 5.0 g/dL   AST 29 15 - 41 U/L   ALT 9 0 - 44 U/L   Alkaline Phosphatase 61 38 - 126 U/L    Total Bilirubin 0.8 0.3 - 1.2 mg/dL   GFR, Estimated 52 (L) >60 mL/min   Anion gap 7 5 - 15  CBC with Differential (Cancer Center Only)  Result Value Ref Range   WBC Count 5.3 4.0 - 10.5 K/uL   RBC 3.89 3.87 - 5.11 MIL/uL   Hemoglobin 12.0 12.0 - 15.0 g/dL   HCT 15.1 76.1 - 60.7 %   MCV 93.1 80.0 - 100.0 fL   MCH 30.8 26.0 - 34.0 pg   MCHC 33.1 30.0 - 36.0 g/dL   RDW 37.1 06.2 - 69.4 %   Platelet Count 148 (L) 150 - 400 K/uL   nRBC 0.0 0.0 - 0.2 %   Neutrophils Relative % 64 %   Neutro Abs 3.4 1.7 - 7.7 K/uL   Lymphocytes Relative 25 %   Lymphs Abs 1.3 0.7 - 4.0 K/uL   Monocytes Relative 9 %   Monocytes Absolute 0.5 0.1 - 1.0 K/uL   Eosinophils Relative 2 %   Eosinophils Absolute 0.1 0.0 - 0.5 K/uL   Basophils Relative 0 %   Basophils Absolute 0.0 0.0 - 0.1 K/uL   Immature Granulocytes 0 %   Abs Immature Granulocytes 0.01 0.00 - 0.07 K/uL  Kappa/lambda light chains  Result Value Ref Range   Kappa free light chain 31.7 (H) 3.3 - 19.4 mg/L   Lambda free light chains 23.0 5.7 - 26.3 mg/L   Kappa, lambda light chain ratio 1.38 0.26 - 1.65  Multiple Myeloma Panel (SPEP&IFE w/QIG)  Result Value Ref Range   IgG (Immunoglobin G), Serum 1,156 586 - 1,602 mg/dL   IgA 854 64 - 627 mg/dL   IgM (Immunoglobulin M), Srm 230 (H) 26 - 217 mg/dL   Total Protein ELP 6.2 6.0 - 8.5 g/dL   Albumin SerPl Elph-Mcnc 3.7 2.9 - 4.4 g/dL   Alpha 1 0.3 0.0 - 0.4 g/dL   Alpha2 Glob SerPl Elph-Mcnc 0.4 0.4 - 1.0 g/dL   B-Globulin SerPl Elph-Mcnc 0.7 0.7 - 1.3 g/dL   Gamma Glob SerPl Elph-Mcnc 1.1 0.4 - 1.8 g/dL   M Protein  SerPl Elph-Mcnc Not Observed Not Observed g/dL   Globulin, Total 2.5 2.2 - 3.9 g/dL   Albumin/Glob SerPl 1.5 0.7 - 1.7   IFE 1 Comment (A)    Please Note Comment       Assessment & Plan:   Problem List Items Addressed This Visit   None Visit Diagnoses     Acute left-sided low back pain without sciatica    -  Primary   UA neg in office. Recommend using Tylenol PRN and  Lidocaine patches.  If not improved next week will order xray for evaluation.   Relevant Orders   Urinalysis, Routine w reflex microscopic        Follow up plan: No follow-ups on file.

## 2023-11-28 ENCOUNTER — Ambulatory Visit
Admission: RE | Admit: 2023-11-28 | Discharge: 2023-11-28 | Disposition: A | Discharge status: Home / Self Care | Payer: Medicare Other | Source: Ambulatory Visit | Attending: Nurse Practitioner | Admitting: Nurse Practitioner

## 2023-11-28 ENCOUNTER — Telehealth: Payer: Self-pay | Admitting: Nurse Practitioner

## 2023-11-28 ENCOUNTER — Ambulatory Visit
Admission: RE | Admit: 2023-11-28 | Discharge: 2023-11-28 | Disposition: A | Discharge status: Home / Self Care | Payer: Medicare Other | Attending: Nurse Practitioner | Admitting: Nurse Practitioner

## 2023-11-28 DIAGNOSIS — M545 Low back pain, unspecified: Secondary | ICD-10-CM

## 2023-11-28 DIAGNOSIS — M5136 Other intervertebral disc degeneration, lumbar region with discogenic back pain only: Secondary | ICD-10-CM | POA: Diagnosis not present

## 2023-11-28 DIAGNOSIS — M5126 Other intervertebral disc displacement, lumbar region: Secondary | ICD-10-CM | POA: Diagnosis not present

## 2023-11-28 DIAGNOSIS — M47816 Spondylosis without myelopathy or radiculopathy, lumbar region: Secondary | ICD-10-CM | POA: Diagnosis not present

## 2023-11-28 DIAGNOSIS — M4319 Spondylolisthesis, multiple sites in spine: Secondary | ICD-10-CM | POA: Diagnosis not present

## 2023-11-28 NOTE — Telephone Encounter (Signed)
Spoke with patient daughter Misty Stanley and made her aware of Larae Grooms, NP recommendations. Patient daughter verbalized understanding.

## 2023-11-28 NOTE — Telephone Encounter (Signed)
Patients daughter called stated her mother is still in great pain and need an xray to see what may be causing the pain. Please f/u with daughter

## 2023-11-28 NOTE — Telephone Encounter (Signed)
Pt was seen in office for low back pain on 11/27/2023.

## 2023-11-28 NOTE — Telephone Encounter (Signed)
I ordered the xray.  She can walk into the [facility] office and get the xray done.  I will follow up with them once the results have been read.

## 2023-11-29 ENCOUNTER — Encounter: Payer: Self-pay | Admitting: Nurse Practitioner

## 2023-11-29 DIAGNOSIS — M545 Low back pain, unspecified: Secondary | ICD-10-CM

## 2023-11-30 ENCOUNTER — Other Ambulatory Visit: Payer: Self-pay | Admitting: Nurse Practitioner

## 2023-12-01 NOTE — Telephone Encounter (Signed)
Requested Prescriptions  Pending Prescriptions Disp Refills   donepezil (ARICEPT) 10 MG tablet [Pharmacy Med Name: Donepezil HCl 10 MG Oral Tablet] 90 tablet 0    Sig: TAKE 1 TABLET BY MOUTH AT BEDTIME     Neurology:  Alzheimer's Agents Passed - 12/01/2023 10:20 AM      Passed - Valid encounter within last 6 months    Recent Outpatient Visits           1 week ago Acute left-sided low back pain without sciatica   [place] [facility] Larae Grooms, NP   1 month ago Alzheimer disease [facility])   [place] [facility] [place], Corrie Dandy T, NP   3 months ago Alzheimer disease [facility])   [place] [facility] [place], Corrie Dandy T, NP   4 months ago Alzheimer disease [facility])   [place] [facility] [place], Corrie Dandy T, NP   10 months ago Alzheimer disease [facility])   [place] [facility] [place], Dorie Rank, NP       Future Appointments             In 3 months [name], Dorie Rank, NP [place] [facility], [facility]

## 2023-12-05 DIAGNOSIS — R0781 Pleurodynia: Secondary | ICD-10-CM | POA: Diagnosis not present

## 2023-12-05 DIAGNOSIS — I129 Hypertensive chronic kidney disease with stage 1 through stage 4 chronic kidney disease, or unspecified chronic kidney disease: Secondary | ICD-10-CM | POA: Diagnosis not present

## 2023-12-05 DIAGNOSIS — F0390 Unspecified dementia without behavioral disturbance: Secondary | ICD-10-CM | POA: Diagnosis not present

## 2023-12-05 DIAGNOSIS — N183 Chronic kidney disease, stage 3 unspecified: Secondary | ICD-10-CM | POA: Diagnosis not present

## 2023-12-05 DIAGNOSIS — M109 Gout, unspecified: Secondary | ICD-10-CM | POA: Diagnosis not present

## 2023-12-05 DIAGNOSIS — R0789 Other chest pain: Secondary | ICD-10-CM | POA: Diagnosis not present

## 2023-12-05 DIAGNOSIS — Z79899 Other long term (current) drug therapy: Secondary | ICD-10-CM | POA: Diagnosis not present

## 2023-12-18 ENCOUNTER — Encounter: Payer: Self-pay | Admitting: Nurse Practitioner

## 2023-12-27 DIAGNOSIS — S39012A Strain of muscle, fascia and tendon of lower back, initial encounter: Secondary | ICD-10-CM | POA: Diagnosis not present

## 2024-01-01 DIAGNOSIS — S39012A Strain of muscle, fascia and tendon of lower back, initial encounter: Secondary | ICD-10-CM | POA: Diagnosis not present

## 2024-01-08 DIAGNOSIS — S39012A Strain of muscle, fascia and tendon of lower back, initial encounter: Secondary | ICD-10-CM | POA: Diagnosis not present

## 2024-01-10 DIAGNOSIS — S39012A Strain of muscle, fascia and tendon of lower back, initial encounter: Secondary | ICD-10-CM | POA: Diagnosis not present

## 2024-01-12 ENCOUNTER — Other Ambulatory Visit: Payer: Self-pay | Admitting: Nurse Practitioner

## 2024-01-12 NOTE — Telephone Encounter (Signed)
Requested medication (s) are due for refill today: Yes  Requested medication (s) are on the active medication list: Yes  Last refill:  10/16/24  Future visit scheduled: Yes  Notes to clinic:  Pharmacy requests multiple refills.    Requested Prescriptions  Pending Prescriptions Disp Refills   KLOR-CON M20 20 MEQ tablet [Pharmacy Med Name: POTASSIUM CHLORIDE ER (DISP) TABS 20MEQ] 90 tablet 3    Sig: TAKE 1 TABLET DAILY     Endocrinology:  Minerals - Potassium Supplementation Failed - 01/12/2024  2:03 PM      Failed - Cr in normal range and within 360 days    Creatinine  Date Value Ref Range Status  02/24/2023 1.05 (H) 0.44 - 1.00 mg/dL Final         Passed - K in normal range and within 360 days    Potassium  Date Value Ref Range Status  02/24/2023 3.6 3.5 - 5.1 mmol/L Final         Passed - Valid encounter within last 12 months    Recent Outpatient Visits           1 month ago Acute left-sided low back pain without sciatica   [place] [facility] Larae Grooms, NP   2 months ago Alzheimer disease [facility])   [place] [facility] [place], Corrie Dandy T, NP   5 months ago Alzheimer disease [facility])   [place] [facility] [place], Corrie Dandy T, NP   5 months ago Alzheimer disease [facility])   [place] [facility] [place], Corrie Dandy T, NP   11 months ago Alzheimer disease [facility])   [place] [facility] [place], Dorie Rank, NP       Future Appointments             In 1 month [name], Dorie Rank, NP [place] [facility], [facility]

## 2024-01-15 DIAGNOSIS — S39012A Strain of muscle, fascia and tendon of lower back, initial encounter: Secondary | ICD-10-CM | POA: Diagnosis not present

## 2024-01-18 DIAGNOSIS — S39012A Strain of muscle, fascia and tendon of lower back, initial encounter: Secondary | ICD-10-CM | POA: Diagnosis not present

## 2024-01-22 DIAGNOSIS — S39012A Strain of muscle, fascia and tendon of lower back, initial encounter: Secondary | ICD-10-CM | POA: Diagnosis not present

## 2024-01-25 DIAGNOSIS — S39012A Strain of muscle, fascia and tendon of lower back, initial encounter: Secondary | ICD-10-CM | POA: Diagnosis not present

## 2024-02-23 ENCOUNTER — Inpatient Hospital Stay: Payer: Medicare Other | Attending: Oncology

## 2024-02-23 ENCOUNTER — Other Ambulatory Visit: Payer: Self-pay

## 2024-02-23 DIAGNOSIS — R768 Other specified abnormal immunological findings in serum: Secondary | ICD-10-CM | POA: Diagnosis not present

## 2024-02-23 DIAGNOSIS — R5382 Chronic fatigue, unspecified: Secondary | ICD-10-CM | POA: Diagnosis not present

## 2024-02-23 DIAGNOSIS — D472 Monoclonal gammopathy: Secondary | ICD-10-CM | POA: Insufficient documentation

## 2024-02-23 DIAGNOSIS — N1831 Chronic kidney disease, stage 3a: Secondary | ICD-10-CM

## 2024-02-23 LAB — CBC WITH DIFFERENTIAL (CANCER CENTER ONLY)
Abs Immature Granulocytes: 0.02 10*3/uL (ref 0.00–0.07)
Basophils Absolute: 0 10*3/uL (ref 0.0–0.1)
Basophils Relative: 0 %
Eosinophils Absolute: 0.1 10*3/uL (ref 0.0–0.5)
Eosinophils Relative: 1 %
HCT: 39.5 % (ref 36.0–46.0)
Hemoglobin: 13.2 g/dL (ref 12.0–15.0)
Immature Granulocytes: 0 %
Lymphocytes Relative: 26 %
Lymphs Abs: 1.3 10*3/uL (ref 0.7–4.0)
MCH: 31.1 pg (ref 26.0–34.0)
MCHC: 33.4 g/dL (ref 30.0–36.0)
MCV: 93.2 fL (ref 80.0–100.0)
Monocytes Absolute: 0.5 10*3/uL (ref 0.1–1.0)
Monocytes Relative: 9 %
Neutro Abs: 3.1 10*3/uL (ref 1.7–7.7)
Neutrophils Relative %: 64 %
Platelet Count: 161 10*3/uL (ref 150–400)
RBC: 4.24 MIL/uL (ref 3.87–5.11)
RDW: 14.2 % (ref 11.5–15.5)
WBC Count: 5 10*3/uL (ref 4.0–10.5)
nRBC: 0 % (ref 0.0–0.2)

## 2024-02-23 LAB — CMP (CANCER CENTER ONLY)
ALT: 11 U/L (ref 0–44)
AST: 24 U/L (ref 15–41)
Albumin: 4 g/dL (ref 3.5–5.0)
Alkaline Phosphatase: 59 U/L (ref 38–126)
Anion gap: 8 (ref 5–15)
BUN: 14 mg/dL (ref 8–23)
CO2: 28 mmol/L (ref 22–32)
Calcium: 10.2 mg/dL (ref 8.9–10.3)
Chloride: 102 mmol/L (ref 98–111)
Creatinine: 1.02 mg/dL — ABNORMAL HIGH (ref 0.44–1.00)
GFR, Estimated: 53 mL/min — ABNORMAL LOW (ref >60)
Glucose, Bld: 109 mg/dL — ABNORMAL HIGH (ref 70–99)
Potassium: 3.7 mmol/L (ref 3.5–5.1)
Sodium: 138 mmol/L (ref 135–145)
Total Bilirubin: 1.5 mg/dL — ABNORMAL HIGH (ref 0.0–1.2)
Total Protein: 7.3 g/dL (ref 6.5–8.1)

## 2024-02-23 MED ORDER — DONEPEZIL HCL 10 MG PO TABS: 10.0000 mg | ORAL_TABLET | Freq: Every day | ORAL | 1 refills | Status: DC

## 2024-02-26 LAB — KAPPA/LAMBDA LIGHT CHAINS
Kappa free light chain: 38.3 mg/L — ABNORMAL HIGH (ref 3.3–19.4)
Kappa, lambda light chain ratio: 1.31 (ref 0.26–1.65)
Lambda free light chains: 29.2 mg/L — ABNORMAL HIGH (ref 5.7–26.3)

## 2024-02-27 LAB — MULTIPLE MYELOMA PANEL, SERUM
Albumin SerPl Elph-Mcnc: 3.9 g/dL (ref 2.9–4.4)
Albumin/Glob SerPl: 1.4 (ref 0.7–1.7)
Alpha 1: 0.3 g/dL (ref 0.0–0.4)
Alpha2 Glob SerPl Elph-Mcnc: 0.6 g/dL (ref 0.4–1.0)
B-Globulin SerPl Elph-Mcnc: 1 g/dL (ref 0.7–1.3)
Gamma Glob SerPl Elph-Mcnc: 1.2 g/dL (ref 0.4–1.8)
Globulin, Total: 3 g/dL (ref 2.2–3.9)
IgA: 202 mg/dL (ref 64–422)
IgG (Immunoglobin G), Serum: 1180 mg/dL (ref 586–1602)
IgM (Immunoglobulin M), Srm: 291 mg/dL — ABNORMAL HIGH (ref 26–217)
Total Protein ELP: 6.9 g/dL (ref 6.0–8.5)

## 2024-02-29 ENCOUNTER — Ambulatory Visit: Payer: Self-pay | Admitting: Nurse Practitioner

## 2024-02-29 DIAGNOSIS — E559 Vitamin D deficiency, unspecified: Secondary | ICD-10-CM

## 2024-02-29 DIAGNOSIS — I700 Atherosclerosis of aorta: Secondary | ICD-10-CM

## 2024-02-29 DIAGNOSIS — K219 Gastro-esophageal reflux disease without esophagitis: Secondary | ICD-10-CM

## 2024-02-29 DIAGNOSIS — M810 Age-related osteoporosis without current pathological fracture: Secondary | ICD-10-CM

## 2024-02-29 DIAGNOSIS — N1831 Chronic kidney disease, stage 3a: Secondary | ICD-10-CM

## 2024-02-29 DIAGNOSIS — I129 Hypertensive chronic kidney disease with stage 1 through stage 4 chronic kidney disease, or unspecified chronic kidney disease: Secondary | ICD-10-CM

## 2024-02-29 DIAGNOSIS — F0280 Dementia in other diseases classified elsewhere without behavioral disturbance: Secondary | ICD-10-CM

## 2024-02-29 DIAGNOSIS — E538 Deficiency of other specified B group vitamins: Secondary | ICD-10-CM

## 2024-02-29 DIAGNOSIS — D472 Monoclonal gammopathy: Secondary | ICD-10-CM

## 2024-03-03 NOTE — Patient Instructions (Signed)

## 2024-03-05 ENCOUNTER — Ambulatory Visit: Payer: Medicare Other | Admitting: Oncology

## 2024-03-06 ENCOUNTER — Encounter: Payer: Self-pay | Admitting: Nurse Practitioner

## 2024-03-06 ENCOUNTER — Ambulatory Visit (INDEPENDENT_AMBULATORY_CARE_PROVIDER_SITE_OTHER): Admitting: Nurse Practitioner

## 2024-03-06 ENCOUNTER — Encounter: Payer: Self-pay | Admitting: Oncology

## 2024-03-06 ENCOUNTER — Inpatient Hospital Stay (HOSPITAL_BASED_OUTPATIENT_CLINIC_OR_DEPARTMENT_OTHER): Payer: Medicare Other | Admitting: Oncology

## 2024-03-06 VITALS — BP 139/73 | HR 75 | Temp 96.0°F | Resp 18 | Wt 149.4 lb

## 2024-03-06 VITALS — BP 120/58 | HR 93 | Temp 98.3°F | Ht 62.0 in | Wt 149.8 lb

## 2024-03-06 DIAGNOSIS — D472 Monoclonal gammopathy: Secondary | ICD-10-CM

## 2024-03-06 DIAGNOSIS — G309 Alzheimer's disease, unspecified: Secondary | ICD-10-CM | POA: Diagnosis not present

## 2024-03-06 DIAGNOSIS — M1A0710 Idiopathic chronic gout, right ankle and foot, without tophus (tophi): Secondary | ICD-10-CM | POA: Diagnosis not present

## 2024-03-06 DIAGNOSIS — M810 Age-related osteoporosis without current pathological fracture: Secondary | ICD-10-CM

## 2024-03-06 DIAGNOSIS — R768 Other specified abnormal immunological findings in serum: Secondary | ICD-10-CM | POA: Diagnosis not present

## 2024-03-06 DIAGNOSIS — I129 Hypertensive chronic kidney disease with stage 1 through stage 4 chronic kidney disease, or unspecified chronic kidney disease: Secondary | ICD-10-CM | POA: Diagnosis not present

## 2024-03-06 DIAGNOSIS — E538 Deficiency of other specified B group vitamins: Secondary | ICD-10-CM | POA: Diagnosis not present

## 2024-03-06 DIAGNOSIS — N1831 Chronic kidney disease, stage 3a: Secondary | ICD-10-CM

## 2024-03-06 DIAGNOSIS — R5382 Chronic fatigue, unspecified: Secondary | ICD-10-CM | POA: Diagnosis not present

## 2024-03-06 DIAGNOSIS — R011 Cardiac murmur, unspecified: Secondary | ICD-10-CM | POA: Insufficient documentation

## 2024-03-06 DIAGNOSIS — Z8679 Personal history of other diseases of the circulatory system: Secondary | ICD-10-CM

## 2024-03-06 DIAGNOSIS — F0280 Dementia in other diseases classified elsewhere without behavioral disturbance: Secondary | ICD-10-CM | POA: Diagnosis not present

## 2024-03-06 DIAGNOSIS — I700 Atherosclerosis of aorta: Secondary | ICD-10-CM

## 2024-03-06 NOTE — Assessment & Plan Note (Signed)
 Ongoing. Continue Fosamax 7 mg every 7 days at this time. No recent falls. Continue supplements. Return in 6 months.

## 2024-03-06 NOTE — Assessment & Plan Note (Signed)
 No recent falls reported. Continue to monitor closely and avoid any ASA at this time. Return in 6 months.

## 2024-03-06 NOTE — Assessment & Plan Note (Signed)
 Chronic, stable. Allopurinol at 100 mg daily and continue current regimen and adjust as eeded.  Uric acid level today. Return in 6 months.

## 2024-03-06 NOTE — Assessment & Plan Note (Signed)
 Chronic. No statin at this time due to advanced age and patient wishes. No ASA at this time due to fall risk. No recent falls reported. Return in 6 months.

## 2024-03-06 NOTE — Assessment & Plan Note (Addendum)
 Chronic with CKD 3a. Labs: CMP, CBC. Refer to nephrology as needed if worsening CKD. Renal dose medications as needed. Return in 6 months.

## 2024-03-06 NOTE — Assessment & Plan Note (Signed)
 Had visit with oncology today, no changes made.

## 2024-03-06 NOTE — Progress Notes (Deleted)
 BP (!) 120/58   Pulse 93   Temp 98.3 F (36.8 C) (Oral)   Ht 5\' 2"  (1.575 m)   Wt 149 lb 12.8 oz (67.9 kg)   LMP  (LMP Unknown)   SpO2 94%   BMI 27.40 kg/m    Subjective:    Patient ID: Cynthia Hurst, female    DOB: [birth year], 89 y.o.   MRN: [id number]  HPI: Cynthia Hurst is a 89 y.o. female  Chief Complaint  Patient presents with   Hypertension   Memory Loss   Osteoporosis   Daughter present at bedside with her.  CHRONIC KIDNEY DISEASE (3a) CKD status: {Blank single:19197::"controlled","uncontrolled","better","worse","exacerbated","stable"} Medications renally dose: {Blank single:19197::"yes","no"} Previous renal evaluation: {Blank single:19197::"yes","no"} Pneumovax:  {Blank single:19197::"Up to Date","Not up to Date","unknown"} Influenza Vaccine:  {Blank single:19197::"Up to Date","Not up to Date","unknown"}   OSTEOPOROSIS DEXA 06/13/22 noted osteoporosis, previous had shown osteopenia.  Continues on Fosamax.  Last fall was Satisfied with current treatment?: {Blank single:19197::"yes","no"} Medication side effects: {Blank single:19197::"yes","no"} Medication compliance: {Blank single:19197::"excellent compliance","good compliance","fair compliance","poor compliance"} Past osteoporosis medications/treatments:  Adequate calcium & vitamin D: {Blank single:19197::"yes","no"} Intolerance to bisphosphonates:{Blank single:19197::"yes","no"} Weight bearing exercises: {Blank single:19197::"yes","no"}   MGUS: Had visit with oncology today, no changes made.  Last renal ultrasound on 02/14/2020 with normal findings, right-sided renal cysts measuring 1.8 cm and questionable mild bladder wall thickening. Denies any B symptoms at this time.    DEMENTIA + HTN Continues on Aricept 10 MG at bedtime.  Saw neurology last on 06/26/23. Continues to live with her husband, but children check on them regularly.  Their driving is limited. Meals on Wheels present, she does not always eat  this though as is picky. Eats breakfast and dinner with snacks in between.  No incontinence issues present. Occasionally missing medication doses per daughter. Family history in parents of dementia.   Satisfied with current treatment? yes Recurrent headaches: {Blank single:19197::"yes","no"} Visual changes: {Blank single:19197::"yes","no"} Palpitations: {Blank single:19197::"yes","no"} Dyspnea: {Blank single:19197::"yes","no"} Chest pain: {Blank single:19197::"yes","no"} Lower extremity edema: {Blank single:19197::"yes","no"} Dizzy/lightheaded: {Blank single:19197::"yes","no"}     09/27/2022   10:01 AM  MMSE - Mini Mental State Exam  Orientation to time 1  Orientation to Place 5  Registration 3  Attention/ Calculation 4  Recall 0  Language- name 2 objects 2  Language- repeat 1  Language- follow 3 step command 3  Language- read & follow direction 1  Write a sentence 1  Copy design 1  Total score 22       08/15/2023   10:57 AM 07/24/2023   11:49 AM 08/04/2022   12:37 PM 08/02/2021   11:22 AM 07/27/2020   11:25 AM  6CIT Screen  What Year? 4 points 4 points 4 points 0 points 0 points  What month? 0 points 0 points 0 points 0 points 0 points  What time? 0 points 0 points 0 points 0 points 0 points  Count back from 20 0 points 0 points 0 points 0 points 0 points  Months in reverse 4 points 4 points 2 points 2 points 4 points  Repeat phrase 10 points 8 points 10 points 2 points 0 points  Total Score 18 points 16 points 16 points 4 points 4 points   Relevant past medical, surgical, family and social history reviewed and updated as indicated. Interim medical history since our last visit reviewed. Allergies and medications reviewed and updated.  Review of Systems Per HPI unless specifically indicated above     Objective:    BP [place] [facility])  120/58   Pulse 93   Temp 98.3 F (36.8 C) (Oral)   Ht 5\' 2"  (1.575 m)   Wt 149 lb 12.8 oz (67.9 kg)   LMP  (LMP Unknown)   SpO2 94%   BMI 27.40  kg/m   Wt Readings from Last 3 Encounters:  03/06/24 149 lb 12.8 oz (67.9 kg)  03/06/24 149 lb 6.4 oz (67.8 kg)  11/22/23 152 lb 12.8 oz (69.3 kg)    Physical Exam  Results for orders placed or performed in visit on 02/23/24  Multiple Myeloma Panel (SPEP&IFE w/QIG)   Collection Time: 02/23/24 10:50 AM  Result Value Ref Range   IgG (Immunoglobin G), Serum 1,180 586 - 1,602 mg/dL   IgA 161 64 - 096 mg/dL   IgM (Immunoglobulin M), Srm 291 (H) 26 - 217 mg/dL   Total Protein ELP 6.9 6.0 - 8.5 g/dL   Albumin SerPl Elph-Mcnc 3.9 2.9 - 4.4 g/dL   Alpha 1 0.3 0.0 - 0.4 g/dL   Alpha2 Glob SerPl Elph-Mcnc 0.6 0.4 - 1.0 g/dL   B-Globulin SerPl Elph-Mcnc 1.0 0.7 - 1.3 g/dL   Gamma Glob SerPl Elph-Mcnc 1.2 0.4 - 1.8 g/dL   M Protein SerPl Elph-Mcnc Comment (A) Not Observed g/dL   Globulin, Total 3.0 2.2 - 3.9 g/dL   Albumin/Glob SerPl 1.4 0.7 - 1.7   IFE 1 Comment (A)    Please Note Comment   CMP (Cancer Center only)   Collection Time: 02/23/24 10:50 AM  Result Value Ref Range   Sodium 138 135 - 145 mmol/L   Potassium 3.7 3.5 - 5.1 mmol/L   Chloride 102 98 - 111 mmol/L   CO2 28 22 - 32 mmol/L   Glucose, Bld 109 (H) 70 - 99 mg/dL   BUN 14 8 - 23 mg/dL   Creatinine 0.45 (H) 4.09 - 1.00 mg/dL   Calcium 81.1 8.9 - 91.4 mg/dL   Total Protein 7.3 6.5 - 8.1 g/dL   Albumin 4.0 3.5 - 5.0 g/dL   AST 24 15 - 41 U/L   ALT 11 0 - 44 U/L   Alkaline Phosphatase 59 38 - 126 U/L   Total Bilirubin 1.5 (H) 0.0 - 1.2 mg/dL   GFR, Estimated 53 (L) >60 mL/min   Anion gap 8 5 - 15  Kappa/lambda light chains   Collection Time: 02/23/24 10:51 AM  Result Value Ref Range   Kappa free light chain 38.3 (H) 3.3 - 19.4 mg/L   Lambda free light chains 29.2 (H) 5.7 - 26.3 mg/L   Kappa, lambda light chain ratio 1.31 0.26 - 1.65  CBC with Differential (Cancer Center Only)   Collection Time: 02/23/24 10:51 AM  Result Value Ref Range   WBC Count 5.0 4.0 - 10.5 K/uL   RBC 4.24 3.87 - 5.11 MIL/uL   Hemoglobin  13.2 12.0 - 15.0 g/dL   HCT 78.2 95.6 - 21.3 %   MCV 93.2 80.0 - 100.0 fL   MCH 31.1 26.0 - 34.0 pg   MCHC 33.4 30.0 - 36.0 g/dL   RDW 08.6 57.8 - 46.9 %   Platelet Count 161 150 - 400 K/uL   nRBC 0.0 0.0 - 0.2 %   Neutrophils Relative % 64 %   Neutro Abs 3.1 1.7 - 7.7 K/uL   Lymphocytes Relative 26 %   Lymphs Abs 1.3 0.7 - 4.0 K/uL   Monocytes Relative 9 %   Monocytes Absolute 0.5 0.1 - 1.0 K/uL   Eosinophils Relative 1 %  Eosinophils Absolute 0.1 0.0 - 0.5 K/uL   Basophils Relative 0 %   Basophils Absolute 0.0 0.0 - 0.1 K/uL   Immature Granulocytes 0 %   Abs Immature Granulocytes 0.02 0.00 - 0.07 K/uL      Assessment & Plan:   Problem List Items Addressed This Visit       Cardiovascular and Mediastinum   Aortic atherosclerosis (HCC) (Chronic)   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)   Relevant Orders   CBC with Differential/Platelet   Comprehensive metabolic panel   TSH     Nervous and Auditory   Alzheimer disease (HCC) - Primary     Musculoskeletal and Integument   Age related osteoporosis (Chronic)   Relevant Orders   VITAMIN D 25 Hydroxy (Vit-D Deficiency, Fractures)     Genitourinary   CKD (chronic kidney disease), stage III (HCC) (Chronic)     Other   Gout (Chronic)   Relevant Orders   Uric acid   MGUS (monoclonal gammopathy of unknown significance) (Chronic)   Vitamin B12 deficiency (Chronic)   Relevant Orders   Vitamin B12   History of subdural hematoma      Follow up plan: Return in about 6 months (around 09/06/2024) for CKD, OSTEOPOROSIS, DEMENTIA, HTN, MGUS.

## 2024-03-06 NOTE — Assessment & Plan Note (Signed)
 Encourage oral hydration and avoid nephrotoxins.

## 2024-03-06 NOTE — Assessment & Plan Note (Signed)
 Chronic, stable. BP below goal in office today. Continue current medication regimen and adjust as needed. LABS: CBC, CMP, Uric acid, Vit. D25, Vit. B12, TSH. Renal dose medications as needed. Recommend she continue to monitor BP at home at least a few days a week and ocument. Focus on DASH diet. Return in 6 months.

## 2024-03-06 NOTE — Progress Notes (Signed)
 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 nondetectable M protein on protein electrophoresis.  Elevated Kappa free light chain level with normal light chain ratio. IFE +IgA monoclonal protein with kappa restriction. Labs reviewed and discussed with patient Lab Results  Component Value Date   MPROTEIN Comment (A) 02/23/2024   KPAFRELGTCHN 38.3 (H) 02/23/2024   LAMBDASER 29.2 (H) 02/23/2024   KAPLAMBRATIO 1.31 02/23/2024     Patient has normal hemoglobin, stable renal function. No intervention needed.   I recommend annual follow-up.  CKD (chronic kidney disease), stage III (HCC) Encourage oral hydration and avoid nephrotoxins.    Heart murmur I noticed heart murmur on today's physical examination.  Recommend patient to discuss with PCP  Orders Placed This Encounter  Procedures   CBC with Differential (Cancer Center Only)    Standing Status:   Future    Expected Date:   09/06/2024    Expiration Date:   03/06/2025   CMP (Cancer Center only)    Standing Status:   Future    Expected Date:   09/06/2024    Expiration Date:   03/06/2025   Multiple Myeloma Panel (SPEP&IFE w/QIG)    Standing Status:   Future    Expected Date:   09/06/2024    Expiration Date:   03/06/2025   Kappa/lambda light chains    Standing Status:   Future    Expected Date:   09/06/2024    Expiration Date:   03/06/2025   Follow-up in 1 year All questions were answered. The patient knows to call the clinic with any problems, questions or concerns.  Cynthia Patience, MD, PhD [facility] Hematology Oncology 03/06/2024    HISTORY OF PRESENTING ILLNESS:  Cynthia Hurst is a  89 y.o.  female presents for follow-up of IgM MGUS Patient was referred to see a see rheumatology for evaluation of positive ANA,  joint pain.  Lab workup reviewed hypercalcemia of 11, normal PTH, normal 25-hydroxy vitamin D level, normal TSH, SPEP showed  monoclonal spike of 0.4.  Immunofixation showed elevated IgA monoclonal protein with kappa light chain specificity. Patient reports feeling well, she denies any fatigue, weight loss, back pain history of fracture.  She has bilateral knee replacement reports having posterior thigh pain bilaterally. Patient denies taking any calcium supplements.  Never smoker  # Bone marrow biopsy results was reviewed with patient and her family members in details.  Showed 2% plasma cells, consistent with MGUS  .INTERVAL HISTORY Cynthia Hurst is a 89 y.o. female who has above history reviewed by me today presents for follow-up visit for management of IgA MGUS. Was accompanied by daughter. + Fatigue, chronic stable.  No new complaints.  Review of Systems  Constitutional:  Negative for chills, fever, malaise/fatigue and weight loss.  HENT:  Negative for sore throat.   Eyes:  Negative for redness.  Respiratory:  Negative for cough, shortness of breath and wheezing.   Cardiovascular:  Negative for chest pain, palpitations and leg swelling.  Gastrointestinal:  Negative for abdominal pain, blood in stool, nausea and vomiting.  Genitourinary:  Negative for dysuria.  Musculoskeletal:  Negative for myalgias.  Skin:  Negative for rash.  Neurological:  Negative for dizziness, tingling and tremors.  Endo/Heme/Allergies:  Does not bruise/bleed easily.  Psychiatric/Behavioral:  Negative for hallucinations.     MEDICAL  HISTORY:  Past Medical History:  Diagnosis Date   Chronic kidney disease    GERD (gastroesophageal reflux disease)    Gout    Hypertension    Hypopotassemia    Osteoarthritis    Osteoporosis    LUMBAR SPINE   Vitamin B 12 deficiency    Vitamin D deficiency     SURGICAL HISTORY: Past Surgical History:  Procedure Laterality Date   ABDOMINAL HYSTERECTOMY     PARTIAL    EYE SURGERY Left 05/2018   cataract surgery    JOINT REPLACEMENT Bilateral 2010,2011   KNEE    SOCIAL HISTORY: Social History   Socioeconomic History   Marital status: Married    Spouse name: [name]   Number of children: 7   Years of education: Not on file   Highest education level: High school graduate  Occupational History   Occupation: retired    Comment: Psychiatric nurse  Tobacco Use   Smoking status: Never   Smokeless tobacco: Never  Vaping Use   Vaping status: Never Used  Substance and Sexual Activity   Alcohol use: No   Drug use: No   Sexual activity: Yes  Other Topics Concern   Not on file  Social History Narrative   Married, 7 children, 6 living 1 deceased   Social Drivers of Corporate investment banker Strain: Low Risk  (08/15/2023)   Overall Financial Resource Strain (CARDIA)    Difficulty of Paying Living Expenses: Not hard at all  Food Insecurity: No Food Insecurity (08/15/2023)   Hunger Vital Sign    Worried About Running Out of Food in the Last Year: Never true    Ran Out of Food in the Last Year: Never true  Transportation Needs: No Transportation Needs (08/15/2023)   PRAPARE - Administrator, Civil Service (Medical): No    Lack of Transportation (Non-Medical): No  Physical Activity: Inactive (08/15/2023)   Exercise Vital Sign    Days of Exercise per Week: 0 days    Minutes of Exercise per Session: 0 min  Stress: No Stress Concern Present (08/15/2023)   [employer] of Occupational Health - Occupational Stress Questionnaire    Feeling of Stress : Not at all  Social Connections: Moderately Integrated (08/15/2023)   Social Connection and Isolation Panel [NHANES]    Frequency of Communication with Friends and Family: More than three times a week    Frequency of Social Gatherings with Friends and Family: More than three times a week    Attends Religious Services: More than 4 times per year    Active Member of [employer] or Organizations: No     Attends Banker Meetings: Never    Marital Status: Married  Catering manager Violence: Not At Risk (08/15/2023)   Humiliation, Afraid, Rape, and Kick questionnaire    Fear of Current or Ex-Partner: No    Emotionally Abused: No    Physically Abused: No    Sexually Abused: No    FAMILY HISTORY: Family History  Problem Relation Age of Onset   Heart disease Mother    Cancer Father    Alzheimer's disease Father    Breast cancer Neg Hx     ALLERGIES:  has no known allergies.  MEDICATIONS:  Current Outpatient Medications  Medication Sig Dispense Refill   alendronate (FOSAMAX) 70 MG tablet Take 1 tablet (70 mg total) by mouth every 7 (seven) days. Take with a full glass of water on an empty stomach. 4 tablet 3  allopurinol (ZYLOPRIM) 100 MG tablet TAKE 1 TABLET DAILY 90 tablet 2   Calcium Carb-Cholecalciferol (CALCIUM/VITAMIN D PO) Take 1 tablet by mouth daily.     cholecalciferol (VITAMIN D) 1000 UNITS tablet Take 1,000 Units by mouth daily.     cyanocobalamin 2000 MCG tablet Take 2,000 mcg by mouth daily.     donepezil (ARICEPT) 10 MG tablet Take 1 tablet (10 mg total) by mouth at bedtime. 90 tablet 1   KLOR-CON M20 20 MEQ tablet TAKE 1 TABLET DAILY 90 tablet 3   Lidocaine 3.5 % PTCH Apply 1 patch topically daily. 30 patch 1   NIFEdipine (ADALAT CC) 60 MG 24 hr tablet TAKE 1 TABLET DAILY 90 tablet 3   No current facility-administered medications for this visit.     PHYSICAL EXAMINATION:  Vitals:   03/06/24 1016 03/06/24 1023  BP: (!) 154/75 139/73  Pulse: 75   Resp: 18   Temp: (!) 96 F (35.6 C)   SpO2: 99%    Filed Weights   03/06/24 1016  Weight: 149 lb 6.4 oz (67.8 kg)    Physical Exam Constitutional:      General: She is not in acute distress.    Comments: Patient ambulates independently  HENT:     Head: Normocephalic and atraumatic.  Eyes:     General: No scleral icterus.    Pupils: Pupils are equal, round, and reactive to light.   Cardiovascular:     Rate and Rhythm: Normal rate.     Heart sounds: Murmur heard.  Pulmonary:     Effort: Pulmonary effort is normal. No respiratory distress.     Breath sounds: Normal breath sounds. No wheezing.  Abdominal:     General: Bowel sounds are normal. There is no distension.     Palpations: Abdomen is soft.  Musculoskeletal:        General: No deformity. Normal range of motion.     Cervical back: Normal range of motion.  Skin:    General: Skin is warm and dry.     Findings: No erythema or rash.  Neurological:     Mental Status: She is alert. Mental status is at baseline.     Cranial Nerves: No cranial nerve deficit.  Psychiatric:        Mood and Affect: Mood normal.      LABORATORY DATA:  I have reviewed the data as listed    Latest Ref Rng & Units 02/23/2024   10:51 AM 02/24/2023   10:31 AM 01/19/2023    9:45 AM  CBC  WBC 4.0 - 10.5 K/uL 5.0  5.3  5.7   Hemoglobin 12.0 - 15.0 g/dL 40.9  81.1  91.4   Hematocrit 36.0 - 46.0 % 39.5  36.2  40.8   Platelets 150 - 400 K/uL 161  148  175       Latest Ref Rng & Units 02/23/2024   10:50 AM 02/24/2023   10:30 AM 01/19/2023    9:45 AM  CMP  Glucose 70 - 99 mg/dL 782  956  88   BUN 8 - 23 mg/dL 14  14  12    Creatinine 0.44 - 1.00 mg/dL 2.13  0.86  5.78   Sodium 135 - 145 mmol/L 138  138  142   Potassium 3.5 - 5.1 mmol/L 3.7  3.6  4.3   Chloride 98 - 111 mmol/L 102  106  103   CO2 22 - 32 mmol/L 28  25  25    Calcium  8.9 - 10.3 mg/dL 16.1  9.1  9.9   Total Protein 6.5 - 8.1 g/dL 7.3  6.9  6.6   Total Bilirubin 0.0 - 1.2 mg/dL 1.5  0.8  0.8   Alkaline Phos 38 - 126 U/L 59  61  79   AST 15 - 41 U/L 24  29  21    ALT 0 - 44 U/L 11  9  8        03/12/2018 multiple myeloma work up  [facility] clinic: SPEP showed  monoclonal spike of 0.4.  Immunofixation showed elevated IgA monoclonal protein with kappa light chain specificity.Urine light chain ratio elevated at 12.54, no monoclonal spike on UPEP.   Lab Results  Component Value  Date   TOTALPROTELP 6.9 02/23/2024   ALBUMINELP 3.6 10/19/2018   A1GS 0.3 10/19/2018   A2GS 0.6 10/19/2018   BETS 0.9 10/19/2018   GAMS 1.2 10/19/2018   MSPIKE Not Observed 10/19/2018   SPEI Comment 10/19/2018   Lab Results  Component Value Date   KPAFRELGTCHN 38.3 (H) 02/23/2024   LAMBDASER 29.2 (H) 02/23/2024   KAPLAMBRATIO 1.31 02/23/2024

## 2024-03-06 NOTE — Progress Notes (Signed)
 BP (!) 120/58   Pulse 93   Temp 98.3 F (36.8 C) (Oral)   Ht 5\' 2"  (1.575 m)   Wt 149 lb 12.8 oz (67.9 kg)   LMP  (LMP Unknown)   SpO2 94%   BMI 27.40 kg/m    Subjective:    Patient ID: Cynthia Hurst, female    DOB: 1935/05/08, 89 y.o.   MRN: [id number]  HPI: Cynthia Hurst is a 89 y.o. female.   NOTE WRITTEN BY DNP STUDENT.  ASSESSMENT AND PLAN OF CARE REVIEWED WITH STUDENT, AGREE WITH ABOVE FINDINGS AND PLAN.   Chief Complaint  Patient presents with   Hypertension   Memory Loss   Osteoporosis   Son present with her.   CHRONIC KIDNEY DISEASE (3a) CKD status: stable Medications renally dose: yes Previous renal evaluation: no Pneumovax:  Up to Date Influenza Vaccine:  Up to Date   OSTEOPOROSIS DEXA 06/13/22 noted osteoporosis, previous had shown osteopenia.  Continues on Fosamax.  Last fall was 07/20/23. Satisfied with current treatment?: yes Medication side effects: no Medication compliance: good compliance Adequate calcium & vitamin D: yes Intolerance to bisphosphonates:no Weight bearing exercises: no   MGUS: Had visit with oncology today, no changes made.  Last renal ultrasound on 02/14/2020 with normal findings, right-sided renal cysts measuring 1.8 cm and questionable mild bladder wall thickening. Denies any B symptoms at this time.    DEMENTIA + HTN Continues on Aricept 10 MG at bedtime.  Saw neurology last on 06/26/23. Continues to live with her husband, but children check on them regularly.  Their driving is limited. Meals on Wheels present, she does not always eat this though as is picky. Eats breakfast and dinner with snacks in between.  No incontinence issues present. Occasionally missing medication doses per daughter. Family history in parents of dementia.    The last couple of weeks complaining of headaches. Taking Tylenol 500 mg a day when headache present which helps. No headaches in the last couple of days.  Satisfied with current treatment?  yes Recurrent headaches:  Last couple of weeks 1-2 per week. Visual changes: no Palpitations: no Dyspnea: no Chest pain: no Lower extremity edema: no Dizzy/lightheaded: no     09/27/2022   10:01 AM  MMSE - Mini Mental State Exam  Orientation to time 1  Orientation to Place 5  Registration 3  Attention/ Calculation 4  Recall 0  Language- name 2 objects 2  Language- repeat 1  Language- follow 3 step command 3  Language- read & follow direction 1  Write a sentence 1  Copy design 1  Total score 22       08/15/2023   10:57 AM 07/24/2023   11:49 AM 08/04/2022   12:37 PM 08/02/2021   11:22 AM 07/27/2020   11:25 AM  6CIT Screen  What Year? 4 points 4 points 4 points 0 points 0 points  What month? 0 points 0 points 0 points 0 points 0 points  What time? 0 points 0 points 0 points 0 points 0 points  Count back from 20 0 points 0 points 0 points 0 points 0 points  Months in reverse 4 points 4 points 2 points 2 points 4 points  Repeat phrase 10 points 8 points 10 points 2 points 0 points  Total Score 18 points 16 points 16 points 4 points 4 points   Relevant past medical, surgical, family and social history reviewed and updated as indicated. Interim medical history since our last  visit reviewed. Allergies and medications reviewed and updated.  Review of Systems  Constitutional:  Negative for activity change, appetite change, diaphoresis, fatigue and fever.  Respiratory:  Negative for cough, chest tightness and shortness of breath.   Cardiovascular:  Negative for chest pain, palpitations and leg swelling.  Gastrointestinal: Negative.   Musculoskeletal:  Negative for back pain.  Neurological:  Positive for headaches. Negative for dizziness, syncope, speech difficulty, weakness and numbness.  Psychiatric/Behavioral: Negative.     Per HPI unless specifically indicated above     Objective:    BP (!) 120/58   Pulse 93   Temp 98.3 F (36.8 C) (Oral)   Ht 5\' 2"  (1.575 m)   Wt 149  lb 12.8 oz (67.9 kg)   LMP  (LMP Unknown)   SpO2 94%   BMI 27.40 kg/m   Wt Readings from Last 3 Encounters:  03/06/24 149 lb 12.8 oz (67.9 kg)  03/06/24 149 lb 6.4 oz (67.8 kg)  11/22/23 152 lb 12.8 oz (69.3 kg)    Physical Exam Vitals and nursing note reviewed.  Constitutional:      General: She is awake. She is not in acute distress.    Appearance: Normal appearance. She is well-developed and well-groomed. She is not ill-appearing or toxic-appearing.  HENT:     Head: Normocephalic.     Right Ear: Hearing and external ear normal.     Left Ear: Hearing and external ear normal.  Eyes:     General: Lids are normal.        Right eye: No discharge.        Left eye: No discharge.     Conjunctiva/sclera: Conjunctivae normal.     Pupils: Pupils are equal, round, and reactive to light.  Neck:     Thyroid: No thyromegaly.     Vascular: No carotid bruit.  Cardiovascular:     Rate and Rhythm: Normal rate and regular rhythm.     Heart sounds: Murmur heard.     Systolic murmur is present with a grade of 2/6.     No gallop.  Pulmonary:     Effort: Pulmonary effort is normal. No accessory muscle usage or respiratory distress.     Breath sounds: Normal breath sounds.  Abdominal:     General: Bowel sounds are normal. There is no distension.     Palpations: Abdomen is soft.     Tenderness: There is no abdominal tenderness.  Musculoskeletal:     Cervical back: Normal range of motion and neck supple.     Right lower leg: No edema.     Left lower leg: No edema.     Comments: Kyphosis noted.  Lymphadenopathy:     Cervical: No cervical adenopathy.  Skin:    General: Skin is warm and dry.  Neurological:     Mental Status: She is alert.     Deep Tendon Reflexes: Reflexes are normal and symmetric.     Reflex Scores:      Brachioradialis reflexes are 2+ on the right side and 2+ on the left side.      Patellar reflexes are 2+ on the right side and 2+ on the left side.    Comments:  Pleasantly confused.  Able to report month, but not day and year.  Unable to state who President is.  Psychiatric:        Attention and Perception: Attention normal.        Mood and Affect: Mood normal.  Speech: Speech normal.        Behavior: Behavior normal. Behavior is cooperative.        Thought Content: Thought content normal.        Cognition and Memory: Memory is impaired.     Results for orders placed or performed in visit on 02/23/24  Multiple Myeloma Panel (SPEP&IFE w/QIG)   Collection Time: 02/23/24 10:50 AM  Result Value Ref Range   IgG (Immunoglobin G), Serum 1,180 586 - 1,602 mg/dL   IgA 161 64 - 096 mg/dL   IgM (Immunoglobulin M), Srm 291 (H) 26 - 217 mg/dL   Total Protein ELP 6.9 6.0 - 8.5 g/dL   Albumin SerPl Elph-Mcnc 3.9 2.9 - 4.4 g/dL   Alpha 1 0.3 0.0 - 0.4 g/dL   Alpha2 Glob SerPl Elph-Mcnc 0.6 0.4 - 1.0 g/dL   B-Globulin SerPl Elph-Mcnc 1.0 0.7 - 1.3 g/dL   Gamma Glob SerPl Elph-Mcnc 1.2 0.4 - 1.8 g/dL   M Protein SerPl Elph-Mcnc Comment (A) Not Observed g/dL   Globulin, Total 3.0 2.2 - 3.9 g/dL   Albumin/Glob SerPl 1.4 0.7 - 1.7   IFE 1 Comment (A)    Please Note Comment   CMP (Cancer Center only)   Collection Time: 02/23/24 10:50 AM  Result Value Ref Range   Sodium 138 135 - 145 mmol/L   Potassium 3.7 3.5 - 5.1 mmol/L   Chloride 102 98 - 111 mmol/L   CO2 28 22 - 32 mmol/L   Glucose, Bld 109 (H) 70 - 99 mg/dL   BUN 14 8 - 23 mg/dL   Creatinine 0.45 (H) 4.09 - 1.00 mg/dL   Calcium 81.1 8.9 - 91.4 mg/dL   Total Protein 7.3 6.5 - 8.1 g/dL   Albumin 4.0 3.5 - 5.0 g/dL   AST 24 15 - 41 U/L   ALT 11 0 - 44 U/L   Alkaline Phosphatase 59 38 - 126 U/L   Total Bilirubin 1.5 (H) 0.0 - 1.2 mg/dL   GFR, Estimated 53 (L) >60 mL/min   Anion gap 8 5 - 15  Kappa/lambda light chains   Collection Time: 02/23/24 10:51 AM  Result Value Ref Range   Kappa free light chain 38.3 (H) 3.3 - 19.4 mg/L   Lambda free light chains 29.2 (H) 5.7 - 26.3 mg/L   Kappa,  lambda light chain ratio 1.31 0.26 - 1.65  CBC with Differential (Cancer Center Only)   Collection Time: 02/23/24 10:51 AM  Result Value Ref Range   WBC Count 5.0 4.0 - 10.5 K/uL   RBC 4.24 3.87 - 5.11 MIL/uL   Hemoglobin 13.2 12.0 - 15.0 g/dL   HCT 78.2 95.6 - 21.3 %   MCV 93.2 80.0 - 100.0 fL   MCH 31.1 26.0 - 34.0 pg   MCHC 33.4 30.0 - 36.0 g/dL   RDW 08.6 57.8 - 46.9 %   Platelet Count 161 150 - 400 K/uL   nRBC 0.0 0.0 - 0.2 %   Neutrophils Relative % 64 %   Neutro Abs 3.1 1.7 - 7.7 K/uL   Lymphocytes Relative 26 %   Lymphs Abs 1.3 0.7 - 4.0 K/uL   Monocytes Relative 9 %   Monocytes Absolute 0.5 0.1 - 1.0 K/uL   Eosinophils Relative 1 %   Eosinophils Absolute 0.1 0.0 - 0.5 K/uL   Basophils Relative 0 %   Basophils Absolute 0.0 0.0 - 0.1 K/uL   Immature Granulocytes 0 %   Abs Immature Granulocytes 0.02 0.00 -  0.07 K/uL      Assessment & Plan:   Problem List Items Addressed This Visit       Cardiovascular and Mediastinum   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 BP below goal in office today. Continue current medication regimen and adjust as needed. LABS: CBC, CMP, Uric acid, Vit. D25, Vit. B12, TSH. Renal dose medications as needed. Recommend she continue to monitor BP at home at least a few days a week and ocument. Focus on DASH diet. Return in 6 months.      Relevant Orders   CBC with Differential/Platelet   Comprehensive metabolic panel   TSH   Aortic atherosclerosis (HCC) (Chronic)   Chronic. No statin at this time due to advanced age and patient wishes. No ASA at this time due to fall risk. No recent falls reported. Return in 6 months.        Nervous and Auditory   Alzheimer disease (HCC) - Primary   Chronic, stable. Continue Donepezil 10 mg at bedtime. Continue collaboration with neurology. Return in 6 months.        Musculoskeletal and Integument   Age related osteoporosis (Chronic)   Ongoing. Continue Fosamax 7 mg  every 7 days at this time. No recent falls. Continue supplements. Return in 6 months.      Relevant Orders   VITAMIN D 25 Hydroxy (Vit-D Deficiency, Fractures)     Genitourinary   CKD (chronic kidney disease), stage III (HCC) (Chronic)   Chronic with CKD 3a. Labs: CMP, CBC. Refer to nephrology as needed if worsening CKD. Renal dose medications as needed. Return in 6 months.        Other   Gout (Chronic)   Chronic, stable. Allopurinol at 100 mg daily and continue current regimen and adjust as eeded.  Uric acid level today. Return in 6 months.      Relevant Orders   Uric acid   Vitamin B12 deficiency (Chronic)   Chronic, stable. Continue daily supplement and adjust as needed. B12 level checked today. Return in 6 months.      Relevant Orders   Vitamin B12   MGUS (monoclonal gammopathy of unknown significance) (Chronic)   Had visit with oncology today, no changes made.      History of subdural hematoma   No recent falls reported. Continue to monitor closely and avoid any ASA at this time. Return in 6 months.         Follow up plan: Return in about 6 months (around 09/06/2024) for CKD, OSTEOPOROSIS, DEMENTIA, HTN, MGUS.

## 2024-03-06 NOTE — Assessment & Plan Note (Signed)
 Chronic, stable. Continue daily supplement and adjust as needed. B12 level checked today. Return in 6 months.

## 2024-03-06 NOTE — Assessment & Plan Note (Addendum)
 Chronic, stable. Continue Donepezil 10 mg at bedtime. Continue collaboration with neurology. Return in 6 months.

## 2024-03-06 NOTE — Assessment & Plan Note (Signed)
 I noticed heart murmur on today's physical examination.  Recommend patient to discuss with PCP

## 2024-03-06 NOTE — Assessment & Plan Note (Addendum)
 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 nondetectable M protein on protein electrophoresis.  Elevated Kappa free light chain level with normal light chain ratio. IFE +IgA monoclonal protein with kappa restriction. Labs reviewed and discussed with patient Lab Results  Component Value Date   MPROTEIN Comment (A) 02/23/2024   KPAFRELGTCHN 38.3 (H) 02/23/2024   LAMBDASER 29.2 (H) 02/23/2024   KAPLAMBRATIO 1.31 02/23/2024     Patient has normal hemoglobin, stable renal function. No intervention needed.   I recommend annual follow-up.

## 2024-03-07 ENCOUNTER — Encounter: Payer: Self-pay | Admitting: Nurse Practitioner

## 2024-03-07 LAB — CBC WITH DIFFERENTIAL/PLATELET
Basophils Absolute: 0 10*3/uL (ref 0.0–0.2)
Basos: 0 %
EOS (ABSOLUTE): 0 10*3/uL (ref 0.0–0.4)
Eos: 1 %
Hematocrit: 40.1 % (ref 34.0–46.6)
Hemoglobin: 13.2 g/dL (ref 11.1–15.9)
Immature Grans (Abs): 0 10*3/uL (ref 0.0–0.1)
Immature Granulocytes: 1 %
Lymphocytes Absolute: 1.3 10*3/uL (ref 0.7–3.1)
Lymphs: 22 %
MCH: 30.4 pg (ref 26.6–33.0)
MCHC: 32.9 g/dL (ref 31.5–35.7)
MCV: 92 fL (ref 79–97)
Monocytes Absolute: 0.5 10*3/uL (ref 0.1–0.9)
Monocytes: 8 %
Neutrophils Absolute: 4.1 10*3/uL (ref 1.4–7.0)
Neutrophils: 68 %
Platelets: 191 10*3/uL (ref 150–450)
RBC: 4.34 x10E6/uL (ref 3.77–5.28)
RDW: 13.8 % (ref 11.7–15.4)
WBC: 5.9 10*3/uL (ref 3.4–10.8)

## 2024-03-07 LAB — COMPREHENSIVE METABOLIC PANEL WITH GFR
ALT: 11 IU/L (ref 0–32)
AST: 23 IU/L (ref 0–40)
Albumin: 4.2 g/dL (ref 3.7–4.7)
Alkaline Phosphatase: 85 IU/L (ref 44–121)
BUN/Creatinine Ratio: 13 (ref 12–28)
BUN: 15 mg/dL (ref 8–27)
Bilirubin Total: 0.8 mg/dL (ref 0.0–1.2)
CO2: 27 mmol/L (ref 20–29)
Calcium: 10.3 mg/dL (ref 8.7–10.3)
Chloride: 102 mmol/L (ref 96–106)
Creatinine, Ser: 1.2 mg/dL — ABNORMAL HIGH (ref 0.57–1.00)
Globulin, Total: 2.5 g/dL (ref 1.5–4.5)
Glucose: 148 mg/dL — ABNORMAL HIGH (ref 70–99)
Potassium: 3.9 mmol/L (ref 3.5–5.2)
Sodium: 143 mmol/L (ref 134–144)
Total Protein: 6.7 g/dL (ref 6.0–8.5)
eGFR: 44 mL/min/{1.73_m2} — ABNORMAL LOW (ref >59)

## 2024-03-07 LAB — URIC ACID: Uric Acid: 4.1 mg/dL (ref 3.1–7.9)

## 2024-03-07 LAB — VITAMIN D 25 HYDROXY (VIT D DEFICIENCY, FRACTURES): Vit D, 25-Hydroxy: 57.6 ng/mL (ref 30.0–100.0)

## 2024-03-07 LAB — VITAMIN B12: Vitamin B-12: >2000 pg/mL — ABNORMAL HIGH (ref 232–1245)

## 2024-03-07 LAB — TSH: TSH: 2 u[IU]/mL (ref 0.450–4.500)

## 2024-03-07 NOTE — Progress Notes (Signed)
 Contacted via MyChart   Good evening Cynthia Hurst, your labs have returned: - Your continue to show some kidney disease, creatinine and eGFR, continue to ensure good water intake daily and try not to take any Ibuprofen. - Remainder of labs are stable.  Any questions? Keep being amazing!!  Thank you for allowing me to participate in your care.  I appreciate you. Kindest regards, Maui Ahart

## 2024-03-25 ENCOUNTER — Other Ambulatory Visit: Payer: Self-pay | Admitting: Nurse Practitioner

## 2024-03-26 NOTE — Telephone Encounter (Signed)
 Requested medication (s) are due for refill today: na   Requested medication (s) are on the active medication list: yes   Last refill:  10/06/23 #4 tablets 3 refills  Future visit scheduled: no   Notes to clinic:  protocol failed. Last labs 03/06/24. Last OV 03/06/24. Do you want to refill for 12 refills?     Requested Prescriptions  Pending Prescriptions Disp Refills   alendronate (FOSAMAX) 70 MG tablet [Pharmacy Med Name: ALENDRONATE SODIUM TABS 4'S 70MG ] 4 tablet 12    Sig: TAKE 1 TABLET EVERY 7 DAYS WITH A FULL GLASS OF WATER ON AN EMPTY STOMACH     Endocrinology:  Bisphosphonates Failed - 03/26/2024  3:30 PM      Failed - Cr in normal range and within 360 days    Creatinine  Date Value Ref Range Status  02/23/2024 1.02 (H) 0.44 - 1.00 mg/dL Final   Creatinine, Ser  Date Value Ref Range Status  03/06/2024 1.20 (H) 0.57 - 1.00 mg/dL Final         Failed - Mg Level in normal range and within 360 days    Magnesium  Date Value Ref Range Status  09/27/2022 1.9 1.6 - 2.3 mg/dL Final         Failed - Phosphate in normal range and within 360 days    No results found for: "PHOS"       Passed - Ca in normal range and within 360 days    Calcium  Date Value Ref Range Status  03/06/2024 10.3 8.7 - 10.3 mg/dL Final         Passed - Vitamin D in normal range and within 360 days    Vit D, 25-Hydroxy  Date Value Ref Range Status  03/06/2024 57.6 30.0 - 100.0 ng/mL Final    Comment:    Vitamin D deficiency has been defined by the Institute of Medicine and an Endocrine Society practice guideline as a level of serum 25-OH vitamin D less than 20 ng/mL (1,2). The Endocrine Society went on to further define vitamin D insufficiency as a level between 21 and 29 ng/mL (2). 1. IOM (Institute of Medicine). 2010. Dietary reference    intakes for calcium and D. [place]: The    [employer]. 2. Holick MF, Binkley [place], Bischoff-Ferrari HA, et al.    Evaluation, treatment,  and prevention of vitamin D    deficiency: an Endocrine Society clinical practice    guideline. JCEM. 2011 Jul; 96(7):1911-30.          Passed - eGFR is 30 or above and within 360 days    GFR calc Af Amer  Date Value Ref Range Status  08/12/2020 50 (L) >60 mL/min Final   GFR, Estimated  Date Value Ref Range Status  02/23/2024 53 (L) >60 mL/min Final    Comment:    (NOTE) Calculated using the CKD-EPI Creatinine Equation (2021)    eGFR  Date Value Ref Range Status  03/06/2024 44 (L) >59 mL/min/1.73 Final         Passed - Valid encounter within last 12 months    Recent Outpatient Visits           2 weeks ago Alzheimer disease (HCC)   [place] [facility] Don[place], [place] T, NP              Passed - Bone Mineral Density or Dexa Scan completed in the last 2 years

## 2024-04-19 ENCOUNTER — Other Ambulatory Visit: Payer: Self-pay | Admitting: Nurse Practitioner

## 2024-04-22 NOTE — Telephone Encounter (Signed)
 Requested Prescriptions  Pending Prescriptions Disp Refills   allopurinol  (ZYLOPRIM ) 100 MG tablet [Pharmacy Med Name: ALLOPURINOL  TABS 100MG ] 90 tablet 3    Sig: TAKE 1 TABLET DAILY     Endocrinology:  Gout Agents - allopurinol  Failed - 04/22/2024 11:29 AM      Failed - Cr in normal range and within 360 days    Creatinine  Date Value Ref Range Status  02/23/2024 1.02 (H) 0.44 - 1.00 mg/dL Final   Creatinine, Ser  Date Value Ref Range Status  03/06/2024 1.20 (H) 0.57 - 1.00 mg/dL Final         Passed - Uric Acid in normal range and within 360 days    Uric Acid  Date Value Ref Range Status  03/06/2024 4.1 3.1 - 7.9 mg/dL Final    Comment:               Therapeutic target for gout patients: <6.0         Passed - Valid encounter within last 12 months    Recent Outpatient Visits           1 month ago Alzheimer disease (HCC)   [place] [facility] [place], [place] T, NP              Passed - CBC within normal limits and completed in the last 12 months    WBC  Date Value Ref Range Status  03/06/2024 5.9 3.4 - 10.8 x10E3/uL Final  02/25/2022 6.2 4.0 - 10.5 K/uL Final   WBC Count  Date Value Ref Range Status  02/23/2024 5.0 4.0 - 10.5 K/uL Final   RBC  Date Value Ref Range Status  03/06/2024 4.34 3.77 - 5.28 x10E6/uL Final  02/23/2024 4.24 3.87 - 5.11 MIL/uL Final   Hemoglobin  Date Value Ref Range Status  03/06/2024 13.2 11.1 - 15.9 g/dL Final   Hematocrit  Date Value Ref Range Status  03/06/2024 40.1 34.0 - 46.6 % Final   MCHC  Date Value Ref Range Status  03/06/2024 32.9 31.5 - 35.7 g/dL Final  40/98/1191 47.8 30.0 - 36.0 g/dL Final   [facility]  Date Value Ref Range Status  03/06/2024 30.4 26.6 - 33.0 pg Final  02/23/2024 31.1 26.0 - 34.0 pg Final   MCV  Date Value Ref Range Status  03/06/2024 92 79 - 97 fL Final   No results found for: "PLTCOUNTKUC", "LABPLAT", "POCPLA" RDW  Date Value Ref Range Status  03/06/2024 13.8 11.7 - 15.4 %  Final

## 2024-06-24 ENCOUNTER — Encounter: Payer: Self-pay | Admitting: Nurse Practitioner

## 2024-06-24 ENCOUNTER — Ambulatory Visit: Payer: Self-pay

## 2024-06-24 ENCOUNTER — Ambulatory Visit (INDEPENDENT_AMBULATORY_CARE_PROVIDER_SITE_OTHER): Admitting: Nurse Practitioner

## 2024-06-24 VITALS — BP 118/65 | HR 49 | Temp 97.9°F | Wt 150.0 lb

## 2024-06-24 DIAGNOSIS — L237 Allergic contact dermatitis due to plants, except food: Secondary | ICD-10-CM

## 2024-06-24 MED ORDER — TRIAMCINOLONE ACETONIDE 0.1 % EX CREA
1.0000 | TOPICAL_CREAM | Freq: Two times a day (BID) | CUTANEOUS | 1 refills | Status: AC
Start: 2024-06-24 — End: ?

## 2024-06-24 NOTE — Progress Notes (Signed)
 BP 118/65   Pulse (!) 49   Temp 97.9 F (36.6 C) (Oral)   Wt 150 lb (68 kg)   LMP  (LMP Unknown)   SpO2 97%   BMI 27.44 kg/m    Subjective:    Patient ID: Cynthia Hurst, female    DOB: [birth year], 89 y.o.   MRN: [id number]  HPI: Cynthia Hurst is a 89 y.o. female  Chief Complaint  Patient presents with   Rash    Believes it appeared Friday    RASH Duration:  days- Friday   Location: left arms  Itching: yes Burning: yes Redness: yes Oozing: yes Scaling: no Blisters: yes Painful: no Fevers: no Change in detergents/soaps/personal care products: no Recent illness: no Recent travel:no History of same: no Context: stable Alleviating factors: nothing Treatments attempted:nothing Shortness of breath: no  Throat/tongue swelling: no Myalgias/arthralgias: no  Relevant past medical, surgical, family and social history reviewed and updated as indicated. Interim medical history since our last visit reviewed. Allergies and medications reviewed and updated.  Review of Systems  Skin:  Positive for rash.    Per HPI unless specifically indicated above     Objective:    BP 118/65   Pulse (!) 49   Temp 97.9 F (36.6 C) (Oral)   Wt 150 lb (68 kg)   LMP  (LMP Unknown)   SpO2 97%   BMI 27.44 kg/m   Wt Readings from Last 3 Encounters:  06/24/24 150 lb (68 kg)  03/06/24 149 lb 12.8 oz (67.9 kg)  03/06/24 149 lb 6.4 oz (67.8 kg)    Physical Exam Vitals and nursing note reviewed.  Constitutional:      General: She is not in acute distress.    Appearance: Normal appearance. She is normal weight. She is not ill-appearing, toxic-appearing or diaphoretic.  HENT:     Head: Normocephalic.     Right Ear: External ear normal.     Left Ear: External ear normal.     Nose: Nose normal.     Mouth/Throat:     Mouth: Mucous membranes are moist.     Pharynx: Oropharynx is clear.  Eyes:     General:        Right eye: No discharge.        Left eye: No discharge.      Extraocular Movements: Extraocular movements intact.     Conjunctiva/sclera: Conjunctivae normal.     Pupils: Pupils are equal, round, and reactive to light.  Cardiovascular:     Rate and Rhythm: Normal rate and regular rhythm.     Heart sounds: No murmur heard. Pulmonary:     Effort: Pulmonary effort is normal. No respiratory distress.     Breath sounds: Normal breath sounds. No wheezing or rales.  Musculoskeletal:     Cervical back: Normal range of motion and neck supple.  Skin:    General: Skin is warm and dry.     Capillary Refill: Capillary refill takes less than 2 seconds.      Neurological:     General: No focal deficit present.     Mental Status: She is alert and oriented to person, place, and time. Mental status is at baseline.  Psychiatric:        Mood and Affect: Mood normal.        Behavior: Behavior normal.        Thought Content: Thought content normal.        Judgment: Judgment normal.  Results for orders placed or performed in visit on 03/06/24  CBC with Differential/Platelet   Collection Time: 03/06/24  4:16 PM  Result Value Ref Range   WBC 5.9 3.4 - 10.8 x10E3/uL   RBC 4.34 3.77 - 5.28 x10E6/uL   Hemoglobin 13.2 11.1 - 15.9 g/dL   Hematocrit 59.8 65.9 - 46.6 %   MCV 92 79 - 97 fL   MCH 30.4 26.6 - 33.0 pg   MCHC 32.9 31.5 - 35.7 g/dL   RDW 86.1 88.2 - 84.5 %   Platelets 191 150 - 450 x10E3/uL   Neutrophils 68 Not Estab. %   Lymphs 22 Not Estab. %   Monocytes 8 Not Estab. %   Eos 1 Not Estab. %   Basos 0 Not Estab. %   Neutrophils Absolute 4.1 1.4 - 7.0 x10E3/uL   Lymphocytes Absolute 1.3 0.7 - 3.1 x10E3/uL   Monocytes Absolute 0.5 0.1 - 0.9 x10E3/uL   EOS (ABSOLUTE) 0.0 0.0 - 0.4 x10E3/uL   Basophils Absolute 0.0 0.0 - 0.2 x10E3/uL   Immature Granulocytes 1 Not Estab. %   Immature Grans (Abs) 0.0 0.0 - 0.1 x10E3/uL  Comprehensive metabolic panel   Collection Time: 03/06/24  4:16 PM  Result Value Ref Range   Glucose 148 (H) 70 - 99 mg/dL    BUN 15 8 - 27 mg/dL   Creatinine, Ser 8.79 (H) 0.57 - 1.00 mg/dL   eGFR 44 (L) >40 fO/fpw/8.26   BUN/Creatinine Ratio 13 12 - 28   Sodium 143 134 - 144 mmol/L   Potassium 3.9 3.5 - 5.2 mmol/L   Chloride 102 96 - 106 mmol/L   CO2 27 20 - 29 mmol/L   Calcium 10.3 8.7 - 10.3 mg/dL   Total Protein 6.7 6.0 - 8.5 g/dL   Albumin 4.2 3.7 - 4.7 g/dL   Globulin, Total 2.5 1.5 - 4.5 g/dL   Bilirubin Total 0.8 0.0 - 1.2 mg/dL   Alkaline Phosphatase 85 44 - 121 IU/L   AST 23 0 - 40 IU/L   ALT 11 0 - 32 IU/L  Uric acid   Collection Time: 03/06/24  4:16 PM  Result Value Ref Range   Uric Acid 4.1 3.1 - 7.9 mg/dL  VITAMIN D  25 Hydroxy (Vit-D Deficiency, Fractures)   Collection Time: 03/06/24  4:16 PM  Result Value Ref Range   Vit D, 25-Hydroxy 57.6 30.0 - 100.0 ng/mL  Vitamin B12   Collection Time: 03/06/24  4:16 PM  Result Value Ref Range   Vitamin B-12 >2000 (H) 232 - 1245 pg/mL  TSH   Collection Time: 03/06/24  4:16 PM  Result Value Ref Range   TSH 2.000 0.450 - 4.500 uIU/mL      Assessment & Plan:   Problem List Items Addressed This Visit   None Visit Diagnoses       Poison ivy    -  Primary   will treat with triamcinalone cream.  Use twice daily x 14 days or until rash has resolved.        Follow up plan: No follow-ups on file.

## 2024-06-24 NOTE — Telephone Encounter (Signed)
 FYI Only or Action Required?: FYI only for provider.  Patient was last seen in primary care on 03/06/2024 by [name], [name] T, NP.  Called Nurse Triage reporting Rash.  Symptoms began over this past weekend-daughter wasn't completely sure when symptoms started.  Interventions attempted: Rest, hydration, or home remedies.  Symptoms are: unchanged.  Triage Disposition: See HCP Within 4 Hours (Or PCP Triage)  Patient/caregiver understands and will follow disposition?: Yes  Copied from CRM [id number]. Topic: Clinical - Red Word Triage >> Jun 24, 2024 10:31 AM Avram MATSU wrote: Red Word that prompted transfer to Nurse Triage: rash on arm,puss,itching,painful. Reason for Disposition  [1] Looks infected (e.g., spreading redness, pus) AND [2] large red area (> 2 inches or 5 cm)  Answer Assessment - Initial Assessment Questions 1. APPEARANCE of RASH: What does the rash look like? (e.g., blisters, dry flaky skin, red spots, redness, sores)     Blisters in clumps, redness 2. LOCATION: Where is the rash located?      Left arm 3. NUMBER: How many spots are there?      numerous 4. SIZE: How big are the spots? (e.g., inches, cm; or compare to size of pinhead, tip of pen, eraser, pea)      The size of a pen head 5. ONSET: When did the rash start?      Started over the weekend 6. ITCHING: Does the rash itch? If Yes, ask: How bad is the itch?  (Scale 0-10; or none, mild, moderate, severe)     moderate 7. PAIN: Does the rash hurt? If Yes, ask: How bad is the pain?  (Scale 0-10; or none, mild, moderate, severe)     Mild-moderate 8. OTHER SYMPTOMS: Do you have any other symptoms? (e.g., fever)     Drainage from the arm with some pus.  Protocols used: Rash or Redness - Localized-A-AH

## 2024-07-15 ENCOUNTER — Other Ambulatory Visit: Payer: Self-pay | Admitting: Nurse Practitioner

## 2024-07-16 NOTE — Telephone Encounter (Signed)
 Too soon for refill, LRF 02/23/24 FOR 90 and 1 RF.  Requested Prescriptions  Pending Prescriptions Disp Refills   donepezil  (ARICEPT ) 10 MG tablet [Pharmacy Med Name: DONEPEZIL  HCL TABS 10MG ] 90 tablet 3    Sig: TAKE 1 TABLET AT BEDTIME     Neurology:  Alzheimer's Agents Passed - 07/16/2024 10:54 AM      Passed - Valid encounter within last 6 months    Recent Outpatient Visits           3 weeks ago Poison ivy   [place] [facility] Melvin Pao, NP   4 months ago Alzheimer disease [facility])    [facility] Valerio Melanie DASEN, NP

## 2024-07-18 ENCOUNTER — Other Ambulatory Visit: Payer: Self-pay | Admitting: Nurse Practitioner

## 2024-07-19 NOTE — Telephone Encounter (Signed)
 Requested Prescriptions  Pending Prescriptions Disp Refills   allopurinol  (ZYLOPRIM ) 100 MG tablet [Pharmacy Med Name: ALLOPURINOL  TABS 100MG ] 90 tablet 1    Sig: TAKE 1 TABLET DAILY     Endocrinology:  Gout Agents - allopurinol  Failed - 07/19/2024 11:29 PM      Failed - Cr in normal range and within 360 days    Creatinine  Date Value Ref Range Status  02/23/2024 1.02 (H) 0.44 - 1.00 mg/dL Final   Creatinine, Ser  Date Value Ref Range Status  03/06/2024 1.20 (H) 0.57 - 1.00 mg/dL Final         Passed - Uric Acid in normal range and within 360 days    Uric Acid  Date Value Ref Range Status  03/06/2024 4.1 3.1 - 7.9 mg/dL Final    Comment:               Therapeutic target for gout patients: <6.0         Passed - Valid encounter within last 12 months    Recent Outpatient Visits           3 weeks ago Poison ivy   [facility] Madelia, Darice, NP   4 months ago Alzheimer disease [facility])   [place] [facility] Loda, [place] T, NP              Passed - CBC within normal limits and completed in the last 12 months    WBC  Date Value Ref Range Status  03/06/2024 5.9 3.4 - 10.8 x10E3/uL Final  02/25/2022 6.2 4.0 - 10.5 K/uL Final   WBC Count  Date Value Ref Range Status  02/23/2024 5.0 4.0 - 10.5 K/uL Final   RBC  Date Value Ref Range Status  03/06/2024 4.34 3.77 - 5.28 x10E6/uL Final  02/23/2024 4.24 3.87 - 5.11 MIL/uL Final   Hemoglobin  Date Value Ref Range Status  03/06/2024 13.2 11.1 - 15.9 g/dL Final   Hematocrit  Date Value Ref Range Status  03/06/2024 40.1 34.0 - 46.6 % Final   MCHC  Date Value Ref Range Status  03/06/2024 32.9 31.5 - 35.7 g/dL Final  96/85/7974 66.5 30.0 - 36.0 g/dL Final   [facility]  Date Value Ref Range Status  03/06/2024 30.4 26.6 - 33.0 pg Final  02/23/2024 31.1 26.0 - 34.0 pg Final   MCV  Date Value Ref Range Status  03/06/2024 92 79 - 97 fL Final   No results found for: PLTCOUNTKUC,  LABPLAT, POCPLA RDW  Date Value Ref Range Status  03/06/2024 13.8 11.7 - 15.4 % Final

## 2024-07-22 ENCOUNTER — Telehealth: Payer: Self-pay | Admitting: Nurse Practitioner

## 2024-07-22 NOTE — Telephone Encounter (Signed)
 Patient Daughter dropped off Request for Personal Care Services Form to be filled out by provider. Patient is requesting a call back at [phone] within 2-5 days when completed. Document is located in providers folder.

## 2024-07-23 NOTE — Telephone Encounter (Signed)
 [place] called back again inquiring about paperwork.  I read the message to her and she will get one of the other siblings to call back.

## 2024-07-23 NOTE — Telephone Encounter (Signed)
 Paperwork received. Will start and then give them to the provider to complete and sign.

## 2024-07-23 NOTE — Telephone Encounter (Signed)
 Ok for E2C2 to review.   Please advise that there is no DPR indicating we can speak with Vickie whom dropped off the [facility] request forms. Due to no DPR we can not share any information at this time.   As for the forms since patient nor husband appear to have [place] Medicaid insurance the forms will not be useful for them.

## 2024-07-25 NOTE — Telephone Encounter (Signed)
 Did you talk to this family?

## 2024-07-25 NOTE — Telephone Encounter (Signed)
 No mam, Was not able to reach when I tried calling to update on the DPR situation.

## 2024-07-29 ENCOUNTER — Telehealth: Payer: Self-pay

## 2024-07-29 NOTE — Telephone Encounter (Signed)
 Copied from CRM #[id number]. Topic: Clinical - Medication Question >> Jul 29, 2024  3:02 PM Emylou G wrote: Reason for CRM: Possible bite on her right thumb?  slight bruise?  Not painful.. Just wanted make sure all is okay.SABRA Pls call daughter

## 2024-07-29 NOTE — Telephone Encounter (Signed)
 Any advice to give the patient's daughter before I call her back?

## 2024-07-30 NOTE — Telephone Encounter (Signed)
 Called and notified patient's daughter of [name]'s message. Daughter verbalized understanding.

## 2024-08-20 ENCOUNTER — Ambulatory Visit: Payer: Self-pay | Admitting: Emergency Medicine

## 2024-08-20 VITALS — Ht 63.0 in | Wt 155.0 lb

## 2024-08-20 DIAGNOSIS — Z780 Asymptomatic menopausal state: Secondary | ICD-10-CM

## 2024-08-20 DIAGNOSIS — Z0000 Encounter for general adult medical examination without abnormal findings: Secondary | ICD-10-CM

## 2024-08-20 NOTE — Patient Instructions (Signed)
 Cynthia Hurst,  Thank you for taking the time for your Medicare Wellness Visit. I appreciate your continued commitment to your health goals. Please review the care plan we discussed, and feel free to reach out if I can assist you further.  Medicare recommends these wellness visits once per year to help you and your care team stay ahead of potential health issues. These visits are designed to focus on prevention, allowing your provider to concentrate on managing your acute and chronic conditions during your regular appointments.  Please note that Annual Wellness Visits do not include a physical exam. Some assessments may be limited, especially if the visit was conducted virtually. If needed, we may recommend a separate in-person follow-up with your provider.  Ongoing Care Seeing your primary care provider every 3 to 6 months helps us  monitor your health and provide consistent, personalized care.   Referrals If a referral was made during today's visit and you haven't received any updates within two weeks, please contact the referred provider directly to check on the status.  Recommended Screenings:  Call to schedule a routine eye exam at your earliest convenience  Please call to schedule your bone density scan:  [facility] Address: [address], [place], [place] Phone: [phone]  [facility] [address], [address] [place], [place] [address] Phone: [phone]   Health Maintenance  Topic Date Due   Zoster (Shingles) Vaccine (1 of 2) Never done   COVID-19 Vaccine (3 - Moderna risk series) 03/20/2020   DEXA scan (bone density measurement)  06/13/2024   Flu Shot  07/12/2024   Medicare Annual Wellness Visit  08/20/2025   DTaP/Tdap/Td vaccine (3 - Td or Tdap) 07/19/2033   Pneumococcal Vaccine for age over 46  Completed   HPV Vaccine  Aged Out   Meningitis B Vaccine  Aged Out       08/20/2024   11:00 AM   Advanced Directives  Does Patient Have a Medical Advance Directive? Yes  Type of Advance Directive Healthcare Power of Attorney  Does patient want to make changes to medical advance directive? No - Patient declined  Copy of Healthcare Power of Attorney in Chart? No - copy requested   Advance Care Planning is important because it: Ensures you receive medical care that aligns with your values, goals, and preferences. Provides guidance to your family and loved ones, reducing the emotional burden of decision-making during critical moments.  Vision: Annual vision screenings are recommended for early detection of glaucoma, cataracts, and diabetic retinopathy. These exams can also reveal signs of chronic conditions such as diabetes and high blood pressure.  Dental: Annual dental screenings help detect early signs of oral cancer, gum disease, and other conditions linked to overall health, including heart disease and diabetes.  Please see the attached documents for additional preventive care recommendations.   Fall Prevention in the Home, Adult Falls can cause injuries and affect people of all ages. There are many simple things that you can do to make your home safe and to help prevent falls. If you need it, ask for help making these changes. What actions can I take to prevent falls? General information Use good lighting in all rooms. Make sure to: Replace any light bulbs that burn out. Turn on lights if it is dark and use night-lights. Keep items that you use often in easy-to-reach places. Lower the shelves around your home if needed. Move furniture so that there are clear  paths around it. Do not keep throw rugs or other things on the floor that can make you trip. If any of your floors are uneven, fix them. Add color or contrast paint or tape to clearly mark and help you see: Grab bars or handrails. First and last steps of staircases. Where the edge of each step is. If you use a ladder or  stepladder: Make sure that it is fully opened. Do not climb a closed ladder. Make sure the sides of the ladder are locked in place. Have someone hold the ladder while you use it. Know where your pets are as you move through your home. What can I do in the bathroom?     Keep the floor dry. Clean up any water that is on the floor right away. Remove soap buildup in the bathtub or shower. Buildup makes bathtubs and showers slippery. Use non-skid mats or decals on the floor of the bathtub or shower. Attach bath mats securely with double-sided, non-slip rug tape. If you need to sit down while you are in the shower, use a non-slip stool. Install grab bars by the toilet and in the bathtub and shower. Do not use towel bars as grab bars. What can I do in the bedroom? Make sure that you have a light by your bed that is easy to reach. Do not use any sheets or blankets on your bed that hang to the floor. Have a firm bench or chair with side arms that you can use for support when you get dressed. What can I do in the kitchen? Clean up any spills right away. If you need to reach something above you, use a sturdy step stool that has a grab bar. Keep electrical cables out of the way. Do not use floor polish or wax that makes floors slippery. What can I do with my stairs? Do not leave anything on the stairs. Make sure that you have a light switch at the top and the bottom of the stairs. Have them installed if you do not have them. Make sure that there are handrails on both sides of the stairs. Fix handrails that are broken or loose. Make sure that handrails are as long as the staircases. Install non-slip stair treads on all stairs in your home if they do not have carpet. Avoid having throw rugs at the top or bottom of stairs, or secure the rugs with carpet tape to prevent them from moving. Choose a carpet design that does not hide the edge of steps on the stairs. Make sure that carpet is firmly attached  to the stairs. Fix any carpet that is loose or worn. What can I do on the outside of my home? Use bright outdoor lighting. Repair the edges of walkways and driveways and fix any cracks. Clear paths of anything that can make you trip, such as tools or rocks. Add color or contrast paint or tape to clearly mark and help you see high doorway thresholds. Trim any bushes or trees on the main path into your home. Check that handrails are securely fastened and in good repair. Both sides of all steps should have handrails. Install guardrails along the edges of any raised decks or porches. Have leaves, snow, and ice cleared regularly. Use sand, salt, or ice melt on walkways during winter months if you live where there is ice and snow. In the garage, clean up any spills right away, including grease or oil spills. What other actions can I take?  Review your medicines with your health care provider. Some medicines can make you confused or feel dizzy. This can increase your chance of falling. Wear closed-toe shoes that fit well and support your feet. Wear shoes that have rubber soles and low heels. Use a cane, walker, scooter, or crutches that help you move around if needed. Talk with your provider about other ways that you can decrease your risk of falls. This may include seeing a physical therapist to learn to do exercises to improve movement and strength. Where to find more information Centers for Disease Control and Prevention, STEADI: TonerPromos.no [employer] on Aging: BaseRingTones.pl National Institute on Aging: BaseRingTones.pl Contact a health care provider if: You are afraid of falling at home. You feel weak, drowsy, or dizzy at home. You fall at home. Get help right away if you: Lose consciousness or have trouble moving after a fall. Have a fall that causes a head injury. These symptoms may be an emergency. Get help right away. Call 911. Do not wait to see if the symptoms will go away. Do not drive  yourself to the hospital. This information is not intended to replace advice given to you by your health care provider. Make sure you discuss any questions you have with your health care provider. Document Revised: 08/01/2022 Document Reviewed: 08/01/2022 Elsevier Patient Education  2024 [employer].

## 2024-08-20 NOTE — Progress Notes (Signed)
 Subjective:   Cynthia Hurst is a 89 y.o. who presents for a Medicare Wellness preventive visit.  As a reminder, Annual Wellness Visits don't include a physical exam, and some assessments may be limited, especially if this visit is performed virtually. We may recommend an in-person follow-up visit with your provider if needed.  Visit Complete: Virtual I connected with  Orlean CHRISTELLA Agent on 08/20/24 by a video and audio enabled telemedicine application and verified that I am speaking with the correct person using two identifiers.  Patient Location: Home  Provider Location: Office/Clinic  I discussed the limitations of evaluation and management by telemedicine. The patient expressed understanding and agreed to proceed.  Vital Signs: Because this visit was a virtual/telehealth visit, some criteria may be missing or patient reported. Any vitals not documented were not able to be obtained and vitals that have been documented are patient reported.    Persons Participating in Visit: Patient assisted by [place], daughter.  AWV Questionnaire: Yes: Patient Medicare AWV questionnaire was completed by the patient on 08/20/24; I have confirmed that all information answered by patient is correct and no changes since this date.  Cardiac Risk Factors include: advanced age (>35men, >30 women);hypertension     Objective:    Today's Vitals   08/20/24 1041  Weight: 155 lb (70.3 kg)  Height: 5' 3 (1.6 m)   Body mass index is 27.46 kg/m.     08/20/2024   11:00 AM 08/15/2023   10:47 AM 03/02/2023   10:08 AM 08/04/2022   12:36 PM 03/04/2022   10:08 AM 08/02/2021   11:20 AM 08/20/2020    9:44 AM  Advanced Directives  Does Patient Have a Medical Advance Directive? Yes No No No Yes Yes No  Type of Therapist, art of [place];Living will   Does patient want to make changes to medical advance directive? No - Patient declined  No - Patient declined       Copy of Healthcare Power of Attorney in Chart? No - copy requested     No - copy requested   Would patient like information on creating a medical advance directive?  Yes (MAU/Ambulatory/Procedural Areas - Information given)  No - Patient declined   No - Patient declined    Current Medications (verified) Outpatient Encounter Medications as of 08/20/2024  Medication Sig   alendronate  (FOSAMAX ) 70 MG tablet TAKE 1 TABLET EVERY 7 DAYS WITH A FULL GLASS OF WATER ON AN EMPTY STOMACH   allopurinol  (ZYLOPRIM ) 100 MG tablet TAKE 1 TABLET DAILY   Calcium Carb-Cholecalciferol (CALCIUM/VITAMIN D  PO) Take 1 tablet by mouth daily.   cholecalciferol (VITAMIN D ) 1000 UNITS tablet Take 1,000 Units by mouth daily.   cyanocobalamin  2000 MCG tablet Take 2,000 mcg by mouth daily.   donepezil  (ARICEPT ) 10 MG tablet Take 1 tablet (10 mg total) by mouth at bedtime.   KLOR-CON  M20 20 MEQ tablet TAKE 1 TABLET DAILY   NIFEdipine  (ADALAT  CC) 60 MG 24 hr tablet TAKE 1 TABLET DAILY   Lidocaine  3.5 % PTCH Apply 1 patch topically daily. (Patient not taking: Reported on 08/20/2024)   triamcinolone  cream (KENALOG ) 0.1 % Apply 1 Application topically 2 (two) times daily. (Patient not taking: Reported on 08/20/2024)   No facility-administered encounter medications on file as of 08/20/2024.    Allergies (verified) Patient has no known allergies.   History: Past Medical History:  Diagnosis Date   Chronic kidney disease  GERD (gastroesophageal reflux disease)    Gout    Hypertension    Hypopotassemia    Osteoarthritis    Osteoporosis    LUMBAR SPINE   Vitamin B 12 deficiency    Vitamin D  deficiency    Past Surgical History:  Procedure Laterality Date   ABDOMINAL HYSTERECTOMY     PARTIAL   EYE SURGERY Left 05/2018   cataract surgery    JOINT REPLACEMENT Bilateral 2010,2011   KNEE   Family History  Problem Relation Age of Onset   Heart disease Mother    Cancer Father    Alzheimer's disease Father    Breast  cancer Neg Hx    Social History   Socioeconomic History   Marital status: Married    Spouse name: Ryan   Number of children: 7   Years of education: Not on file   Highest education level: 12th grade  Occupational History   Occupation: retired    Comment: Psychiatric nurse  Tobacco Use   Smoking status: Never   Smokeless tobacco: Never  Vaping Use   Vaping status: Never Used  Substance and Sexual Activity   Alcohol use: No   Drug use: No   Sexual activity: Yes  Other Topics Concern   Not on file  Social History Narrative   Married, 7 children, 6 living 1 deceased   Social Drivers of Health   Financial Resource Strain: Medium Risk (08/20/2024)   Overall Financial Resource Strain (CARDIA)    Difficulty of Paying Living Expenses: Somewhat hard  Food Insecurity: No Food Insecurity (08/20/2024)   Hunger Vital Sign    Worried About Running Out of Food in the Last Year: Never true    Ran Out of Food in the Last Year: Never true  Transportation Needs: No Transportation Needs (08/20/2024)   PRAPARE - Administrator, Civil Service (Medical): No    Lack of Transportation (Non-Medical): No  Physical Activity: Inactive (08/20/2024)   Exercise Vital Sign    Days of Exercise per Week: 0 days    Minutes of Exercise per Session: 0 min  Stress: No Stress Concern Present (08/20/2024)   [employer] of Occupational Health - Occupational Stress Questionnaire    Feeling of Stress: Only a little  Social Connections: Moderately Integrated (08/20/2024)   Social Connection and Isolation Panel    Frequency of Communication with Friends and Family: More than three times a week    Frequency of Social Gatherings with Friends and Family: More than three times a week    Attends Religious Services: 1 to 4 times per year    Active Member of [employer] or Organizations: No    Attends Engineer, structural: Never    Marital Status: Married    Tobacco Counseling Counseling given: Not  Answered    Clinical Intake:  Pre-visit preparation completed: Yes  Pain : No/denies pain     BMI - recorded: 27.46 Nutritional Status: BMI 25 -29 Overweight Nutritional Risks: None Diabetes: No  Lab Results  Component Value Date   HGBA1C 5.5 04/14/2021     How often do you need to have someone help you when you read instructions, pamphlets, or other written materials from your doctor or pharmacy?: 3 - Sometimes  Interpreter Needed?: No  Information entered by :: Vina Ned, CMA   Activities of Daily Living     08/20/2024   10:47 AM 08/20/2024   10:09 AM  In your present state of health, do you have  any difficulty performing the following activities:  Hearing? 0 0  Vision? 0 0  Difficulty concentrating or making decisions? 1 1  Walking or climbing stairs? 0 1  Dressing or bathing? 0 1  Doing errands, shopping? 1 1  Comment doesn't drive, family takes to doctors appointments   Preparing Food and eating ? CINDERELLA CINDERELLA  Comment family prepares food   Using the Toilet? N N  In the past six months, have you accidently leaked urine? N N  Do you have problems with loss of bowel control? N N  Managing your Medications? Y Y  Managing your Finances? Y Y  Housekeeping or managing your Housekeeping? CINDERELLA CINDERELLA    Patient Care Team: [name], [name] T, NP as PCP - General (Nurse Practitioner) Whitfield Raisin, NP as Nurse Practitioner (Neurology) Pa, [facility] (Optometry) Nelwyn, Lynwood Hamilton, PT as Physical Therapist (Physical Therapy)  I have updated your Care Teams any recent Medical Services you may have received from other providers in the past year.     Assessment:   This is a routine wellness examination for [place].  Hearing/Vision screen Hearing Screening - Comments:: Denies hearing loss  Vision Screening - Comments:: Needs routine eye exam, [facility], [place]   Goals Addressed             This Visit's Progress    Patient Stated        Drink more water and eat more fruits and vegetables       Depression Screen     08/20/2024   10:56 AM 06/24/2024    3:52 PM 03/06/2024    3:44 PM 11/01/2023   10:00 AM 08/15/2023   10:45 AM 07/24/2023   11:28 AM 01/19/2023    9:10 AM  PHQ 2/9 Scores  PHQ - 2 Score 0 1 0 1 0 2 0  PHQ- 9 Score 3 7 0 2 0 6 2    Fall Risk     08/20/2024   11:02 AM 08/20/2024   10:09 AM 06/24/2024    3:52 PM 03/06/2024    3:42 PM 08/15/2023   10:51 AM  Fall Risk   Falls in the past year? 1 1 1 1 1   Number falls in past yr: 1 1 0 0 0  Injury with Fall? 1 1 1  0 1  Risk for fall due to : History of fall(s);Impaired balance/gait;Orthopedic patient;Mental status change;Impaired mobility   No Fall Risks History of fall(s);Mental status change  Follow up Falls evaluation completed;Education provided   Falls evaluation completed Falls evaluation completed;Falls prevention discussed    MEDICARE RISK AT HOME:  Medicare Risk at Home Any stairs in or around the home?: Yes (2 steps) If so, are there any without handrails?: Yes Home free of loose throw rugs in walkways, pet beds, electrical cords, etc?: Yes Adequate lighting in your home to reduce risk of falls?: Yes Life alert?: No Use of a cane, walker or w/c?: No Grab bars in the bathroom?: No Shower chair or bench in shower?: Yes Elevated toilet seat or a handicapped toilet?: No  TIMED UP AND GO:  Was the test performed?  No  Cognitive Function: 6CIT completed    09/27/2022   10:01 AM  MMSE - Mini Mental State Exam  Orientation to time 1  Orientation to Place 5  Registration 3  Attention/ Calculation 4  Recall 0  Language- name 2 objects 2  Language- repeat 1  Language- follow 3 step command  3  Language- read & follow direction 1  Write a sentence 1  Copy design 1  Total score 22      06/26/2023    2:40 PM 11/07/2022   11:16 AM  Montreal Cognitive Assessment   Visuospatial/ Executive (0/5) 2 2  Naming (0/3) 0 0  Attention: Read list of  digits (0/2) 1 2  Attention: Read list of letters (0/1) 1 1  Attention: Serial 7 subtraction starting at 100 (0/3) 0 0  Language: Repeat phrase (0/2) 2 2  Language : Fluency (0/1) 1 1  Abstraction (0/2) 2 1  Delayed Recall (0/5) 0 0  Orientation (0/6) 4 3  Total 13 12      08/20/2024   11:03 AM 08/15/2023   10:57 AM 07/24/2023   11:49 AM 08/04/2022   12:37 PM 08/02/2021   11:22 AM  6CIT Screen  What Year? 0 points 4 points 4 points 4 points 0 points  What month? 0 points 0 points 0 points 0 points 0 points  What time? 3 points 0 points 0 points 0 points 0 points  Count back from 20 0 points 0 points 0 points 0 points 0 points  Months in reverse 0 points 4 points 4 points 2 points 2 points  Repeat phrase 10 points 10 points 8 points 10 points 2 points  Total Score 13 points 18 points 16 points 16 points 4 points    Immunizations Immunization History  Administered Date(s) Administered   Fluad Quad(high Dose 65+) 10/01/2020, 10/18/2021, 01/19/2023   Moderna Sars-Covid-2 Vaccination 01/24/2020, 02/21/2020   Pneumococcal Conjugate-13 10/18/2021   Pneumococcal Polysaccharide-23 03/23/2005   Td 03/23/2005   Tdap 07/20/2023    Screening Tests Health Maintenance  Topic Date Due   Zoster Vaccines- Shingrix (1 of 2) Never done   COVID-19 Vaccine (3 - Moderna risk series) 03/20/2020   DEXA SCAN  06/13/2024   Influenza Vaccine  07/12/2024   Medicare Annual Wellness (AWV)  08/20/2025   DTaP/Tdap/Td (3 - Td or Tdap) 07/19/2033   Pneumococcal Vaccine: 50+ Years  Completed   HPV VACCINES  Aged Out   Meningococcal B Vaccine  Aged Out    Health Maintenance Items Addressed: DEXA ordered, See Nurse Notes at the end of this note  Additional Screening:  Vision Screening: Recommended annual ophthalmology exams for early detection of glaucoma and other disorders of the eye. Is the patient up to date with their annual eye exam?  No  Who is the provider or what is the name of the office in  which the patient attends annual eye exams? [facility], [place]  Dental Screening: Recommended annual dental exams for proper oral hygiene  Community Resource Referral / Chronic Care Management: CRR required this visit?  No   CCM required this visit?  No   Plan:    I have personally reviewed and noted the following in the patient's chart:   Medical and social history Use of alcohol, tobacco or illicit drugs  Current medications and supplements including opioid prescriptions. Patient is not currently taking opioid prescriptions. Functional ability and status Nutritional status Physical activity Advanced directives List of other physicians Hospitalizations, surgeries, and ER visits in previous 12 months Vitals Screenings to include cognitive, depression, and falls Referrals and appointments  In addition, I have reviewed and discussed with patient certain preventive protocols, quality metrics, and best practice recommendations. A written personalized care plan for preventive services as well as general preventive health recommendations were provided to  patient.   Vina Ned, CMA   08/20/2024   After Visit Summary: (MyChart) Due to this being a telephonic visit, the after visit summary with patients personalized plan was offered to patient via MyChart   Notes:  [place], patient's daughter assisted with today's visit 6 CIT Score - 13 Needs routine eye exam. Family will call to schedule Placed order for DEXA Screening colonoscopy and MMG no longer recommended due to age Will get Flu vaccine at next OV on 09/09/24 Declined Shingles and Covid vaccines

## 2024-08-22 ENCOUNTER — Telehealth: Payer: Self-pay | Admitting: Nurse Practitioner

## 2024-08-22 NOTE — Telephone Encounter (Unsigned)
 Copied from CRM [id number]. Topic: General - Other >> Aug 22, 2024 11:46 AM Zebedee SAUNDERS wrote: Reason for CRM: Pt's daughter Cynthia Hurst [phone] stated pt was tested positive for Covid on 08/21/2024 would like medication sent to pharmacy [facility] [id number] [name]) [place] [address] Phone: [phone] Fax: [phone] and a call back at [phone].

## 2024-08-22 NOTE — Telephone Encounter (Signed)
 Forwarding for assistance with scheduling. We can not send a prescription without patient being seen. Can be seen in office or virtual. Could also offer same options for patient for UC as well. Visit needs to be within 5 days of symptoms onset.

## 2024-08-23 ENCOUNTER — Encounter: Payer: Self-pay | Admitting: Nurse Practitioner

## 2024-08-23 ENCOUNTER — Telehealth (INDEPENDENT_AMBULATORY_CARE_PROVIDER_SITE_OTHER): Admitting: Nurse Practitioner

## 2024-08-23 DIAGNOSIS — U071 COVID-19: Secondary | ICD-10-CM | POA: Diagnosis not present

## 2024-08-23 NOTE — Patient Instructions (Signed)
 COVID-19: What to Know COVID-19 is an infection caused by a virus called SARS-CoV-2. This type of virus is called a coronavirus. People with COVID-19 may: Have few to no symptoms. Have mild to moderate symptoms that affect their lungs and breathing. Get very sick. What are the causes?  COVID-19 is caused by a virus. This virus may be in the air as droplets or on surfaces. It can spread from an infected person when they cough, sneeze, speak, sing, or breathe. You may become infected if: You breathe in the infected droplets in the air. You touch an object that has the virus on it. What increases the risk? You are at risk of getting COVID-19 if you have been around someone with the infection. You may be more likely to get very sick if: You are 87 years old or older. You have certain medical conditions, such as: Heart disease. Diabetes. Long-term respiratory disease. Cancer. Pregnancy. You are immunocompromised. This means your body can't fight infections easily. You have a disability that makes it hard for you to move around, you have trouble moving, or you can't move at all. What are the signs or symptoms? People may have different symptoms from COVID-19. The symptoms can also be mild to very bad. They often show up in 5-6 days after being infected. But, they can take up to 14 days to appear. Common symptoms are: Cough. Feeling tired. New loss of taste or smell. Fever. Less common symptoms are: Sore throat. Headache. Body or muscle aches. Diarrhea. A skin rash or fingers or toes that are a different color than usual. Red or irritated eyes. Sometimes, COVID-19 does not cause symptoms. How is this diagnosed? COVID-19 can be diagnosed with tests done in the lab or at home. Fluid from your nose, mouth, or lungs will be used to check for the virus. How is this treated? Treatment for COVID-19 depends on how sick you are. Mild symptoms can be treated at home with rest, fluids, and  over-the-counter medicines. very bad symptoms may be treated in a hospital intensive care unit (ICU). If you have symptoms and are at risk of getting very sick, you may be given a medicine that fights viruses. This medicine is called an antiviral. How is this prevented? To protect yourself from COVID-19: Know your risk factors. Get vaccinated. If your body can't fight infections easily, talk to your provider about treatment to help prevent COVID-19. Stay at least about 3 feet (1 meter) away from other people. Wear mask that fits well when: You can't stay at a distance from people. You're in a place with not a lot of air flow. Try to be in open spaces with good air flow when you are in public. Wash your hands often or use an alcohol-based hand sanitizer. Cover your nose and mouth when you cough or sneeze. If you think you have COVID-19 or have been around someone who has it, stay home and away from other people as told by your provider or health officials. Where to find more information To learn more: Go to [employer] Click Health Topics. Type COVID-19 in the search box. Go to VisitDestination.com.br Click Health Topics. Then click All Topics. Type COVID-19 in the search box. Get help right away if: You have trouble breathing or get short of breath. You have pain or pressure in your chest. You're feeling confused. These symptoms may be an emergency. Get help right away. Call 911. Do not wait to see if the symptoms will go away.  Do not drive yourself to the hospital. This information is not intended to replace advice given to you by your health care provider. Make sure you discuss any questions you have with your health care provider. Document Revised: 08/31/2023 Document Reviewed: 08/23/2023 Elsevier Patient Education  2025 [employer].

## 2024-08-23 NOTE — Telephone Encounter (Signed)
 Noted

## 2024-08-23 NOTE — Assessment & Plan Note (Signed)
 Symptoms or 5 days and overall stable with no worsening.  At this time they have decided to hold off on Paxlovid since overall patient is stable and improving.  Educated them on Paxlovid.  They will continue OTC Mucinex and start Airborne to provide Vitamin C and Zinc.  Recommend increased hydration and rest.

## 2024-08-23 NOTE — Progress Notes (Signed)
 LMP  (LMP Unknown)    Subjective:    Patient ID: Cynthia Hurst, female    DOB: 1935-10-24, 89 y.o.   MRN: [id number]  HPI: Cynthia Hurst is a 89 y.o. female  Chief Complaint  Patient presents with   Covid Positive    Tested + Wednesday 08/21/2024. Congestion, minor sore throat, frog in her throat, cold chills, headaches, body aches, some minor fatigue.     Virtual Visit via Video Note  I connected with Orlean CHRISTELLA Agent on 08/23/24 at  4:00 PM EDT by a video enabled telemedicine application and verified that I am speaking with the correct person using two identifiers.  Location: Patient: home Provider: work   I discussed the limitations of evaluation and management by telemedicine and the availability of in person appointments. The patient expressed understanding and agreed to proceed.  I discussed the assessment and treatment plan with the patient. The patient was provided an opportunity to ask questions and all were answered. The patient agreed with the plan and demonstrated an understanding of the instructions.   The patient was advised to call back or seek an in-person evaluation if the symptoms worsen or if the condition fails to improve as anticipated.  I provided 25 minutes of non-face-to-face time during this encounter.   Kit Mollett T Jet Armbrust, NP   Daughter at bedside to assist with HPI.  UPPER RESPIRATORY TRACT INFECTION Started with symptoms on Monday, had headaches last week. Overall patient and daughter feels she is doing well.  No worsening of symptoms.  She is on day 5. Tested positive on 08/21/24. Fever: no Cough: yes Shortness of breath: occasional Wheezing: no Chest pain: no Chest tightness: no Chest congestion: no Nasal congestion: yes Runny nose: yes Post nasal drip: yes Sneezing: no Sore throat: no Swollen glands: no Sinus pressure: yes Headache: yes Face pain: no Toothache: no Ear pain: none Ear pressure: none Eyes red/itching:no Eye  drainage/crusting: no  Vomiting: no Rash: no Fatigue: yes Sick contacts: yes Strep contacts: no  Context: stable Recurrent sinusitis: no Relief with OTC cold/cough medications: yes  Treatments attempted: Mucinex all in one    Relevant past medical, surgical, family and social history reviewed and updated as indicated. Interim medical history since our last visit reviewed. Allergies and medications reviewed and updated.  Review of Systems  Constitutional:  Positive for fatigue. Negative for activity change, appetite change, chills and fever.  HENT:  Positive for congestion, postnasal drip, rhinorrhea and sinus pressure. Negative for ear discharge, ear pain, facial swelling, sinus pain, sneezing, sore throat and voice change.   Respiratory:  Positive for cough and shortness of breath (a little noticed). Negative for chest tightness and wheezing.   Cardiovascular:  Negative for chest pain, palpitations and leg swelling.  Gastrointestinal: Negative.   Endocrine: Negative.   Neurological:  Positive for headaches. Negative for dizziness and numbness.  Psychiatric/Behavioral: Negative.      Per HPI unless specifically indicated above     Objective:    LMP  (LMP Unknown)   Wt Readings from Last 3 Encounters:  08/20/24 155 lb (70.3 kg)  06/24/24 150 lb (68 kg)  03/06/24 149 lb 12.8 oz (67.9 kg)    Physical Exam Vitals and nursing note reviewed.  Constitutional:      General: She is awake. She is not in acute distress.    Appearance: She is well-developed and well-groomed. She is not ill-appearing or toxic-appearing.  HENT:     Head: Normocephalic.  Right Ear: Hearing normal.     Left Ear: Hearing normal.  Eyes:     General: Lids are normal.        Right eye: No discharge.        Left eye: No discharge.     Conjunctiva/sclera: Conjunctivae normal.  Pulmonary:     Effort: Pulmonary effort is normal. No accessory muscle usage or respiratory distress.  Musculoskeletal:      Cervical back: Normal range of motion.  Neurological:     Mental Status: She is alert and oriented to person, place, and time.  Psychiatric:        Attention and Perception: Attention normal.        Mood and Affect: Mood normal.        Behavior: Behavior normal. Behavior is cooperative.        Thought Content: Thought content normal.        Judgment: Judgment normal.     Results for orders placed or performed in visit on 03/06/24  CBC with Differential/Platelet   Collection Time: 03/06/24  4:16 PM  Result Value Ref Range   WBC 5.9 3.4 - 10.8 x10E3/uL   RBC 4.34 3.77 - 5.28 x10E6/uL   Hemoglobin 13.2 11.1 - 15.9 g/dL   Hematocrit 59.8 65.9 - 46.6 %   MCV 92 79 - 97 fL   MCH 30.4 26.6 - 33.0 pg   MCHC 32.9 31.5 - 35.7 g/dL   RDW 86.1 88.2 - 84.5 %   Platelets 191 150 - 450 x10E3/uL   Neutrophils 68 Not Estab. %   Lymphs 22 Not Estab. %   Monocytes 8 Not Estab. %   Eos 1 Not Estab. %   Basos 0 Not Estab. %   Neutrophils Absolute 4.1 1.4 - 7.0 x10E3/uL   Lymphocytes Absolute 1.3 0.7 - 3.1 x10E3/uL   Monocytes Absolute 0.5 0.1 - 0.9 x10E3/uL   EOS (ABSOLUTE) 0.0 0.0 - 0.4 x10E3/uL   Basophils Absolute 0.0 0.0 - 0.2 x10E3/uL   Immature Granulocytes 1 Not Estab. %   Immature Grans (Abs) 0.0 0.0 - 0.1 x10E3/uL  Comprehensive metabolic panel   Collection Time: 03/06/24  4:16 PM  Result Value Ref Range   Glucose 148 (H) 70 - 99 mg/dL   BUN 15 8 - 27 mg/dL   Creatinine, Ser 8.79 (H) 0.57 - 1.00 mg/dL   eGFR 44 (L) >40 fO/fpw/8.26   BUN/Creatinine Ratio 13 12 - 28   Sodium 143 134 - 144 mmol/L   Potassium 3.9 3.5 - 5.2 mmol/L   Chloride 102 96 - 106 mmol/L   CO2 27 20 - 29 mmol/L   Calcium 10.3 8.7 - 10.3 mg/dL   Total Protein 6.7 6.0 - 8.5 g/dL   Albumin 4.2 3.7 - 4.7 g/dL   Globulin, Total 2.5 1.5 - 4.5 g/dL   Bilirubin Total 0.8 0.0 - 1.2 mg/dL   Alkaline Phosphatase 85 44 - 121 IU/L   AST 23 0 - 40 IU/L   ALT 11 0 - 32 IU/L  Uric acid   Collection Time: 03/06/24   4:16 PM  Result Value Ref Range   Uric Acid 4.1 3.1 - 7.9 mg/dL  VITAMIN D  25 Hydroxy (Vit-D Deficiency, Fractures)   Collection Time: 03/06/24  4:16 PM  Result Value Ref Range   Vit D, 25-Hydroxy 57.6 30.0 - 100.0 ng/mL  Vitamin B12   Collection Time: 03/06/24  4:16 PM  Result Value Ref Range   Vitamin B-12 >2000 (H)  232 - 1245 pg/mL  TSH   Collection Time: 03/06/24  4:16 PM  Result Value Ref Range   TSH 2.000 0.450 - 4.500 uIU/mL      Assessment & Plan:   Problem List Items Addressed This Visit       Other   Lab test positive for detection of COVID-19 virus - Primary   Symptoms or 5 days and overall stable with no worsening.  At this time they have decided to hold off on Paxlovid since overall patient is stable and improving.  Educated them on Paxlovid.  They will continue OTC Mucinex and start Airborne to provide Vitamin C and Zinc.  Recommend increased hydration and rest.        Follow up plan: Return if symptoms worsen or fail to improve.

## 2024-09-08 NOTE — Patient Instructions (Signed)
 Please call to schedule your mammogram and/or bone density: [facility] at [facility]  Address: [address], [place], [place] [address] Phone: [phone]  [facility] at [facility] [address] [place],  [place]  [address] Phone: [phone]    Eating Plan for Osteoporosis Osteoporosis causes your bones to become weak and brittle. This puts you at greater risk for bone breaks (fractures) from small bumps or falls. Making changes to your diet and increasing your physical activity can help strengthen your bones and improve your overall health. Calcium and vitamin D are nutrients that play an important role in bone health. Vitamin D helps your body use calcium and strengthen bones. It is important to get enough calcium and vitamin D as part of your eating plan for osteoporosis. What are tips for following this plan? Reading food labels Try to get at least 1,000 milligrams (mg) of calcium each day. Look for foods that have at least 50 mg of calcium per serving. Talk with your health care provider about taking a calcium supplement if you do not get enough calcium from food. Do not have more than 2,500 mg of calcium each day. This is the upper limit for food and nutritional supplements combined. Too much calcium may cause constipation and prevent you from absorbing other important nutrients. Choose foods that contain vitamin D. Take a daily vitamin supplement that contains 800-1,000 international units (IU) of vitamin D. The amount may be different depending on your age, body weight, and where you live. Talk with your dietitian or health care provider about how much vitamin D is right for you. Avoid foods that have more than 300 mg of sodium per serving. Too much sodium can cause your body to lose calcium. Talk with your dietitian or health care provider about how much sodium you are allowed each day. Shopping Do not buy foods with added  salt, including: Salted snacks. Rosita Fire. Canned soups. Canned meats. Processed meats, such as bacon or precooked or cured meat like sausages or meat loaves. Smoked fish. Meal planning Eat balanced meals that contain protein foods, fruits and vegetables, and foods rich in calcium and vitamin D. Eat at least 5 servings of fruits and vegetables each day. Eat 5-6 oz (142-170 g) of lean meat, poultry, fish, eggs, or beans each day. Lifestyle Do not use any products that contain nicotine or tobacco, such as cigarettes, e-cigarettes, and chewing tobacco. If you need help quitting, ask your health care provider. If your health care provider recommends that you lose weight: Work with a dietitian to develop an eating plan that will help you reach your desired weight goal. Exercise for at least 30 minutes a day, 5 or more days a week, or as told by your health care provider. Work with a physical therapist to develop an exercise plan that includes flexibility, balance, and strength exercises. Do not focus only on aerobic exercise. Do not drink alcohol if: Your health care provider tells you not to drink. You are pregnant, may be pregnant, or are planning to become pregnant. If you drink alcohol: Limit how much you use to: 0-1 drink a day for women. 0-2 drinks a day for men. Be aware of how much alcohol is in your drink. In the [place] one drink equals one 12 oz bottle of beer (355 mL), one 5 oz glass of wine (148 mL), or one 1 oz glass of hard liquor (44 mL). What foods should I eat?  Foods high in calcium  Yogurt. Yogurt with fruit. Milk. Evaporated skim milk. Dry milk powder. Calcium-fortified orange juice. Parmesan cheese. Part-skim ricotta cheese. Natural hard cheese. Cream cheese. Cottage cheese. Canned sardines. Canned salmon. Calcium-treated tofu. Calcium-fortified cereal bar. Calcium-fortified cereal. Calcium-fortified graham crackers. Cooked collard greens. Turnip greens. Broccoli.  Kale. Almonds. White beans. Corn tortilla. Foods high in vitamin D Cod liver oil. Fatty fish, such as tuna, mackerel, and salmon. Milk. Fortified soy milk. Fortified fruit juice. Yogurt. Margarine. Egg yolks. Foods high in protein Beef. Lamb. Pork tenderloin. Chicken breast. Tuna (canned). Fish fillet. Tofu. Cooked soy beans. Soy patty. Beans (canned or cooked). Cottage cheese. Yogurt. Peanut butter. Pumpkin seeds. Nuts. Sunflower seeds. Hard cheese. Milk or other milk products, such as soy milk. The items listed above may not be a complete list of foods and beverages you can eat. Contact a dietitian for more options. Summary Calcium and vitamin D are nutrients that play an important role in bone health and are an important part of your eating plan for osteoporosis. Eat balanced meals that contain protein foods, fruits and vegetables, and foods rich in calcium and vitamin D. Avoid foods that have more than 300 mg of sodium per serving. Too much sodium can cause your body to lose calcium. Exercise is an important part of prevention and treatment of osteoporosis. Aim for at least 30 minutes a day, 5 days a week. This information is not intended to replace advice given to you by your health care provider. Make sure you discuss any questions you have with your health care provider. Document Revised: 05/14/2020 Document Reviewed: 05/14/2020 Elsevier Patient Education  2024 [employer].

## 2024-09-09 ENCOUNTER — Encounter: Payer: Self-pay | Admitting: Nurse Practitioner

## 2024-09-09 ENCOUNTER — Ambulatory Visit: Admitting: Nurse Practitioner

## 2024-09-09 VITALS — BP 122/57 | HR 76 | Ht 63.0 in | Wt 149.6 lb

## 2024-09-09 DIAGNOSIS — N1831 Chronic kidney disease, stage 3a: Secondary | ICD-10-CM | POA: Diagnosis not present

## 2024-09-09 DIAGNOSIS — Z23 Encounter for immunization: Secondary | ICD-10-CM

## 2024-09-09 DIAGNOSIS — M810 Age-related osteoporosis without current pathological fracture: Secondary | ICD-10-CM | POA: Diagnosis not present

## 2024-09-09 DIAGNOSIS — M1A0710 Idiopathic chronic gout, right ankle and foot, without tophus (tophi): Secondary | ICD-10-CM | POA: Diagnosis not present

## 2024-09-09 DIAGNOSIS — R011 Cardiac murmur, unspecified: Secondary | ICD-10-CM

## 2024-09-09 DIAGNOSIS — D472 Monoclonal gammopathy: Secondary | ICD-10-CM

## 2024-09-09 DIAGNOSIS — I129 Hypertensive chronic kidney disease with stage 1 through stage 4 chronic kidney disease, or unspecified chronic kidney disease: Secondary | ICD-10-CM | POA: Diagnosis not present

## 2024-09-09 DIAGNOSIS — F0280 Dementia in other diseases classified elsewhere without behavioral disturbance: Secondary | ICD-10-CM

## 2024-09-09 DIAGNOSIS — G309 Alzheimer's disease, unspecified: Secondary | ICD-10-CM | POA: Diagnosis not present

## 2024-09-09 MED ORDER — ALLOPURINOL 100 MG PO TABS: 100.0000 mg | ORAL_TABLET | Freq: Every day | ORAL | 3 refills | Status: AC

## 2024-09-09 MED ORDER — NIFEDIPINE ER 60 MG PO TB24: 60.0000 mg | ORAL_TABLET | Freq: Every day | ORAL | 3 refills | Status: AC

## 2024-09-09 MED ORDER — POTASSIUM CHLORIDE CRYS ER 20 MEQ PO TBCR: 20.0000 meq | EXTENDED_RELEASE_TABLET | Freq: Every day | ORAL | 3 refills | Status: AC

## 2024-09-09 MED ORDER — DONEPEZIL HCL 10 MG PO TABS: 10.0000 mg | ORAL_TABLET | Freq: Every day | ORAL | 3 refills | Status: AC

## 2024-09-09 NOTE — Assessment & Plan Note (Signed)
 Chronic, stable.  BP below goal in office today.  Continue current medication regimen and adjust as needed -- could consider reduction of medication in future if BP remains stable.  LABS: CBC and CMP.  Renal dose medications as needed.  Recommend she continue to monitor BP at home at least a few days a week and document + focus on DASH diet.

## 2024-09-09 NOTE — Assessment & Plan Note (Signed)
 Ongoing.  Will continue Fosamax  at this time and plan on repeat DEXA this year, discussed with daughter. No falls or fractures.  Continue supplements.

## 2024-09-09 NOTE — Assessment & Plan Note (Signed)
Chronic, ongoing.  Continue collaboration with oncology, recent note reviewed. 

## 2024-09-09 NOTE — Assessment & Plan Note (Signed)
 Noted on exam, continue to monitor.  No symptoms. Echo as needed.

## 2024-09-09 NOTE — Assessment & Plan Note (Signed)
Chronic, stable with Allopurinol at 100 MG.  Continue current regimen and adjust as needed.   Uric acid level today. 

## 2024-09-09 NOTE — Assessment & Plan Note (Signed)
Chronic with CKD 3a.  Monitor BMP/CMP every 6 months and refer to nephrology as needed if worsening CKD.  Renal dose medications as needed.

## 2024-09-09 NOTE — Progress Notes (Addendum)
 BP (!) 122/57 (BP Location: Right Arm, Patient Position: Sitting, Cuff Size: Large)   Pulse 76   Ht 5' 3 (1.6 m)   Wt 149 lb 9.6 oz (67.9 kg)   LMP  (LMP Unknown)   SpO2 96%   BMI 26.50 kg/m    Subjective:    Patient ID: Cynthia Hurst Agent, female    DOB: Jul 25, 1935, 89 y.o.   MRN: [id number]  HPI: Cynthia Hurst is a 89 y.o. female.   Chief Complaint  Patient presents with   Follow-up   Osteoporosis   Headache    Stress induced    Hypertension   Husband and daughter at bedside.  CHRONIC KIDNEY DISEASE (3a) AND MGUS Saw oncology last on 03/06/24. Renal ultrasound on 02/14/2020 with normal findings, right-sided renal cysts measuring 1.8 cm and questionable mild bladder wall thickening. Denies any B symptoms at this time.  Continues Allopurinol  for gout history. CKD status: stable Medications renally dose: yes Previous renal evaluation: no Pneumovax:  Up to Date Influenza Vaccine:  Up to Date   OSTEOPOROSIS Taking Fosamax .  DEXA 06/13/22 noted osteoporosis, previous had shown osteopenia.  No recent falls or fractures. Satisfied with current treatment?: yes Medication side effects: no Medication compliance: good compliance Adequate calcium & vitamin D : yes Intolerance to bisphosphonates:no Weight bearing exercises: no   DEMENTIA + HTN Continues on Aricept  10 MG at bedtime.  Neurology visit last on 06/26/23. Continues to live with her husband, children check on them often. Children do all the driving. Meals on Wheels on pause as patient did not like the meals. Eats breakfast, they think she might skip some days, and eats dinner.  Snacks some in between. No incontinence issues present. Is continuing to miss medications on occasion.  Family history in parents of dementia. Continues to have occasional headaches, family thinks it is stress related due to her caring for husband. She is beginning to repeat questions often, not recalling the answers.   Date of diagnosis: Last  imaging: Neurology visits: Current memory care medications: Medication compliance: Family support: Living situation: Recent Falls: Incontinent/Continent: Meal intake: Behaviors: Sleep pattern:     09/27/2022   10:01 AM  MMSE - Mini Mental State Exam  Orientation to time 1  Orientation to Place 5  Registration 3  Attention/ Calculation 4  Recall 0  Language- name 2 objects 2  Language- repeat 1  Language- follow 3 step command 3  Language- read & follow direction 1  Write a sentence 1  Copy design 1  Total score 22       08/20/2024   11:03 AM 08/15/2023   10:57 AM 07/24/2023   11:49 AM 08/04/2022   12:37 PM 08/02/2021   11:22 AM  6CIT Screen  What Year? 0 points 4 points 4 points 4 points 0 points  What month? 0 points 0 points 0 points 0 points 0 points  What time? 3 points 0 points 0 points 0 points 0 points  Count back from 20 0 points 0 points 0 points 0 points 0 points  Months in reverse 0 points 4 points 4 points 2 points 2 points  Repeat phrase 10 points 10 points 8 points 10 points 2 points  Total Score 13 points 18 points 16 points 16 points 4 points      09/09/2024    2:07 PM 08/20/2024   10:56 AM 06/24/2024    3:52 PM 03/06/2024    3:44 PM 11/01/2023   10:00 AM  Depression screen PHQ 2/9  Decreased Interest 0 0 1 0 1  Down, Depressed, Hopeless 0 0 0 0 0  PHQ - 2 Score 0 0 1 0 1  Altered sleeping 0 0 0 0 0  Tired, decreased energy 0 0 2 0 1  Change in appetite 0 1 2 0 0  Feeling bad or failure about yourself  0 0 0 0 0  Trouble concentrating 0 2 2 0 0  Moving slowly or fidgety/restless 0 0 0 0 0  Suicidal thoughts 0 0 0 0 0  PHQ-9 Score 0 3 7 0 2  Difficult doing work/chores  Somewhat difficult Somewhat difficult Not difficult at all Not difficult at all       09/09/2024    2:06 PM 06/24/2024    3:53 PM 03/06/2024    3:44 PM 11/01/2023   10:01 AM  GAD 7 : Generalized Anxiety Score  Nervous, Anxious, on Edge 0 2 0 0  Control/stop worrying 0 1 0 0   Worry too much - different things 0 1 0 0  Trouble relaxing 0 2 0 0  Restless 0 0 0 0  Easily annoyed or irritable 0 1 0 0  Afraid - awful might happen 0 0 0 0  Total GAD 7 Score 0 7 0 0  Anxiety Difficulty  Somewhat difficult Not difficult at all Not difficult at all      Relevant past medical, surgical, family and social history reviewed and updated as indicated. Interim medical history since our last visit reviewed. Allergies and medications reviewed and updated.  Review of Systems  Constitutional:  Negative for activity change, appetite change, diaphoresis, fatigue and fever.  Respiratory:  Negative for cough, chest tightness and shortness of breath.   Cardiovascular:  Negative for chest pain, palpitations and leg swelling.  Gastrointestinal: Negative.   Musculoskeletal:  Negative for back pain.  Neurological:  Positive for headaches. Negative for dizziness, syncope, speech difficulty, weakness and numbness.  Psychiatric/Behavioral: Negative.     Per HPI unless specifically indicated above     Objective:    BP (!) 122/57 (BP Location: Right Arm, Patient Position: Sitting, Cuff Size: Large)   Pulse 76   Ht 5' 3 (1.6 m)   Wt 149 lb 9.6 oz (67.9 kg)   LMP  (LMP Unknown)   SpO2 96%   BMI 26.50 kg/m   Wt Readings from Last 3 Encounters:  09/09/24 149 lb 9.6 oz (67.9 kg)  08/20/24 155 lb (70.3 kg)  06/24/24 150 lb (68 kg)    Physical Exam Vitals and nursing note reviewed.  Constitutional:      General: She is awake. She is not in acute distress.    Appearance: Normal appearance. She is well-developed and well-groomed. She is not ill-appearing or toxic-appearing.  HENT:     Head: Normocephalic.     Right Ear: Hearing and external ear normal.     Left Ear: Hearing and external ear normal.  Eyes:     General: Lids are normal.        Right eye: No discharge.        Left eye: No discharge.     Conjunctiva/sclera: Conjunctivae normal.     Pupils: Pupils are equal,  round, and reactive to light.  Neck:     Thyroid : No thyromegaly.     Vascular: No carotid bruit.  Cardiovascular:     Rate and Rhythm: Normal rate and regular rhythm.     Heart sounds:  Murmur heard.     Systolic murmur is present with a grade of 2/6.     No gallop.  Pulmonary:     Effort: Pulmonary effort is normal. No accessory muscle usage or respiratory distress.     Breath sounds: Normal breath sounds.  Abdominal:     General: Bowel sounds are normal. There is no distension.     Palpations: Abdomen is soft.     Tenderness: There is no abdominal tenderness.  Musculoskeletal:     Cervical back: Normal range of motion and neck supple.     Right lower leg: No edema.     Left lower leg: No edema.     Comments: Kyphosis noted.  Lymphadenopathy:     Cervical: No cervical adenopathy.  Skin:    General: Skin is warm and dry.  Neurological:     Mental Status: She is alert.     Deep Tendon Reflexes: Reflexes are normal and symmetric.     Reflex Scores:      Brachioradialis reflexes are 2+ on the right side and 2+ on the left side.      Patellar reflexes are 2+ on the right side and 2+ on the left side.    Comments: Pleasantly confused.  Able to report month, but not day and year.  Unable to state who President is.  Psychiatric:        Attention and Perception: Attention normal.        Mood and Affect: Mood normal.        Speech: Speech normal.        Behavior: Behavior normal. Behavior is cooperative.        Thought Content: Thought content normal.        Cognition and Memory: Memory is impaired.     Results for orders placed or performed in visit on 03/06/24  CBC with Differential/Platelet   Collection Time: 03/06/24  4:16 PM  Result Value Ref Range   WBC 5.9 3.4 - 10.8 x10E3/uL   RBC 4.34 3.77 - 5.28 x10E6/uL   Hemoglobin 13.2 11.1 - 15.9 g/dL   Hematocrit 59.8 65.9 - 46.6 %   MCV 92 79 - 97 fL   MCH 30.4 26.6 - 33.0 pg   MCHC 32.9 31.5 - 35.7 g/dL   RDW 86.1 88.2 -  84.5 %   Platelets 191 150 - 450 x10E3/uL   Neutrophils 68 Not Estab. %   Lymphs 22 Not Estab. %   Monocytes 8 Not Estab. %   Eos 1 Not Estab. %   Basos 0 Not Estab. %   Neutrophils Absolute 4.1 1.4 - 7.0 x10E3/uL   Lymphocytes Absolute 1.3 0.7 - 3.1 x10E3/uL   Monocytes Absolute 0.5 0.1 - 0.9 x10E3/uL   EOS (ABSOLUTE) 0.0 0.0 - 0.4 x10E3/uL   Basophils Absolute 0.0 0.0 - 0.2 x10E3/uL   Immature Granulocytes 1 Not Estab. %   Immature Grans (Abs) 0.0 0.0 - 0.1 x10E3/uL  Comprehensive metabolic panel   Collection Time: 03/06/24  4:16 PM  Result Value Ref Range   Glucose 148 (H) 70 - 99 mg/dL   BUN 15 8 - 27 mg/dL   Creatinine, Ser 8.79 (H) 0.57 - 1.00 mg/dL   eGFR 44 (L) >40 fO/fpw/8.26   BUN/Creatinine Ratio 13 12 - 28   Sodium 143 134 - 144 mmol/L   Potassium 3.9 3.5 - 5.2 mmol/L   Chloride 102 96 - 106 mmol/L   CO2 27 20 - 29 mmol/L  Calcium 10.3 8.7 - 10.3 mg/dL   Total Protein 6.7 6.0 - 8.5 g/dL   Albumin 4.2 3.7 - 4.7 g/dL   Globulin, Total 2.5 1.5 - 4.5 g/dL   Bilirubin Total 0.8 0.0 - 1.2 mg/dL   Alkaline Phosphatase 85 44 - 121 IU/L   AST 23 0 - 40 IU/L   ALT 11 0 - 32 IU/L  Uric acid   Collection Time: 03/06/24  4:16 PM  Result Value Ref Range   Uric Acid 4.1 3.1 - 7.9 mg/dL  VITAMIN D  25 Hydroxy (Vit-D Deficiency, Fractures)   Collection Time: 03/06/24  4:16 PM  Result Value Ref Range   Vit D, 25-Hydroxy 57.6 30.0 - 100.0 ng/mL  Vitamin B12   Collection Time: 03/06/24  4:16 PM  Result Value Ref Range   Vitamin B-12 >2000 (H) 232 - 1245 pg/mL  TSH   Collection Time: 03/06/24  4:16 PM  Result Value Ref Range   TSH 2.000 0.450 - 4.500 uIU/mL      Assessment & Plan:   Problem List Items Addressed This Visit       Cardiovascular and Mediastinum   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  BP below goal in office today.  Continue current medication regimen and adjust as needed -- could consider reduction of medication  in future if BP remains stable.  LABS: CBC and CMP.  Renal dose medications as needed.  Recommend she continue to monitor BP at home at least a few days a week and document + focus on DASH diet.        Relevant Medications   NIFEdipine  (ADALAT  CC) 60 MG 24 hr tablet   Other Relevant Orders   Comprehensive metabolic panel with GFR   Lipid Panel w/o Chol/HDL Ratio     Nervous and Auditory   Alzheimer disease (HCC) - Primary   Chronic, progressive.  Continue Aricept  daily and collaboration with neurology.  Recent notes reviewed. Continue OT and PT as needed, she does well with this.      Relevant Medications   donepezil  (ARICEPT ) 10 MG tablet     Musculoskeletal and Integument   Age related osteoporosis (Chronic)   Ongoing.  Will continue Fosamax  at this time and plan on repeat DEXA this year, discussed with daughter. No falls or fractures.  Continue supplements.        Relevant Orders   DG Bone Density     Genitourinary   CKD (chronic kidney disease), stage III (HCC) (Chronic)   Chronic with CKD 3a.  Monitor BMP/CMP every 6 months and refer to nephrology as needed if worsening CKD.  Renal dose medications as needed.        Relevant Orders   Comprehensive metabolic panel with GFR   CBC with Differential/Platelet     Other   MGUS (monoclonal gammopathy of unknown significance) (Chronic)   Chronic, ongoing.  Continue collaboration with oncology, recent note reviewed.      Heart murmur (Chronic)   Noted on exam, continue to monitor.  No symptoms. Echo as needed.      Gout (Chronic)   Chronic, stable with Allopurinol  at 100 MG.  Continue current regimen and adjust as needed.   Uric acid level today.      Relevant Orders   Uric acid      Follow up plan: Return in about 6 months (around 03/09/2025) for HTN/HLD, CKD, OSTEOPOROSIS, MGUS, DEMENTIA.

## 2024-09-09 NOTE — Assessment & Plan Note (Signed)
 Chronic, progressive.  Continue Aricept daily and collaboration with neurology.  Recent notes reviewed. Continue OT and PT as needed, she does well with this.

## 2024-09-10 ENCOUNTER — Ambulatory Visit: Payer: Self-pay | Admitting: Nurse Practitioner

## 2024-09-10 LAB — COMPREHENSIVE METABOLIC PANEL WITH GFR
ALT: 8 IU/L (ref 0–32)
AST: 21 IU/L (ref 0–40)
Albumin: 4 g/dL (ref 3.7–4.7)
Alkaline Phosphatase: 79 IU/L (ref 48–129)
BUN/Creatinine Ratio: 12 (ref 12–28)
BUN: 13 mg/dL (ref 8–27)
Bilirubin Total: 1 mg/dL (ref 0.0–1.2)
CO2: 26 mmol/L (ref 20–29)
Calcium: 9.6 mg/dL (ref 8.7–10.3)
Chloride: 102 mmol/L (ref 96–106)
Creatinine, Ser: 1.07 mg/dL — ABNORMAL HIGH (ref 0.57–1.00)
Globulin, Total: 2.5 g/dL (ref 1.5–4.5)
Glucose: 101 mg/dL — ABNORMAL HIGH (ref 70–99)
Potassium: 3.7 mmol/L (ref 3.5–5.2)
Sodium: 140 mmol/L (ref 134–144)
Total Protein: 6.5 g/dL (ref 6.0–8.5)
eGFR: 50 mL/min/1.73 — ABNORMAL LOW (ref >59)

## 2024-09-10 LAB — CBC WITH DIFFERENTIAL/PLATELET
Basophils Absolute: 0 x10E3/uL (ref 0.0–0.2)
Basos: 0 %
EOS (ABSOLUTE): 0.1 x10E3/uL (ref 0.0–0.4)
Eos: 2 %
Hematocrit: 39 % (ref 34.0–46.6)
Hemoglobin: 12.5 g/dL (ref 11.1–15.9)
Immature Grans (Abs): 0 x10E3/uL (ref 0.0–0.1)
Immature Granulocytes: 0 %
Lymphocytes Absolute: 1.5 x10E3/uL (ref 0.7–3.1)
Lymphs: 30 %
MCH: 30.4 pg (ref 26.6–33.0)
MCHC: 32.1 g/dL (ref 31.5–35.7)
MCV: 95 fL (ref 79–97)
Monocytes Absolute: 0.6 x10E3/uL (ref 0.1–0.9)
Monocytes: 11 %
Neutrophils Absolute: 3 x10E3/uL (ref 1.4–7.0)
Neutrophils: 57 %
Platelets: 164 x10E3/uL (ref 150–450)
RBC: 4.11 x10E6/uL (ref 3.77–5.28)
RDW: 14.6 % (ref 11.7–15.4)
WBC: 5.2 x10E3/uL (ref 3.4–10.8)

## 2024-09-10 LAB — LIPID PANEL W/O CHOL/HDL RATIO
Cholesterol, Total: 205 mg/dL — ABNORMAL HIGH (ref 100–199)
HDL: 87 mg/dL (ref >39)
LDL Chol Calc (NIH): 102 mg/dL — ABNORMAL HIGH (ref 0–99)
Triglycerides: 93 mg/dL (ref 0–149)
VLDL Cholesterol Cal: 16 mg/dL (ref 5–40)

## 2024-09-10 LAB — URIC ACID: Uric Acid: 4.2 mg/dL (ref 3.1–7.9)

## 2024-09-10 NOTE — Progress Notes (Signed)
 Contacted via MyChart  Good day Classie and family, labs have returned: - Lipid panel showing some elevations, continue focus on healthy diet and regular activity. - Kidney function, eGFR and creatinine, continues to show Stage 3a kidney disease with no worsening, ensure good hydration daily.  Liver function, AST and ALT, normal. - Remainder of labs all stable.  Any questions? Keep being stellar!!  Thank you for allowing me to participate in your care.  I appreciate you. Kindest regards, Adleigh Mcmasters

## 2024-12-26 ENCOUNTER — Encounter: Payer: Self-pay | Admitting: Nurse Practitioner

## 2025-01-02 ENCOUNTER — Telehealth: Payer: Self-pay | Admitting: Nurse Practitioner

## 2025-01-02 ENCOUNTER — Other Ambulatory Visit: Payer: Self-pay | Admitting: Nurse Practitioner

## 2025-01-02 MED ORDER — ALENDRONATE SODIUM 70 MG PO TABS: 70.0000 mg | ORAL_TABLET | ORAL | 0 refills | Status: AC

## 2025-01-02 NOTE — Telephone Encounter (Signed)
 Requested Prescriptions  Pending Prescriptions Disp Refills   alendronate  (FOSAMAX ) 70 MG tablet 12 tablet 0    Sig: Take 1 tablet (70 mg total) by mouth once a week. Take with a full glass of water on an empty stomach.     Endocrinology:  Bisphosphonates Failed - 01/02/2025  1:54 PM      Failed - Cr in normal range and within 360 days    Creatinine  Date Value Ref Range Status  02/23/2024 1.02 (H) 0.44 - 1.00 mg/dL Final   Creatinine, Ser  Date Value Ref Range Status  09/09/2024 1.07 (H) 0.57 - 1.00 mg/dL Final         Failed - Mg Level in normal range and within 360 days    Magnesium  Date Value Ref Range Status  09/27/2022 1.9 1.6 - 2.3 mg/dL Final         Failed - Phosphate in normal range and within 360 days    No results found for: PHOS       Failed - Bone Mineral Density or Dexa Scan completed in the last 2 years      Passed - Ca in normal range and within 360 days    Calcium  Date Value Ref Range Status  09/09/2024 9.6 8.7 - 10.3 mg/dL Final         Passed - Vitamin D  in normal range and within 360 days    Vit D, 25-Hydroxy  Date Value Ref Range Status  03/06/2024 57.6 30.0 - 100.0 ng/mL Final    Comment:    Vitamin D  deficiency has been defined by the Institute of Medicine and an Endocrine Society practice guideline as a level of serum 25-OH vitamin D  less than 20 ng/mL (1,2). The Endocrine Society went on to further define vitamin D  insufficiency as a level between 21 and 29 ng/mL (2). 1. IOM (Institute of Medicine). 2010. Dietary reference    intakes for calcium and D. [place]: The    [employer]. 2. Holick MF, Binkley [place], Bischoff-Ferrari HA, et al.    Evaluation, treatment, and prevention of vitamin D     deficiency: an Endocrine Society clinical practice    guideline. JCEM. 2011 Jul; 96(7):1911-30.          Passed - eGFR is 30 or above and within 360 days    GFR calc Af Amer  Date Value Ref Range Status  08/12/2020 50 (L) >60  mL/min Final   GFR, Estimated  Date Value Ref Range Status  02/23/2024 53 (L) >60 mL/min Final    Comment:    (NOTE) Calculated using the CKD-EPI Creatinine Equation (2021)    eGFR  Date Value Ref Range Status  09/09/2024 50 (L) >59 mL/min/1.73 Final         Passed - Valid encounter within last 12 months    Recent Outpatient Visits           3 months ago Alzheimer disease (HCC)   [place] [facility] [place], Melanie T, NP   4 months ago Lab test positive for detection of COVID-19 virus   [place] [facility] [place], Melanie T, NP   6 months ago Poison ivy   [place] [facility] Melvin Pao, NP   10 months ago Alzheimer disease [facility])   [place] [facility] [place], Melanie DASEN, NP

## 2025-01-02 NOTE — Telephone Encounter (Signed)
 Copied from CRM [id number]. Topic: Clinical - Medication Refill >> Jan 02, 2025  9:29 AM Darshell M wrote: Medication:  alendronate  (FOSAMAX ) 70 MG tablet  Patient requesting 90 day supply due to policy change with [facility]. Patient was advised by [facility] to have provider write a new prescription   Has the patient contacted their pharmacy? Yes (Agent: If no, request that the patient contact the pharmacy for the refill. If patient does not wish to contact the pharmacy document the reason why and proceed with request.) (Agent: If yes, when and what did the pharmacy advise?)  This is the patient's preferred pharmacy:  [facility] - [place], [place] - [address] [place] [address] Phone: [phone] Fax: [phone]  Is this the correct pharmacy for this prescription? Yes If no, delete pharmacy and type the correct one.   Has the prescription been filled recently? No  Is the patient out of the medication? No  Has the patient been seen for an appointment in the last year OR does the patient have an upcoming appointment? Yes  Can we respond through MyChart? Yes  Agent: Please be advised that Rx refills may take up to 3 business days. We ask that you follow-up with your pharmacy.

## 2025-01-15 ENCOUNTER — Telehealth: Payer: Self-pay | Admitting: Nurse Practitioner

## 2025-01-15 NOTE — Telephone Encounter (Unsigned)
 Copied from CRM [id number]. Topic: General - Other >> Jan 15, 2025 11:49 AM Wess RAMAN wrote: Reason for CRM: Selinda from [employer] would like to know if the fax was received. Front desk confirmed it may not be processed yet.   Callback #: [phone] Fax #: [phone]

## 2025-01-15 NOTE — Telephone Encounter (Unsigned)
 Copied from CRM [id number]. Topic: General - Other >> Jan 08, 2025 10:26 AM Drema MATSU wrote: Reason for CRM: [facility] stated that he will be sending a request for pt. He is wanting provider to review sign and send back with office notes. He is needing it as soon as possible. HIPPA form >> Jan 15, 2025 11:46 AM Iris P wrote: Noted

## 2025-01-16 NOTE — Telephone Encounter (Signed)
 Fax received. Placed call to the patient's daughter to ask about this fax and order. Daughter Olam states that the patient did not request this and that it was a scam. Daughter thanked me for reaching out and checking on this. Will shred the form that was received.

## 2025-01-16 NOTE — Telephone Encounter (Signed)
 Called over to [employer] and spoke with [place]. Advised him that we have not received any forms from them and confirmed our fax number. Selinda states he is resending the fax. I asked Selinda what kind of medical supplies this was for and he states it is an orthotic brace that the patient requested from them.   Will reach out to patient to confirm that she did request this from them.

## 2025-01-17 NOTE — Telephone Encounter (Unsigned)
 Copied from CRM [id number]. Topic: General - Other >> Jan 17, 2025 11:23 AM Willma R wrote: Reason for CRM: [place]ford from [employer] calling to verify the fax date when the order was sent and the day the dr signed it for them to send out  right elbow orthosis.   Medford can be reached at [phone]

## 2025-01-17 NOTE — Telephone Encounter (Signed)
 Returned call to [place] and informed him that we would not be signing this form as the patient and her family did not want this.

## 2025-03-07 ENCOUNTER — Other Ambulatory Visit

## 2025-03-07 ENCOUNTER — Ambulatory Visit: Admitting: Nurse Practitioner

## 2025-03-10 ENCOUNTER — Ambulatory Visit: Admitting: Nurse Practitioner

## 2025-03-17 ENCOUNTER — Ambulatory Visit: Admitting: Oncology

## 2025-08-26 ENCOUNTER — Ambulatory Visit
